# Patient Record
Sex: Male | Born: 1935 | Race: Black or African American | Hispanic: No | Marital: Married | Smoking: Former smoker
Health system: Southern US, Community
[De-identification: ages and names within clinical notes are randomized; demographics above are authoritative.]

## PROBLEM LIST (undated history)

## (undated) DIAGNOSIS — I739 Peripheral vascular disease, unspecified: Secondary | ICD-10-CM

## (undated) DIAGNOSIS — C61 Malignant neoplasm of prostate: Secondary | ICD-10-CM

## (undated) DIAGNOSIS — I10 Essential (primary) hypertension: Secondary | ICD-10-CM

## (undated) DIAGNOSIS — D509 Iron deficiency anemia, unspecified: Secondary | ICD-10-CM

## (undated) DIAGNOSIS — I453 Trifascicular block: Secondary | ICD-10-CM

## (undated) DIAGNOSIS — W19XXXA Unspecified fall, initial encounter: Secondary | ICD-10-CM

## (undated) DIAGNOSIS — E538 Deficiency of other specified B group vitamins: Secondary | ICD-10-CM

## (undated) DIAGNOSIS — I5032 Chronic diastolic (congestive) heart failure: Secondary | ICD-10-CM

## (undated) DIAGNOSIS — E039 Hypothyroidism, unspecified: Secondary | ICD-10-CM

## (undated) DIAGNOSIS — R296 Repeated falls: Secondary | ICD-10-CM

## (undated) HISTORY — PX: CHOLECYSTECTOMY: SHX55

## (undated) HISTORY — DX: Malignant neoplasm of prostate: C61

## (undated) HISTORY — DX: Hypothyroidism, unspecified: E03.9

## (undated) HISTORY — DX: Peripheral vascular disease, unspecified: I73.9

## (undated) HISTORY — DX: Essential (primary) hypertension: I10

## (undated) HISTORY — PX: HIP SURGERY: SHX245

---

## 1998-08-27 ENCOUNTER — Encounter: Payer: Self-pay | Admitting: Endocrinology

## 1998-08-27 ENCOUNTER — Ambulatory Visit (HOSPITAL_COMMUNITY): Admission: RE | Admit: 1998-08-27 | Discharge: 1998-08-27 | Payer: Self-pay | Admitting: Endocrinology

## 1998-09-02 ENCOUNTER — Ambulatory Visit: Admission: RE | Admit: 1998-09-02 | Discharge: 1998-09-02 | Payer: Self-pay | Admitting: Specialist

## 1998-09-02 ENCOUNTER — Encounter: Payer: Self-pay | Admitting: Specialist

## 1998-09-03 ENCOUNTER — Ambulatory Visit (HOSPITAL_COMMUNITY): Admission: RE | Admit: 1998-09-03 | Discharge: 1998-09-03 | Payer: Self-pay | Admitting: Specialist

## 1998-09-03 ENCOUNTER — Encounter: Payer: Self-pay | Admitting: Specialist

## 1999-08-20 ENCOUNTER — Observation Stay (HOSPITAL_COMMUNITY): Admission: RE | Admit: 1999-08-20 | Discharge: 1999-08-21 | Payer: Self-pay | Admitting: Specialist

## 2000-03-28 ENCOUNTER — Encounter: Payer: Self-pay | Admitting: Orthopedic Surgery

## 2000-04-04 ENCOUNTER — Inpatient Hospital Stay (HOSPITAL_COMMUNITY): Admission: RE | Admit: 2000-04-04 | Discharge: 2000-04-09 | Payer: Self-pay | Admitting: Orthopedic Surgery

## 2000-04-04 ENCOUNTER — Encounter: Payer: Self-pay | Admitting: Orthopedic Surgery

## 2000-11-23 ENCOUNTER — Emergency Department (HOSPITAL_COMMUNITY): Admission: EM | Admit: 2000-11-23 | Discharge: 2000-11-23 | Payer: Self-pay | Admitting: Emergency Medicine

## 2000-11-23 ENCOUNTER — Encounter: Payer: Self-pay | Admitting: Emergency Medicine

## 2004-03-31 ENCOUNTER — Encounter: Admission: RE | Admit: 2004-03-31 | Discharge: 2004-03-31 | Payer: Self-pay | Admitting: Orthopedic Surgery

## 2005-03-09 ENCOUNTER — Ambulatory Visit: Payer: Self-pay | Admitting: Internal Medicine

## 2005-03-09 ENCOUNTER — Ambulatory Visit: Payer: Self-pay | Admitting: Cardiology

## 2005-03-19 ENCOUNTER — Ambulatory Visit (HOSPITAL_COMMUNITY): Admission: RE | Admit: 2005-03-19 | Discharge: 2005-03-19 | Payer: Self-pay | Admitting: Internal Medicine

## 2005-05-05 ENCOUNTER — Ambulatory Visit: Admission: RE | Admit: 2005-05-05 | Discharge: 2005-06-08 | Payer: Self-pay | Admitting: Radiation Oncology

## 2005-06-02 ENCOUNTER — Ambulatory Visit: Payer: Self-pay | Admitting: Internal Medicine

## 2005-07-07 ENCOUNTER — Ambulatory Visit: Admission: RE | Admit: 2005-07-07 | Discharge: 2005-10-05 | Payer: Self-pay | Admitting: Radiation Oncology

## 2005-08-27 ENCOUNTER — Ambulatory Visit: Payer: Self-pay | Admitting: Internal Medicine

## 2005-10-06 ENCOUNTER — Ambulatory Visit: Admission: RE | Admit: 2005-10-06 | Discharge: 2005-12-06 | Payer: Self-pay | Admitting: Radiation Oncology

## 2006-02-14 ENCOUNTER — Ambulatory Visit: Payer: Self-pay | Admitting: Internal Medicine

## 2006-04-13 ENCOUNTER — Ambulatory Visit: Payer: Self-pay | Admitting: Internal Medicine

## 2006-05-16 ENCOUNTER — Ambulatory Visit: Payer: Self-pay | Admitting: Internal Medicine

## 2006-08-18 ENCOUNTER — Ambulatory Visit: Payer: Self-pay | Admitting: Internal Medicine

## 2006-10-11 ENCOUNTER — Ambulatory Visit: Payer: Self-pay | Admitting: Internal Medicine

## 2006-10-11 LAB — CONVERTED CEMR LAB
ALT: 37 units/L (ref 0–40)
AST: 37 units/L (ref 0–37)
Albumin: 3.7 g/dL (ref 3.5–5.2)
Alkaline Phosphatase: 59 units/L (ref 39–117)
BUN: 23 mg/dL (ref 6–23)
Basophils Absolute: 0 10*3/uL (ref 0.0–0.1)
Basophils Relative: 0.8 % (ref 0.0–1.0)
CO2: 26 meq/L (ref 19–32)
Calcium: 9.5 mg/dL (ref 8.4–10.5)
Chloride: 108 meq/L (ref 96–112)
Creatinine, Ser: 1.6 mg/dL — ABNORMAL HIGH (ref 0.4–1.5)
Eosinophil percent: 4.7 % (ref 0.0–5.0)
GFR calc non Af Amer: 45 mL/min
Glomerular Filtration Rate, Af Am: 55 mL/min/{1.73_m2}
Glucose, Bld: 113 mg/dL — ABNORMAL HIGH (ref 70–99)
HCT: 30.7 % — ABNORMAL LOW (ref 39.0–52.0)
Hemoglobin: 9.6 g/dL — ABNORMAL LOW (ref 13.0–17.0)
Lymphocytes Relative: 26.3 % (ref 12.0–46.0)
MCHC: 31.2 g/dL (ref 30.0–36.0)
MCV: 80 fL (ref 78.0–100.0)
Monocytes Absolute: 0.6 10*3/uL (ref 0.2–0.7)
Monocytes Relative: 10.4 % (ref 3.0–11.0)
Neutro Abs: 3.1 10*3/uL (ref 1.4–7.7)
Neutrophils Relative %: 57.8 % (ref 43.0–77.0)
Platelets: 249 10*3/uL (ref 150–400)
Potassium: 4.6 meq/L (ref 3.5–5.1)
RBC: 3.83 M/uL — ABNORMAL LOW (ref 4.22–5.81)
RDW: 14.1 % (ref 11.5–14.6)
Sodium: 140 meq/L (ref 135–145)
TSH: 3.36 microintl units/mL (ref 0.35–5.50)
Total Bilirubin: 1 mg/dL (ref 0.3–1.2)
Total Protein: 7.3 g/dL (ref 6.0–8.3)
WBC: 5.3 10*3/uL (ref 4.5–10.5)

## 2006-10-26 ENCOUNTER — Ambulatory Visit: Payer: Self-pay

## 2006-10-26 ENCOUNTER — Encounter: Payer: Self-pay | Admitting: Cardiology

## 2006-10-27 ENCOUNTER — Emergency Department (HOSPITAL_COMMUNITY): Admission: EM | Admit: 2006-10-27 | Discharge: 2006-10-27 | Payer: Self-pay | Admitting: Emergency Medicine

## 2006-11-03 ENCOUNTER — Ambulatory Visit: Payer: Self-pay | Admitting: Internal Medicine

## 2006-11-04 ENCOUNTER — Ambulatory Visit: Payer: Self-pay | Admitting: Internal Medicine

## 2006-11-04 LAB — CONVERTED CEMR LAB
Basophils Absolute: 0 10*3/uL (ref 0.0–0.1)
Basophils Relative: 1 % (ref 0.0–1.0)
Eosinophil percent: 2.6 % (ref 0.0–5.0)
Folate: >20 ng/mL
H Pylori IgG: POSITIVE — AB
HCT: 30.6 % — ABNORMAL LOW (ref 39.0–52.0)
Hemoglobin: 9.8 g/dL — ABNORMAL LOW (ref 13.0–17.0)
Iron: 34 ug/dL — ABNORMAL LOW (ref 42–165)
Lymphocytes Relative: 32.1 % (ref 12.0–46.0)
MCHC: 32 g/dL (ref 30.0–36.0)
MCV: 80.2 fL (ref 78.0–100.0)
Monocytes Absolute: 0.4 10*3/uL (ref 0.2–0.7)
Monocytes Relative: 11.1 % — ABNORMAL HIGH (ref 3.0–11.0)
Neutro Abs: 2 10*3/uL (ref 1.4–7.7)
Neutrophils Relative %: 53.2 % (ref 43.0–77.0)
Platelets: 242 10*3/uL (ref 150–400)
RBC: 3.82 M/uL — ABNORMAL LOW (ref 4.22–5.81)
RDW: 14.3 % (ref 11.5–14.6)
Saturation Ratios: 7.1 % — ABNORMAL LOW (ref 20.0–50.0)
Transferrin: 340 mg/dL (ref >=212.0)
Vitamin B-12: 478 pg/mL (ref 211–911)
WBC: 3.7 10*3/uL — ABNORMAL LOW (ref 4.5–10.5)

## 2006-11-15 ENCOUNTER — Ambulatory Visit: Payer: Self-pay | Admitting: Gastroenterology

## 2006-11-16 ENCOUNTER — Ambulatory Visit: Payer: Self-pay | Admitting: Cardiology

## 2006-11-16 ENCOUNTER — Ambulatory Visit: Payer: Self-pay | Admitting: Gastroenterology

## 2006-11-22 ENCOUNTER — Ambulatory Visit: Payer: Self-pay | Admitting: Cardiology

## 2006-11-25 ENCOUNTER — Ambulatory Visit: Payer: Self-pay

## 2006-11-28 ENCOUNTER — Ambulatory Visit: Payer: Self-pay | Admitting: Gastroenterology

## 2006-11-28 LAB — CONVERTED CEMR LAB
Fecal Occult Blood: NEGATIVE
OCCULT 1: NEGATIVE
OCCULT 2: NEGATIVE
OCCULT 3: NEGATIVE
OCCULT 4: NEGATIVE
OCCULT 5: NEGATIVE

## 2006-11-29 ENCOUNTER — Encounter (INDEPENDENT_AMBULATORY_CARE_PROVIDER_SITE_OTHER): Payer: Self-pay | Admitting: *Deleted

## 2006-11-29 ENCOUNTER — Ambulatory Visit (HOSPITAL_COMMUNITY): Admission: RE | Admit: 2006-11-29 | Discharge: 2006-11-30 | Payer: Self-pay | Admitting: General Surgery

## 2006-12-19 ENCOUNTER — Ambulatory Visit: Payer: Self-pay | Admitting: Gastroenterology

## 2006-12-19 LAB — CONVERTED CEMR LAB
Basophils Absolute: 0 10*3/uL (ref 0.0–0.1)
Basophils Relative: 0.7 % (ref 0.0–1.0)
Eosinophils Absolute: 0.1 10*3/uL (ref 0.0–0.6)
Eosinophils Relative: 2.8 % (ref 0.0–5.0)
HCT: 37 % — ABNORMAL LOW (ref 39.0–52.0)
Hemoglobin: 12.3 g/dL — ABNORMAL LOW (ref 13.0–17.0)
Iron: 177 ug/dL — ABNORMAL HIGH (ref 42–165)
Lymphocytes Relative: 36.3 % (ref 12.0–46.0)
MCHC: 33.2 g/dL (ref 30.0–36.0)
MCV: 83 fL (ref 78.0–100.0)
Monocytes Absolute: 0.5 10*3/uL (ref 0.2–0.7)
Monocytes Relative: 12.5 % — ABNORMAL HIGH (ref 3.0–11.0)
Neutro Abs: 1.9 10*3/uL (ref 1.4–7.7)
Neutrophils Relative %: 47.7 % (ref 43.0–77.0)
Platelets: 204 10*3/uL (ref 150–400)
RBC: 4.46 M/uL (ref 4.22–5.81)
RDW: 19.8 % — ABNORMAL HIGH (ref 11.5–14.6)
Saturation Ratios: 46.4 % (ref 20.0–50.0)
Transferrin: 272.6 mg/dL (ref >=212.0)
WBC: 3.9 10*3/uL — ABNORMAL LOW (ref 4.5–10.5)

## 2006-12-29 ENCOUNTER — Ambulatory Visit: Payer: Self-pay | Admitting: Cardiology

## 2007-01-17 ENCOUNTER — Ambulatory Visit: Payer: Self-pay | Admitting: Internal Medicine

## 2007-01-25 ENCOUNTER — Ambulatory Visit: Payer: Self-pay

## 2007-01-25 ENCOUNTER — Ambulatory Visit: Payer: Self-pay | Admitting: Cardiology

## 2007-01-31 ENCOUNTER — Encounter (INDEPENDENT_AMBULATORY_CARE_PROVIDER_SITE_OTHER): Payer: Self-pay | Admitting: *Deleted

## 2007-01-31 ENCOUNTER — Ambulatory Visit (HOSPITAL_BASED_OUTPATIENT_CLINIC_OR_DEPARTMENT_OTHER): Admission: RE | Admit: 2007-01-31 | Discharge: 2007-01-31 | Payer: Self-pay | Admitting: Otolaryngology

## 2007-03-15 ENCOUNTER — Encounter: Payer: Self-pay | Admitting: Cardiovascular Disease

## 2007-03-15 ENCOUNTER — Ambulatory Visit: Payer: Self-pay

## 2007-03-24 ENCOUNTER — Ambulatory Visit: Payer: Self-pay | Admitting: Gastroenterology

## 2007-03-24 LAB — CONVERTED CEMR LAB
Basophils Absolute: 0 10*3/uL (ref 0.0–0.1)
Basophils Relative: 0.5 % (ref 0.0–1.0)
Eosinophils Absolute: 0.1 10*3/uL (ref 0.0–0.6)
Eosinophils Relative: 2.9 % (ref 0.0–5.0)
HCT: 38.3 % — ABNORMAL LOW (ref 39.0–52.0)
Hemoglobin: 13.1 g/dL (ref 13.0–17.0)
Lymphocytes Relative: 36.6 % (ref 12.0–46.0)
MCHC: 34.1 g/dL (ref 30.0–36.0)
MCV: 91.1 fL (ref 78.0–100.0)
Monocytes Absolute: 0.4 10*3/uL (ref 0.2–0.7)
Monocytes Relative: 9.4 % (ref 3.0–11.0)
Neutro Abs: 2 10*3/uL (ref 1.4–7.7)
Neutrophils Relative %: 50.6 % (ref 43.0–77.0)
Platelets: 165 10*3/uL (ref 150–400)
RBC: 4.2 M/uL — ABNORMAL LOW (ref 4.22–5.81)
RDW: 13.6 % (ref 11.5–14.6)
WBC: 3.9 10*3/uL — ABNORMAL LOW (ref 4.5–10.5)

## 2007-04-28 ENCOUNTER — Ambulatory Visit: Payer: Self-pay | Admitting: Internal Medicine

## 2007-06-06 ENCOUNTER — Ambulatory Visit: Payer: Self-pay | Admitting: Cardiology

## 2007-06-13 IMAGING — RF DG CHOLANGIOGRAM OPERATIVE
1 series · 4 of 4 positions shown · non-contrast
Comparison: none

CLINICAL DATA: Cholelithiasis

[Series 1: run · 4 of 74 frames shown]
[frame 12/74]
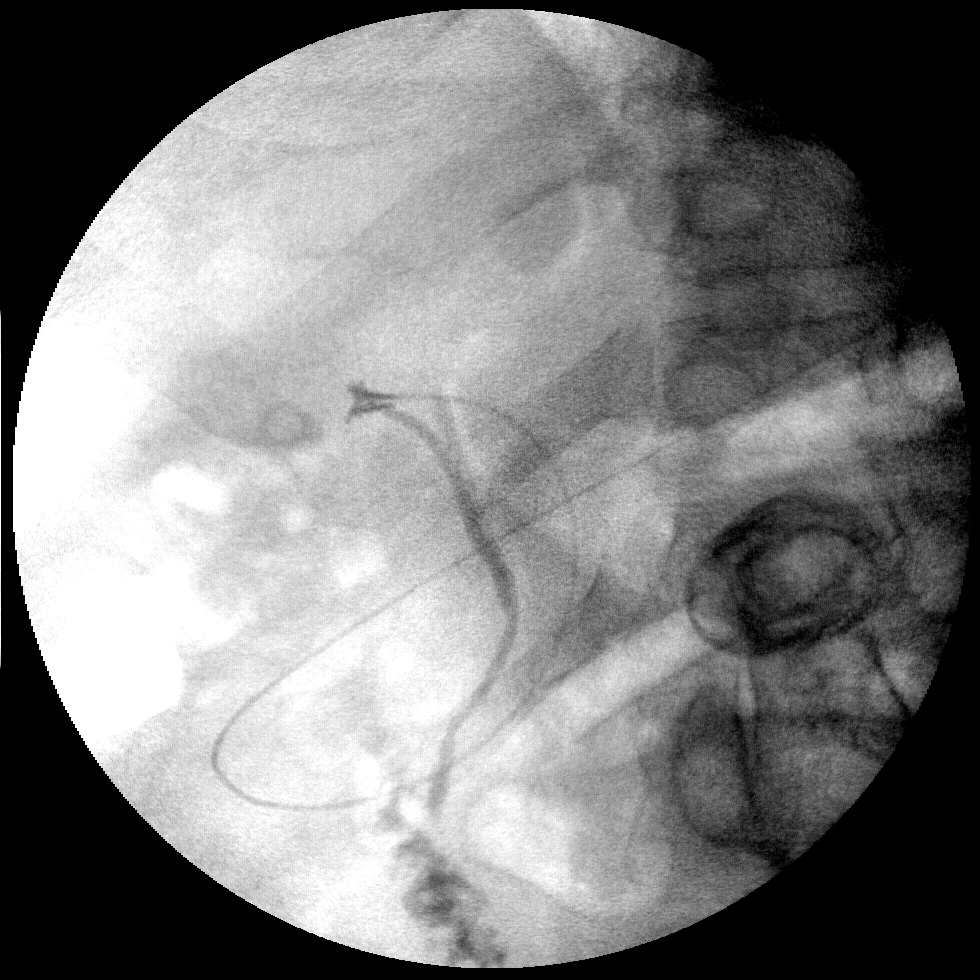
[frame 38/74]
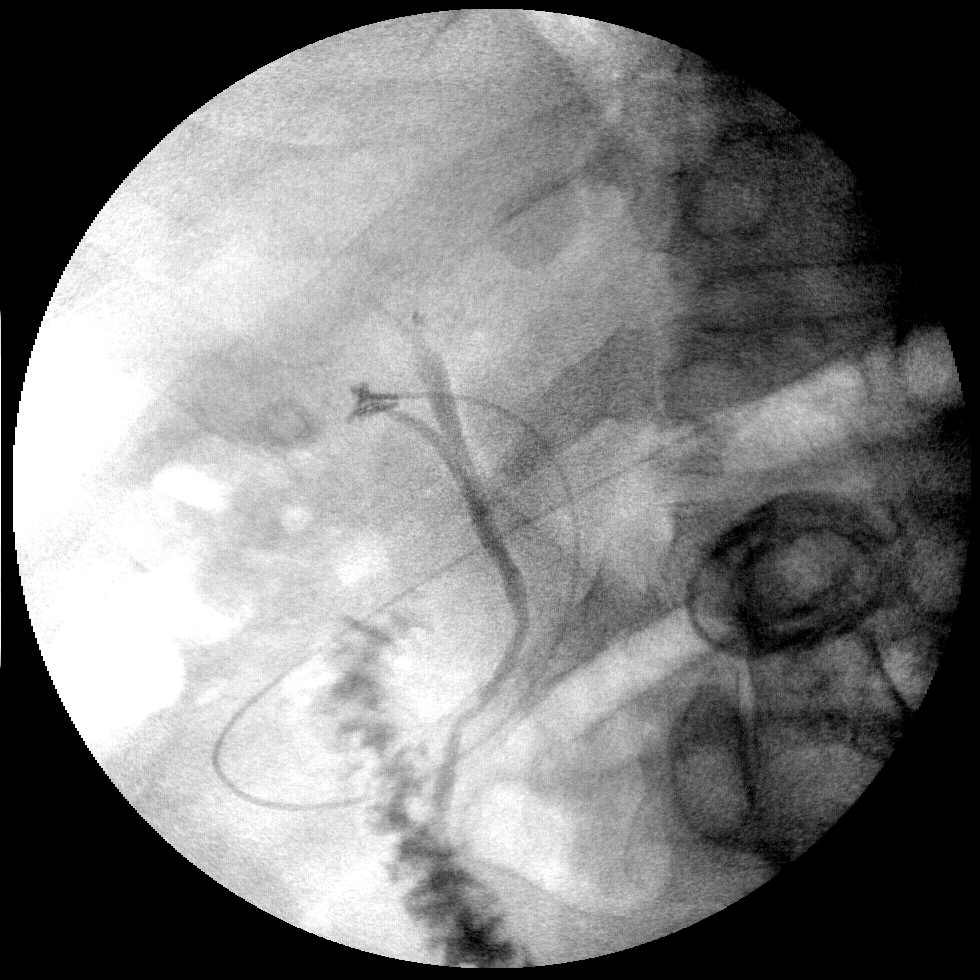
[frame 61/74]
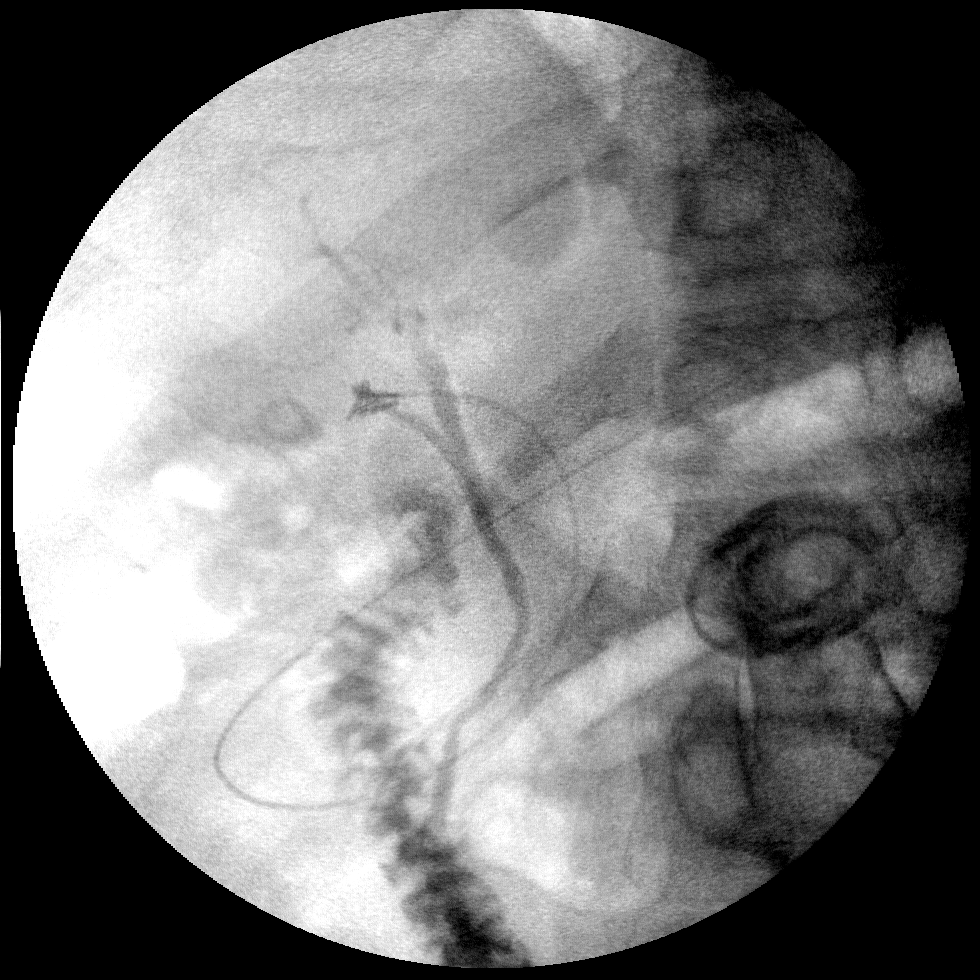
[frame 63/74]
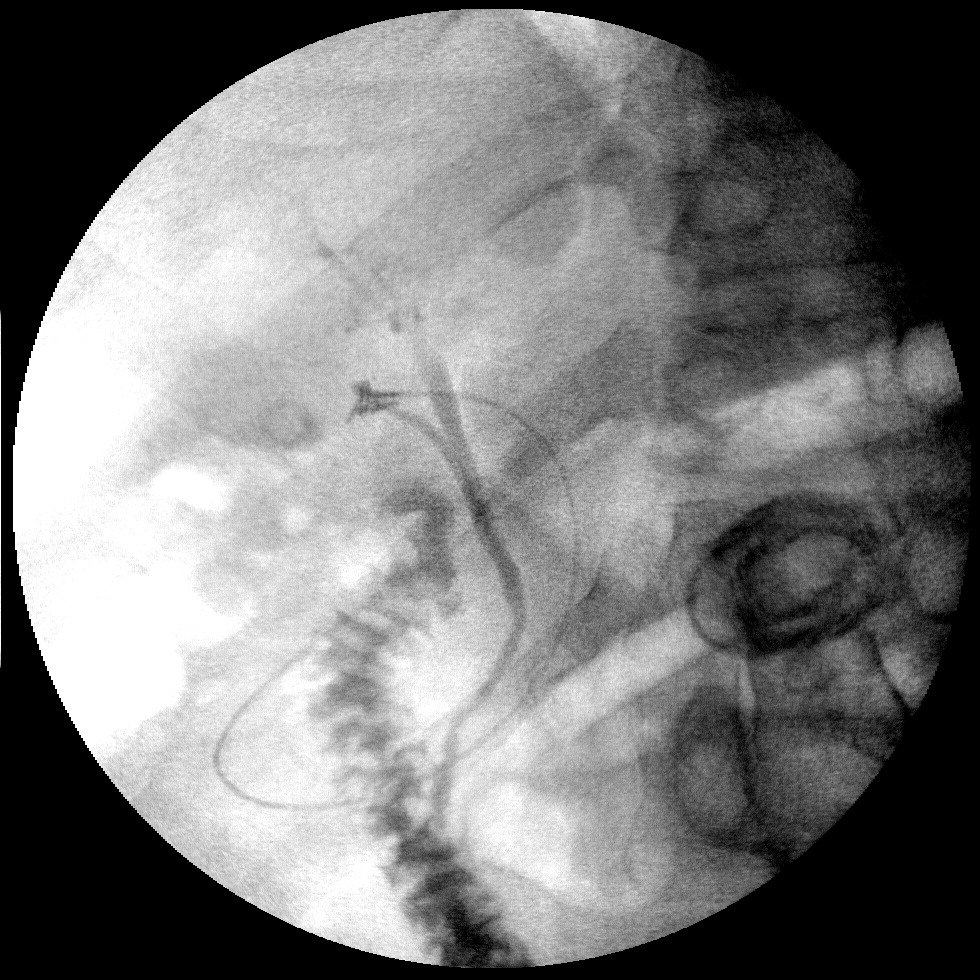

[4 of 4 positions shown; findings below may reference images not displayed]

INTRAOPERATIVE CHOLANGIOGRAM:

74  images from intraoperative C-arm fluoroscopy demonstrate  opacification of
the common bile duct. No filling defects to suggest retained stones. There is
incomplete evaluation of intrahepatic biliary tree, which appears decompressed
centrally. Contrast appears to flow on into decompressed duodenum.
IMPRESSION: 1. Negative for retained common duct stone

## 2007-06-30 ENCOUNTER — Ambulatory Visit: Payer: Self-pay | Admitting: Internal Medicine

## 2007-08-10 ENCOUNTER — Ambulatory Visit: Payer: Self-pay | Admitting: Internal Medicine

## 2007-09-05 ENCOUNTER — Ambulatory Visit: Payer: Self-pay | Admitting: Internal Medicine

## 2007-09-05 DIAGNOSIS — R220 Localized swelling, mass and lump, head: Secondary | ICD-10-CM | POA: Insufficient documentation

## 2007-09-05 DIAGNOSIS — I498 Other specified cardiac arrhythmias: Secondary | ICD-10-CM | POA: Insufficient documentation

## 2007-09-05 DIAGNOSIS — R81 Glycosuria: Secondary | ICD-10-CM | POA: Insufficient documentation

## 2007-09-05 DIAGNOSIS — D509 Iron deficiency anemia, unspecified: Secondary | ICD-10-CM | POA: Insufficient documentation

## 2007-09-05 DIAGNOSIS — R221 Localized swelling, mass and lump, neck: Secondary | ICD-10-CM

## 2007-09-05 DIAGNOSIS — Z8679 Personal history of other diseases of the circulatory system: Secondary | ICD-10-CM | POA: Insufficient documentation

## 2007-09-05 DIAGNOSIS — Z87898 Personal history of other specified conditions: Secondary | ICD-10-CM | POA: Insufficient documentation

## 2007-09-05 DIAGNOSIS — K219 Gastro-esophageal reflux disease without esophagitis: Secondary | ICD-10-CM | POA: Insufficient documentation

## 2007-09-05 DIAGNOSIS — E785 Hyperlipidemia, unspecified: Secondary | ICD-10-CM | POA: Insufficient documentation

## 2007-09-05 DIAGNOSIS — C61 Malignant neoplasm of prostate: Secondary | ICD-10-CM | POA: Insufficient documentation

## 2007-09-05 DIAGNOSIS — E039 Hypothyroidism, unspecified: Secondary | ICD-10-CM | POA: Insufficient documentation

## 2007-09-05 DIAGNOSIS — F411 Generalized anxiety disorder: Secondary | ICD-10-CM | POA: Insufficient documentation

## 2007-09-05 DIAGNOSIS — G43009 Migraine without aura, not intractable, without status migrainosus: Secondary | ICD-10-CM | POA: Insufficient documentation

## 2007-09-05 DIAGNOSIS — F528 Other sexual dysfunction not due to a substance or known physiological condition: Secondary | ICD-10-CM | POA: Insufficient documentation

## 2007-09-05 DIAGNOSIS — Z8639 Personal history of other endocrine, nutritional and metabolic disease: Secondary | ICD-10-CM | POA: Insufficient documentation

## 2007-09-05 DIAGNOSIS — F329 Major depressive disorder, single episode, unspecified: Secondary | ICD-10-CM | POA: Insufficient documentation

## 2007-09-05 DIAGNOSIS — I10 Essential (primary) hypertension: Secondary | ICD-10-CM | POA: Insufficient documentation

## 2007-09-05 DIAGNOSIS — Z862 Personal history of diseases of the blood and blood-forming organs and certain disorders involving the immune mechanism: Secondary | ICD-10-CM

## 2007-09-05 DIAGNOSIS — R6882 Decreased libido: Secondary | ICD-10-CM | POA: Insufficient documentation

## 2007-09-05 LAB — CONVERTED CEMR LAB
ALT: 53 units/L (ref 0–53)
AST: 49 units/L — ABNORMAL HIGH (ref 0–37)
Albumin: 4.3 g/dL (ref 3.5–5.2)
Alkaline Phosphatase: 52 units/L (ref 39–117)
BUN: 13 mg/dL (ref 6–23)
Basophils Absolute: 0 10*3/uL (ref 0.0–0.1)
Basophils Relative: 0.8 % (ref 0.0–1.0)
Bilirubin Urine: NEGATIVE
Bilirubin, Direct: 0.2 mg/dL (ref 0.0–0.3)
CO2: 28 meq/L (ref 19–32)
Calcium: 9.7 mg/dL (ref 8.4–10.5)
Chloride: 103 meq/L (ref 96–112)
Cholesterol: 163 mg/dL (ref 0–200)
Creatinine, Ser: 1.3 mg/dL (ref 0.4–1.5)
Crystals: NEGATIVE
Eosinophils Absolute: 0.2 10*3/uL (ref 0.0–0.6)
Eosinophils Relative: 3.4 % (ref 0.0–5.0)
GFR calc Af Amer: 70 mL/min
GFR calc non Af Amer: 58 mL/min
Glucose, Bld: 125 mg/dL — ABNORMAL HIGH (ref 70–99)
HCT: 41 % (ref 39.0–52.0)
HDL: 48.8 mg/dL (ref >39.0)
Hemoglobin, Urine: NEGATIVE
Hemoglobin: 14.5 g/dL (ref 13.0–17.0)
Hgb A1c MFr Bld: 6.6 % — ABNORMAL HIGH (ref 4.6–6.0)
Ketones, ur: NEGATIVE mg/dL
LDL Cholesterol: 95 mg/dL (ref 0–99)
Leukocytes, UA: NEGATIVE
Lymphocytes Relative: 36 % (ref 12.0–46.0)
MCHC: 35.4 g/dL (ref 30.0–36.0)
MCV: 93.1 fL (ref 78.0–100.0)
Monocytes Absolute: 0.4 10*3/uL (ref 0.2–0.7)
Monocytes Relative: 9.3 % (ref 3.0–11.0)
Mucus, UA: NEGATIVE
Neutro Abs: 2.3 10*3/uL (ref 1.4–7.7)
Neutrophils Relative %: 50.5 % (ref 43.0–77.0)
Nitrite: NEGATIVE
Platelets: 186 10*3/uL (ref 150–400)
Potassium: 4.5 meq/L (ref 3.5–5.1)
RBC / HPF: NONE SEEN
RBC: 4.41 M/uL (ref 4.22–5.81)
RDW: 12.5 % (ref 11.5–14.6)
Sodium: 139 meq/L (ref 135–145)
Specific Gravity, Urine: 1.015 (ref 1.000–1.03)
TSH: 2.95 microintl units/mL (ref 0.35–5.50)
Testosterone: 54.82 ng/dL — ABNORMAL LOW (ref 350.00–890)
Total Bilirubin: 1.4 mg/dL — ABNORMAL HIGH (ref 0.3–1.2)
Total CHOL/HDL Ratio: 3.3
Total Protein, Urine: NEGATIVE mg/dL
Total Protein: 7.7 g/dL (ref 6.0–8.3)
Triglycerides: 98 mg/dL (ref 0–149)
Urine Glucose: NEGATIVE mg/dL
Urobilinogen, UA: 0.2 (ref 0.0–1.0)
VLDL: 20 mg/dL (ref 0–40)
WBC: 4.6 10*3/uL (ref 4.5–10.5)
pH: 7 (ref 5.0–8.0)

## 2007-09-06 ENCOUNTER — Encounter: Payer: Self-pay | Admitting: Internal Medicine

## 2007-12-21 ENCOUNTER — Ambulatory Visit: Payer: Self-pay | Admitting: Cardiology

## 2008-03-05 ENCOUNTER — Ambulatory Visit: Payer: Self-pay | Admitting: Internal Medicine

## 2008-03-05 DIAGNOSIS — H900 Conductive hearing loss, bilateral: Secondary | ICD-10-CM | POA: Insufficient documentation

## 2008-03-05 DIAGNOSIS — E119 Type 2 diabetes mellitus without complications: Secondary | ICD-10-CM | POA: Insufficient documentation

## 2008-03-05 DIAGNOSIS — J309 Allergic rhinitis, unspecified: Secondary | ICD-10-CM | POA: Insufficient documentation

## 2008-03-05 DIAGNOSIS — E291 Testicular hypofunction: Secondary | ICD-10-CM | POA: Insufficient documentation

## 2008-04-12 ENCOUNTER — Encounter: Payer: Self-pay | Admitting: Internal Medicine

## 2008-08-13 ENCOUNTER — Ambulatory Visit: Payer: Self-pay | Admitting: Internal Medicine

## 2008-08-23 ENCOUNTER — Encounter: Payer: Self-pay | Admitting: Internal Medicine

## 2008-12-04 ENCOUNTER — Ambulatory Visit: Payer: Self-pay | Admitting: Cardiology

## 2009-02-21 ENCOUNTER — Encounter: Payer: Self-pay | Admitting: Internal Medicine

## 2009-05-09 ENCOUNTER — Encounter: Payer: Self-pay | Admitting: Cardiology

## 2009-08-25 ENCOUNTER — Ambulatory Visit: Payer: Self-pay | Admitting: Internal Medicine

## 2009-08-25 DIAGNOSIS — R5381 Other malaise: Secondary | ICD-10-CM | POA: Insufficient documentation

## 2009-08-25 DIAGNOSIS — M25519 Pain in unspecified shoulder: Secondary | ICD-10-CM | POA: Insufficient documentation

## 2009-08-25 DIAGNOSIS — R5383 Other fatigue: Secondary | ICD-10-CM

## 2009-08-28 DIAGNOSIS — R42 Dizziness and giddiness: Secondary | ICD-10-CM | POA: Insufficient documentation

## 2009-08-29 ENCOUNTER — Ambulatory Visit: Payer: Self-pay

## 2009-08-29 ENCOUNTER — Ambulatory Visit: Payer: Self-pay | Admitting: Cardiology

## 2009-09-16 ENCOUNTER — Ambulatory Visit: Payer: Self-pay | Admitting: Internal Medicine

## 2009-09-16 LAB — CONVERTED CEMR LAB: TSH: 2.44 microintl units/mL (ref 0.35–5.50)

## 2009-09-17 ENCOUNTER — Encounter (INDEPENDENT_AMBULATORY_CARE_PROVIDER_SITE_OTHER): Payer: Self-pay | Admitting: *Deleted

## 2009-11-19 ENCOUNTER — Encounter: Payer: Self-pay | Admitting: Internal Medicine

## 2010-02-03 ENCOUNTER — Ambulatory Visit: Payer: Self-pay | Admitting: Internal Medicine

## 2010-02-03 ENCOUNTER — Encounter: Payer: Self-pay | Admitting: Cardiology

## 2010-02-24 ENCOUNTER — Ambulatory Visit: Payer: Self-pay | Admitting: Cardiology

## 2010-03-11 ENCOUNTER — Ambulatory Visit: Payer: Self-pay | Admitting: Internal Medicine

## 2010-03-12 ENCOUNTER — Encounter: Payer: Self-pay | Admitting: Internal Medicine

## 2010-04-17 ENCOUNTER — Encounter: Payer: Self-pay | Admitting: Internal Medicine

## 2010-11-22 ENCOUNTER — Encounter: Payer: Self-pay | Admitting: Internal Medicine

## 2010-11-29 LAB — CONVERTED CEMR LAB
ALT: 28 units/L (ref 0–53)
AST: 29 units/L (ref 0–37)
Albumin: 4.3 g/dL (ref 3.5–5.2)
Alkaline Phosphatase: 48 units/L (ref 39–117)
BUN: 17 mg/dL (ref 6–23)
Basophils Absolute: 0.3 10*3/uL — ABNORMAL HIGH (ref 0.0–0.1)
Basophils Relative: 6.4 % — ABNORMAL HIGH (ref 0.0–3.0)
Bilirubin Urine: NEGATIVE
Bilirubin, Direct: 0.2 mg/dL (ref 0.0–0.3)
CO2: 28 meq/L (ref 19–32)
Calcium: 9.1 mg/dL (ref 8.4–10.5)
Chloride: 104 meq/L (ref 96–112)
Cholesterol: 157 mg/dL (ref 0–200)
Creatinine, Ser: 1.3 mg/dL (ref 0.4–1.5)
Eosinophils Absolute: 0.2 10*3/uL (ref 0.0–0.7)
Eosinophils Relative: 3.9 % (ref 0.0–5.0)
Folate: >20 ng/mL
GFR calc non Af Amer: 69.21 mL/min (ref >60)
Glucose, Bld: 86 mg/dL (ref 70–99)
HCT: 44 % (ref 39.0–52.0)
HDL: 48 mg/dL (ref >39.00)
Hemoglobin, Urine: NEGATIVE
Hemoglobin: 15 g/dL (ref 13.0–17.0)
Hgb A1c MFr Bld: 5.7 % (ref 4.6–6.5)
Iron: 73 ug/dL (ref 42–165)
Ketones, ur: NEGATIVE mg/dL
LDL Cholesterol: 88 mg/dL (ref 0–99)
Leukocytes, UA: NEGATIVE
Lymphocytes Relative: 34.4 % (ref 12.0–46.0)
Lymphs Abs: 1.4 10*3/uL (ref 0.7–4.0)
MCHC: 34 g/dL (ref 30.0–36.0)
MCV: 99 fL (ref 78.0–100.0)
Monocytes Absolute: 0.2 10*3/uL (ref 0.1–1.0)
Monocytes Relative: 6.1 % (ref 3.0–12.0)
Neutro Abs: 1.9 10*3/uL (ref 1.4–7.7)
Neutrophils Relative %: 49.2 % (ref 43.0–77.0)
Nitrite: NEGATIVE
Platelets: 140 10*3/uL — ABNORMAL LOW (ref 150.0–400.0)
Potassium: 3.9 meq/L (ref 3.5–5.1)
RBC: 4.44 M/uL (ref 4.22–5.81)
RDW: 12.7 % (ref 11.5–14.6)
Saturation Ratios: 22.3 % (ref 20.0–50.0)
Sed Rate: 10 mm/hr (ref 0–22)
Sodium: 141 meq/L (ref 135–145)
Specific Gravity, Urine: 1.02 (ref 1.000–1.030)
TSH: 25.23 microintl units/mL — ABNORMAL HIGH (ref 0.35–5.50)
Total Bilirubin: 1.5 mg/dL — ABNORMAL HIGH (ref 0.3–1.2)
Total CHOL/HDL Ratio: 3
Total Protein, Urine: NEGATIVE mg/dL
Total Protein: 7.5 g/dL (ref 6.0–8.3)
Transferrin: 233.4 mg/dL (ref 212.0–360.0)
Triglycerides: 107 mg/dL (ref 0.0–149.0)
Urine Glucose: NEGATIVE mg/dL
Urobilinogen, UA: 0.2 (ref 0.0–1.0)
VLDL: 21.4 mg/dL (ref 0.0–40.0)
Vitamin B-12: 528 pg/mL (ref 211–911)
WBC: 4 10*3/uL — ABNORMAL LOW (ref 4.5–10.5)
pH: 6 (ref 5.0–8.0)

## 2010-12-01 NOTE — Assessment & Plan Note (Signed)
Summary: 6 month rov/sl  Medications Added SYNTHROID 125 MCG TABS (LEVOTHYROXINE SODIUM) 1 tab by mouth once daily TERAZOSIN HCL 5 MG CAPS (TERAZOSIN HCL) 1 tab by mouth at bedtime        CC:  dizziness and sob.  History of Present Illness: [name] is a very pleasant  gentleman who I have seen in the past for bradycardia.  A previous monitor has shown that his heart rate occasionally dips into the low 30s with a junctional rhythm, but he did demonstrate chronotropic competence on a previous treadmill.  He has seen  Dr. Ladona Ridgel previously and he felt that a pacemaker may be necessary in the future, but not at present. Previous Myoview in January of 2008 showed no scar or ischemia and his ejection fraction was 68%. An echocardiogram in May 2008 showed normal LV function, mild left atrial enlargement and mild mitral and tricuspid regurgitation. I last saw him in October of 2010. Repeat treadmill in October of 2010 and showed  chronotropic competence; HR increased to 112. Patient did have transient junctional rhythm/junctional tachycardia with exercise; no symptoms. Since I last saw him he has dyspnea and fatigue with activities relieved with rest. This appears to be somewhat worse compared to previous. It is not associated with chest pain. There is no orthopnea, PND, pedal edema or syncope. He occasionally has lightheadedness with standing suddenly but otherwise no dizziness.   Current Medications (verified): 1)  Citalopram Hydrobromide 10 Mg Tabs (Citalopram Hydrobromide) .Marland Kitchen.. 1 Tablet By Mouth Once A Day 2)  Lisinopril 10 Mg Tabs (Lisinopril) .... 1/2 Tab By Mouth Once Daily 3)  Synthroid 125 Mcg Tabs (Levothyroxine Sodium) .Marland Kitchen.. 1 Tab By Mouth Once Daily 4)  Iron Supplement 325 (65 Fe) Mg  Tabs (Ferrous Sulfate) .Marland Kitchen.. 1 By Mouth Qd 5)  Ecotrin 325 Mg  Tbec (Aspirin) .Marland Kitchen.. 1 By Mouth Qd 6)  Simvastatin 40 Mg  Tabs (Simvastatin) .... 1/2 By Mouth Qhs 7)  Alendronate Sodium 70 Mg  Tabs (Alendronate  Sodium) .Marland Kitchen.. 1 By Mouth Q Wk 8)  Omeprazole 20 Mg  Tbec (Omeprazole) .Marland Kitchen.. 1 By Mouth Once Daily 9)  Dulcolax Stool Softener 100 Mg  Caps (Docusate Sodium) .Marland Kitchen.. 1 By Mouth Bid 10)  Fexofenadine Hcl 180 Mg  Tabs (Fexofenadine Hcl) .Marland Kitchen.. 1po Once Daily 11)  Centrum Silver  Tabs (Multiple Vitamins-Minerals) .Marland Kitchen.. 1 Tab By Mouth Once Daily 12)  Tramadol Hcl 50 Mg Tabs (Tramadol Hcl) .Marland Kitchen.. 1 - 2  By Mouth Q 6 Hrs As Needed For Pain 13)  Viagra 100 Mg Tabs (Sildenafil Citrate) .Marland Kitchen.. 1 By Mouth Once Daily As Needed 14)  Terazosin Hcl 5 Mg Caps (Terazosin Hcl) .Marland Kitchen.. 1 Tab By Mouth At Bedtime  Allergies: No Known Drug Allergies  Past History:  Past Medical History: Current Problems:  HYPERTENSION (ICD-401.9) HYPERLIPIDEMIA (ICD-272.4) BRADYCARDIA, CHRONIC (ICD-427.89) CONDUCTIVE HEARING LOSS BILATERAL (ICD-389.06) DIABETES MELLITUS, TYPE II (ICD-250.00) ALLERGIC RHINITIS (ICD-477.9) HYPOGONADISM, MALE (ICD-257.2) VOCAL CORD POLYP, HX OF (ICD-V13.8) COMMON MIGRAINE (ICD-346.10) GLUCOSE INTOLERANCE, HX OF (ICD-V12.2) ERECTILE DYSFUNCTION (ICD-302.72) GERD (ICD-530.81) DEPRESSION (ICD-311) ANXIETY (ICD-300.00) HYPOTHYROIDISM (ICD-244.9) NEOPLASM, MALIGNANT, PROSTATE (ICD-185)     Past Surgical History: Reviewed history from 08/28/2009 and no changes required. Cholecystectomy - 1/08 Rotator cuff repair right hip multiple surguries resulting in some shortnening of the leg Hemorrhoidectomy vocal cord polyp Inguinal herniorrhaphy  Excision of left submandibular gland.  Social History: Reviewed history from 03/05/2008 and no changes required. Former Smoker Alcohol use-no Married  Review of Systems  Fatigue and Arthralgias in the Left Shoulder but no fevers or chills, productive cough, hemoptysis, dysphasia, odynophagia, melena, hematochezia, dysuria, hematuria, rash, seizure activity, orthopnea, PND, pedal edema, claudication. Remaining systems are negative.   Vital  Signs:  Patient profile:   77 year old male Height:      75 inches Weight:      245 pounds BMI:     30.73 Pulse rate:   40 / minute Resp:     12 per minute BP sitting:   128 / 79  (left arm)  Vitals Entered By: Kem Parkinson (February 24, 2010 12:21 PM)  Physical Exam  General:  Well-developed well-nourished in no acute distress.  Skin is warm and dry.  HEENT is normal.  Neck is supple. No thyromegaly.  Chest is clear to auscultation with normal expansion.  Cardiovascular exam is bradycardic Abdominal exam nontender or distended. No masses palpated. Extremities show no edema. neuro grossly intact    EKG  Procedure date:  02/03/2010  Findings:      marked sinus bradycardia at a rate of 40. Right bundle branch block.  Impression & Recommendations:  Problem # 1:  BRADYCARDIA, CHRONIC (ICD-427.89) The patient does have a history of significant bradycardia. He is complaining of fatigue and dyspnea on exertion. His previous treadmill showed chronotropic competence but there was transient junctional tachycardia with loss of atrial kick. All asked Dr. Ladona Ridgel to see him again for consideration of pacemaker. I wonder if his exertional fatigue and dyspnea may be related to his bradycardia and loss of AV synchrony. Note his LV function is normal. His updated medication list for this problem includes:    Lisinopril 10 Mg Tabs (Lisinopril) .Marland Kitchen... 1/2 tab by mouth once daily    Ecotrin 325 Mg Tbec (Aspirin) .Marland Kitchen... 1 by mouth qd  Problem # 2:  HYPERTENSION (ICD-401.9) Blood pressure controlled on present medications. Will continue. Renal function monitored by primary care. His updated medication list for this problem includes:    Lisinopril 10 Mg Tabs (Lisinopril) .Marland Kitchen... 1/2 tab by mouth once daily    Ecotrin 325 Mg Tbec (Aspirin) .Marland Kitchen... 1 by mouth qd    Terazosin Hcl 5 Mg Caps (Terazosin hcl) .Marland Kitchen... 1 tab by mouth at bedtime  Problem # 3:  HYPERLIPIDEMIA (ICD-272.4) Continue statin.  Lipids and liver monitored by primary care. His updated medication list for this problem includes:    Simvastatin 40 Mg Tabs (Simvastatin) .Marland Kitchen... 1/2 by mouth qhs  Problem # 4:  DIABETES MELLITUS, TYPE II (ICD-250.00)  His updated medication list for this problem includes:    Lisinopril 10 Mg Tabs (Lisinopril) .Marland Kitchen... 1/2 tab by mouth once daily    Ecotrin 325 Mg Tbec (Aspirin) .Marland Kitchen... 1 by mouth qd  Problem # 5:  GERD (ICD-530.81)  His updated medication list for this problem includes:    Omeprazole 20 Mg Tbec (Omeprazole) .Marland Kitchen... 1 by mouth once daily  Problem # 6:  HYPOTHYROIDISM (ICD-244.9) Most recent TSH normal. His updated medication list for this problem includes:    Synthroid 125 Mcg Tabs (Levothyroxine sodium) .Marland Kitchen... 1 tab by mouth once daily  Patient Instructions: 1)  Your physician recommends that you schedule a follow-up appointment in: 9 months 2)  Your Md recommeds for you to make an appointment with Dr. Ladona Ridgel for Bradycardia.

## 2010-12-01 NOTE — Assessment & Plan Note (Signed)
Summary: FLU SHOT Natale Milch   Nurse Visit   Vital Signs:  Patient Profile:   77 Years Old Male Temp:     97.5 degrees F oral  Vitals Entered By: Windell Norfolk (August 13, 2008 10:57 AM)                 Prior Medications: CITALOPRAM HYDROBROMIDE 10 MG TABS (CITALOPRAM HYDROBROMIDE) 1 tablet by mouth once a day LISINOPRIL 10 MG TABS (LISINOPRIL) Take 1 tablet by mouth once a day SYNTHROID 125 MCG TABS (LEVOTHYROXINE SODIUM) Take 1 tablet by mouth once a day IRON SUPPLEMENT 325 (65 FE) MG  TABS (FERROUS SULFATE) 1 by mouth qd ECOTRIN 325 MG  TBEC (ASPIRIN) 1 by mouth qd SIMVASTATIN 40 MG  TABS (SIMVASTATIN) 1/2 by mouth qhs FLOMAX 0.4 MG  CP24 (TAMSULOSIN HCL) 1 by mouth qd ALENDRONATE SODIUM 70 MG  TABS (ALENDRONATE SODIUM) 1 by mouth q wk LEVITRA 20 MG  TABS (VARDENAFIL HCL) 1 by mouth prn OMEPRAZOLE 20 MG  TBEC (OMEPRAZOLE) 1 by mouth qd DULCOLAX STOOL SOFTENER 100 MG  CAPS (DOCUSATE SODIUM) 1 by mouth bid ANDROGEL 50 MG/5GM  GEL (TESTOSTERONE) use asd 1 pack/day FEXOFENADINE HCL 180 MG  TABS (FEXOFENADINE HCL) 1po qd CETIRIZINE HCL 10 MG  TABS (CETIRIZINE HCL) 1po once daily prn Current Allergies: No known allergies     Orders Added: 1)  Admin 1st Vaccine [90471] 2)  Flu Vaccine 107yrs + [16109]    Flu Vaccine Consent Questions     Do you have a history of severe allergic reactions to this vaccine? no    Any prior history of allergic reactions to egg and/or gelatin? no    Do you have a sensitivity to the preservative Thimersol? no    Do you have a past history of Guillan-Barre Syndrome? no    Do you currently have an acute febrile illness? no    Have you ever had a severe reaction to latex? no    Vaccine information given and explained to patient? yes    Are you currently pregnant? no    Lot Number:[id number]   Site Given  Left Deltoid IM] .opcflu

## 2010-12-01 NOTE — Letter (Signed)
[facility]   Imported By: Sherian Rein 04/29/2010 11:17:57  _____________________________________________________________________  External Attachment:    Type:   Image     Comment:   External Document

## 2010-12-01 NOTE — Letter (Signed)
Summary: [facility]   Imported By: Sherian Rein 11/25/2009 15:21:14  _____________________________________________________________________  External Attachment:    Type:   Image     Comment:   External Document

## 2010-12-01 NOTE — Assessment & Plan Note (Signed)
Summary: last by Shamekia Tippets 2008/bradycardia per crenshaw  Medications Added CITALOPRAM HYDROBROMIDE 10 MG TABS (CITALOPRAM HYDROBROMIDE) 1/2 tablet by mouth once a day LISINOPRIL 20 MG TABS (LISINOPRIL) Take 1/2 tablet by mouth daily VIAGRA 50 MG TABS (SILDENAFIL CITRATE) UAD as needed VITAMIN C 500 MG  TABS (ASCORBIC ACID) once daily      Allergies Added: NKDA  Visit Type:  Follow-up Primary Provider:  Corwin Levins MD   History of Present Illness: Mr. Mccartin returns today for followup.  I saw him several yrs ago when he was referred for asymptomatic bradycardia.  At that time I did not think his bradycardia was symptomatic.  He was seen back by Dr. Jens Som and had a holter monitor which demonstrated day time bradycardia.  His heart rates have been as low as the high 30's but with no prolonged pauses.  He denies syncope. 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  Current Medications (verified): 1)  Citalopram Hydrobromide 10 Mg Tabs (Citalopram Hydrobromide) .... 1/2 Tablet By Mouth Once A Day 2)  Lisinopril 20 Mg Tabs (Lisinopril) .... Take 1/2 Tablet By Mouth Daily 3)  Synthroid 125 Mcg Tabs (Levothyroxine Sodium) .Marland Kitchen.. 1 Tab By Mouth Once Daily 4)  Iron Supplement 325 (65 Fe) Mg  Tabs (Ferrous Sulfate) .Marland Kitchen.. 1 By Mouth Qd 5)  Ecotrin 325 Mg  Tbec (Aspirin) .Marland Kitchen.. 1 By Mouth Qd 6)  Simvastatin 40 Mg  Tabs (Simvastatin) .... 1/2 By Mouth Qhs 7)  Alendronate Sodium 70 Mg  Tabs (Alendronate Sodium) .Marland Kitchen.. 1 By Mouth Q Wk 8)  Omeprazole 20 Mg  Tbec (Omeprazole) .Marland Kitchen.. 1 By Mouth Once Daily 9)  Dulcolax Stool Softener 100 Mg  Caps (Docusate Sodium) .Marland Kitchen.. 1 By Mouth Bid 10)  Fexofenadine Hcl 180 Mg  Tabs (Fexofenadine Hcl) .Marland Kitchen.. 1po Once Daily 11)  Centrum Silver  Tabs (Multiple Vitamins-Minerals) .Marland Kitchen.. 1 Tab By Mouth Once Daily 12)  Tramadol Hcl 50 Mg Tabs (Tramadol Hcl) .Marland Kitchen.. 1 - 2  By Mouth Q 6 Hrs As Needed For Pain 13)   Viagra 50 Mg Tabs (Sildenafil Citrate) .... Uad As Needed 14)  Terazosin Hcl 5 Mg Caps (Terazosin Hcl) .Marland Kitchen.. 1 Tab By Mouth At Bedtime 15)  Vitamin C 500 Mg  Tabs (Ascorbic Acid) .... Once Daily  Allergies (verified): No Known Drug Allergies  Past History:  Past Medical History: Last updated: 02/24/2010 Current Problems:  HYPERTENSION (ICD-401.9) HYPERLIPIDEMIA (ICD-272.4) BRADYCARDIA, CHRONIC (ICD-427.89) CONDUCTIVE HEARING LOSS BILATERAL (ICD-389.06) DIABETES MELLITUS, TYPE II (ICD-250.00) ALLERGIC RHINITIS (ICD-477.9) HYPOGONADISM, MALE (ICD-257.2) VOCAL CORD POLYP, HX OF (ICD-V13.8) COMMON MIGRAINE (ICD-346.10) GLUCOSE INTOLERANCE, HX OF (ICD-V12.2) ERECTILE DYSFUNCTION (ICD-302.72) GERD (ICD-530.81) DEPRESSION (ICD-311) ANXIETY (ICD-300.00) HYPOTHYROIDISM (ICD-244.9) NEOPLASM, MALIGNANT, PROSTATE (ICD-185)     Past Surgical History: Last updated: 08/28/2009 Cholecystectomy - 1/08 Rotator cuff repair right hip multiple surguries resulting in some shortnening of the leg Hemorrhoidectomy vocal cord polyp Inguinal herniorrhaphy  Excision of left submandibular gland.  Family History: Last updated: 09/05/2007 twin brother with pacemake, prostate cancer, raynaud's d Family History Lung cancer - mother and father  Family History Diabetes 1st degree relative Family History Hypertension  Social History: Last updated: 03/05/2008 Former Smoker Alcohol use-no Married  Review of Systems       The patient complains of dyspnea on exertion.  The patient denies chest pain, syncope, and peripheral edema.    Vital Signs:  Patient profile:   77 year old  male Height:      75 inches Weight:      239 pounds BMI:     29.98 Pulse rate:   47 / minute BP sitting:   120 / 70  (left arm)  Vitals Entered By: Laurance Flatten CMA (Mar 11, 2010 10:16 AM)  Physical Exam  General:  Well-developed well-nourished in no acute distress.  Skin is warm and dry.  HEENT is normal.  Neck  is supple. No thyromegaly.  Chest is clear to auscultation with normal expansion.  Cardiovascular exam is bradycardic Abdominal exam nontender or distended. No masses palpated. Extremities show no edema. neuro grossly intact    Impression & Recommendations:  Problem # 1:  BRADYCARDIA, CHRONIC (ICD-427.89) today I had the patient walk around our office with me on pulse oximetry.  This demonstrated heart rates up into the 80's and no desaturation.  I have reviewed the results of his monitor with him and his wife.  I am not convinced his dizzyness will be improved by a PPM as I think it is mostly postural rather than brady mediated.  I will discuss this with Dr. Jens Som. His updated medication list for this problem includes:    Lisinopril 20 Mg Tabs (Lisinopril) .Marland Kitchen... Take 1/2 tablet by mouth daily    Ecotrin 325 Mg Tbec (Aspirin) .Marland Kitchen... 1 by mouth qd  Problem # 2:  HYPERTENSION (ICD-401.9) His blood pressure appears to be well controlled. A low sodium diet is requested. His updated medication list for this problem includes:    Lisinopril 20 Mg Tabs (Lisinopril) .Marland Kitchen... Take 1/2 tablet by mouth daily    Ecotrin 325 Mg Tbec (Aspirin) .Marland Kitchen... 1 by mouth qd    Terazosin Hcl 5 Mg Caps (Terazosin hcl) .Marland Kitchen... 1 tab by mouth at bedtime  Patient Instructions: 1)  Your physician recommends that you schedule a follow-up appointment in: 12 months with Dr Ladona Ridgel

## 2010-12-01 NOTE — Letter (Signed)
Summary: Appointment - Reminder 2  [facility]     [place], [place]    Phone:   Fax:      September 17, 2009 MRN: [id number]   [facility] [address] [place], [place]  [address]   Dear Mr. [facility],  Our records indicate that it is time to schedule a follow-up appointment.  Dr.Crenshaw recommended that you follow up with [place] in Feb 2011. It is very important that we reach you to schedule this appointment. We look forward to participating in your health care needs. Please contact us at the number listed above at your earliest convenience to schedule your appointment.  If you are unable to make an appointment at this time, give [place] a call so we can update our records.     Sincerely,     Lorne Skeens  [facility] Scheduling Team

## 2010-12-01 NOTE — Assessment & Plan Note (Signed)
Summary: LEFT SHOULDER PAIN/#/CD   Vital Signs:  Patient profile:   77 year old male Height:      75 inches Weight:      249.50 pounds BMI:     31.30 O2 Sat:      98 % on Room air Temp:     97.3 degrees F oral Pulse rate:   40 / minute BP sitting:   130 / 70  (right arm) Cuff size:   large  Vitals Entered ByZella Ball Ewing (February 03, 2010 10:48 AM)  O2 Flow:  Room air  CC: Left Shoulder pain/RE   CC:  Left Shoulder pain/RE.  History of Present Illness: here wtih 2 months persistent and worsening constant mild to mod left shoudler and upper arm  pain and decreased ROM ; no inciting event or heavy lifting;  no fall or other injury or trauma;  just seemed to be there one day;  similar to previous right shoudler situation before he had right rot cuff surgury;  Pt denies CP, sob, doe, wheezing, orthopnea, pnd, worsening LE edema, palps, dizziness or syncope   Pt denies new neuro symptoms such as headache, facial or extremity weakness   No neck or upper back pain.  Pt denies polydipsia, polyuria, or low sugar symptoms such as shakiness improved with eating.  Overall good compliance with meds, trying to follow low chol, DM diet, wt stable, little excercise however .  Asks for viagra samples and rx due to ED symtpoms worsening in the past 6 mo, wife with him concurs.  Problems Prior to Update: 1)  Shoulder Pain, Left  (ICD-719.41) 2)  Hypertension  (ICD-401.9) 3)  Hyperlipidemia  (ICD-272.4) 4)  Bradycardia, Chronic  (ICD-427.89) 5)  Fatigue  (ICD-780.79) 6)  Conductive Hearing Loss Bilateral  (ICD-389.06) 7)  Diabetes Mellitus, Type II  (ICD-250.00) 8)  Allergic Rhinitis  (ICD-477.9) 9)  Hypogonadism, Male  (ICD-257.2) 10)  Family History Diabetes 1st Degree Relative  (ICD-V18.0) 11)  Vocal Cord Polyp, Hx of  (ICD-V13.8) 12)  Common Migraine  (ICD-346.10) 13)  Glucose Intolerance, Hx of  (ICD-V12.2) 14)  Erectile Dysfunction  (ICD-302.72) 15)  Gerd  (ICD-530.81) 16)  Mitral Valve  Prolapse, Hx of  (ICD-V12.50) 17)  Depression  (ICD-311) 18)  Anxiety  (ICD-300.00) 19)  Hypothyroidism  (ICD-244.9) 20)  Neoplasm, Malignant, Prostate  (ICD-185) 21)  Anemia-iron Deficiency  (ICD-280.9) 22)  Swelling Mass or Lump in Head and Neck  (ICD-784.2) 23)  Decreased Libido  (ICD-799.81) 24)  Preventive Health Care  (ICD-V70.0) 25)  Glycosuria  (ICD-791.5) 26)  Dizziness  (ICD-780.4) 27)  Shoulder Pain, Left  (ICD-719.41)  Medications Prior to Update: 1)  Citalopram Hydrobromide 10 Mg Tabs (Citalopram Hydrobromide) .Marland Kitchen.. 1 Tablet By Mouth Once A Day 2)  Lisinopril 10 Mg Tabs (Lisinopril) .... 1/2 Tab By Mouth Once Daily 3)  Synthroid 150 Mcg Tabs (Levothyroxine Sodium) .Marland Kitchen.. 1 Po Once Daily 4)  Iron Supplement 325 (65 Fe) Mg  Tabs (Ferrous Sulfate) .Marland Kitchen.. 1 By Mouth Qd 5)  Ecotrin 325 Mg  Tbec (Aspirin) .Marland Kitchen.. 1 By Mouth Qd 6)  Simvastatin 40 Mg  Tabs (Simvastatin) .... 1/2 By Mouth Qhs 7)  Alendronate Sodium 70 Mg  Tabs (Alendronate Sodium) .Marland Kitchen.. 1 By Mouth Q Wk 8)  Omeprazole 20 Mg  Tbec (Omeprazole) .Marland Kitchen.. 1 By Mouth Once Daily 9)  Dulcolax Stool Softener 100 Mg  Caps (Docusate Sodium) .Marland Kitchen.. 1 By Mouth Bid 10)  Fexofenadine Hcl 180 Mg  Tabs (Fexofenadine  Hcl) .... 1po Once Daily 11)  Centrum Silver  Tabs (Multiple Vitamins-Minerals) .Marland Kitchen.. 1 Tab By Mouth Once Daily  Current Medications (verified): 1)  Citalopram Hydrobromide 10 Mg Tabs (Citalopram Hydrobromide) .Marland Kitchen.. 1 Tablet By Mouth Once A Day 2)  Lisinopril 10 Mg Tabs (Lisinopril) .... 1/2 Tab By Mouth Once Daily 3)  Synthroid 150 Mcg Tabs (Levothyroxine Sodium) .Marland Kitchen.. 1 Po Once Daily 4)  Iron Supplement 325 (65 Fe) Mg  Tabs (Ferrous Sulfate) .Marland Kitchen.. 1 By Mouth Qd 5)  Ecotrin 325 Mg  Tbec (Aspirin) .Marland Kitchen.. 1 By Mouth Qd 6)  Simvastatin 40 Mg  Tabs (Simvastatin) .... 1/2 By Mouth Qhs 7)  Alendronate Sodium 70 Mg  Tabs (Alendronate Sodium) .Marland Kitchen.. 1 By Mouth Q Wk 8)  Omeprazole 20 Mg  Tbec (Omeprazole) .Marland Kitchen.. 1 By Mouth Once Daily 9)  Dulcolax  Stool Softener 100 Mg  Caps (Docusate Sodium) .Marland Kitchen.. 1 By Mouth Bid 10)  Fexofenadine Hcl 180 Mg  Tabs (Fexofenadine Hcl) .Marland Kitchen.. 1po Once Daily 11)  Centrum Silver  Tabs (Multiple Vitamins-Minerals) .Marland Kitchen.. 1 Tab By Mouth Once Daily 12)  Tramadol Hcl 50 Mg Tabs (Tramadol Hcl) .Marland Kitchen.. 1 - 2  By Mouth Q 6 Hrs As Needed For Pain 13)  Viagra 100 Mg Tabs (Sildenafil Citrate) .Marland Kitchen.. 1 By Mouth Once Daily As Needed  Allergies (verified): No Known Drug Allergies  Past History:  Past Medical History: Last updated: 08/29/2009 Current Problems:  HYPERTENSION (ICD-401.9) HYPERLIPIDEMIA (ICD-272.4) BRADYCARDIA, CHRONIC (ICD-427.89) CONDUCTIVE HEARING LOSS BILATERAL (ICD-389.06) DIABETES MELLITUS, TYPE II (ICD-250.00) ALLERGIC RHINITIS (ICD-477.9) HYPOGONADISM, MALE (ICD-257.2) VOCAL CORD POLYP, HX OF (ICD-V13.8) COMMON MIGRAINE (ICD-346.10) GLUCOSE INTOLERANCE, HX OF (ICD-V12.2) ERECTILE DYSFUNCTION (ICD-302.72) GERD (ICD-530.81) DEPRESSION (ICD-311) ANXIETY (ICD-300.00) HYPOTHYROIDISM (ICD-244.9) NEOPLASM, MALIGNANT, PROSTATE (ICD-185)  History of iron-deficiency anemia.   Rare palpitations.      Past Surgical History: Last updated: 08/28/2009 Cholecystectomy - 1/08 Rotator cuff repair right hip multiple surguries resulting in some shortnening of the leg Hemorrhoidectomy vocal cord polyp Inguinal herniorrhaphy  Excision of left submandibular gland.  Social History: Last updated: 03/05/2008 Former Smoker Alcohol use-no Married  Risk Factors: Smoking Status: quit (09/05/2007)  Review of Systems       all otherwise negative per pt -    Physical Exam  General:  alert and overweight-appearing.   Head:  normocephalic and atraumatic.   Eyes:  vision grossly intact, pupils equal, and pupils round.   Ears:  R ear normal and L ear normal.   Nose:  no external deformity and no nasal discharge.   Mouth:  no gingival abnormalities and pharynx pink and moist.   Neck:  supple and no masses.    Lungs:  normal respiratory effort and normal breath sounds.   Heart:  normal rate and regular rhythm.   Genitalia:  deferred Msk:  left shoulder with mild rot cuff area tender but no swelling or bursa area tender;  forward elev and abduction to 90 degrees only  Extremities:  no edema, no erythema  Neurologic:  cranial nerves II-XII intact, strength normal in all extremities, and sensation intact to light touch.     Impression & Recommendations:  Problem # 1:  SHOULDER PAIN, LEFT (ICD-719.41)  His updated medication list for this problem includes:    Ecotrin 325 Mg Tbec (Aspirin) .Marland Kitchen... 1 by mouth qd    Tramadol Hcl 50 Mg Tabs (Tramadol hcl) .Marland Kitchen... 1 - 2  by mouth q 6 hrs as needed for pain and upper arm discomfort, no CP - ecg  reviewed; c/w prob rotater cuff tear - for MRI and ortho/dr aluisio per pt request  Orders: EKG w/ Interpretation (93000) Orthopedic Surgeon Referral (Ortho Surgeon) Radiology Referral (Radiology)  Problem # 2:  ERECTILE DYSFUNCTION (ICD-302.72)  His updated medication list for this problem includes:    Viagra 100 Mg Tabs (Sildenafil citrate) .Marland Kitchen... 1 by mouth once daily as needed treat as above, f/u any worsening signs or symptoms   Problem # 3:  DIABETES MELLITUS, TYPE II (ICD-250.00)  His updated medication list for this problem includes:    Lisinopril 10 Mg Tabs (Lisinopril) .Marland Kitchen... 1/2 tab by mouth once daily    Ecotrin 325 Mg Tbec (Aspirin) .Marland Kitchen... 1 by mouth qd  Labs Reviewed: Creat: 1.3 (08/25/2009)    Reviewed HgBA1c results: 5.7 (08/25/2009)  6.6 (09/05/2007) stable overall by hx and exam, ok to continue meds/tx as is   Problem # 4:  HYPERTENSION (ICD-401.9)  His updated medication list for this problem includes:    Lisinopril 10 Mg Tabs (Lisinopril) .Marland Kitchen... 1/2 tab by mouth once daily  BP today: 130/70 Prior BP: 145/70 (08/29/2009)  Prior 10 Yr Risk Heart Disease: 22 % (08/29/2009)  Labs Reviewed: K+: 3.9 (08/25/2009) Creat: : 1.3  (08/25/2009)   Chol: 157 (08/25/2009)   HDL: 48.00 (08/25/2009)   LDL: 88 (08/25/2009)   TG: 107.0 (08/25/2009) .stbale   Complete Medication List: 1)  Citalopram Hydrobromide 10 Mg Tabs (Citalopram hydrobromide) .Marland Kitchen.. 1 tablet by mouth once a day 2)  Lisinopril 10 Mg Tabs (Lisinopril) .... 1/2 tab by mouth once daily 3)  Synthroid 150 Mcg Tabs (Levothyroxine sodium) .Marland Kitchen.. 1 po once daily 4)  Iron Supplement 325 (65 Fe) Mg Tabs (Ferrous sulfate) .Marland Kitchen.. 1 by mouth qd 5)  Ecotrin 325 Mg Tbec (Aspirin) .Marland Kitchen.. 1 by mouth qd 6)  Simvastatin 40 Mg Tabs (Simvastatin) .... 1/2 by mouth qhs 7)  Alendronate Sodium 70 Mg Tabs (Alendronate sodium) .Marland Kitchen.. 1 by mouth q wk 8)  Omeprazole 20 Mg Tbec (Omeprazole) .Marland Kitchen.. 1 by mouth once daily 9)  Dulcolax Stool Softener 100 Mg Caps (Docusate sodium) .Marland Kitchen.. 1 by mouth bid 10)  Fexofenadine Hcl 180 Mg Tabs (Fexofenadine hcl) .Marland Kitchen.. 1po once daily 11)  Centrum Silver Tabs (Multiple vitamins-minerals) .Marland Kitchen.. 1 tab by mouth once daily 12)  Tramadol Hcl 50 Mg Tabs (Tramadol hcl) .Marland Kitchen.. 1 - 2  by mouth q 6 hrs as needed for pain 13)  Viagra 100 Mg Tabs (Sildenafil citrate) .Marland Kitchen.. 1 by mouth once daily as needed  Patient Instructions: 1)  Please take all new medications as prescribed 2)  Continue all previous medications as before this visit 3)  Your EKG was good today 4)  You will be contacted about the referral(s) to: MRI and Dr Lequita Halt for after Apr 15 5)  Please schedule a follow-up appointment in 6 months with CPX labs and:  6)  HbgA1C prior to visit, ICD-9: 790.2 Prescriptions: VIAGRA 100 MG TABS (SILDENAFIL CITRATE) 1 by mouth once daily as needed  #5 x 11   Entered and Authorized by:   Corwin Levins MD   Signed by:   Corwin Levins MD on 02/03/2010   Method used:   Print then Give to Patient   RxID:   [id number] TRAMADOL HCL 50 MG TABS (TRAMADOL HCL) 1 - 2  by mouth q 6 hrs as needed for pain  #120 x 1   Entered and Authorized by:   Corwin Levins MD   Signed by:  Corwin Levins MD on 02/03/2010   Method used:   Print then Give to Patient   RxID:   [id number]

## 2010-12-01 NOTE — Letter (Signed)
[facility]   Imported By: Lester [place] 02/28/2009 08:39:36  _____________________________________________________________________  External Attachment:    Type:   Image     Comment:   External Document

## 2010-12-01 NOTE — Assessment & Plan Note (Signed)
Summary: 6 MO ROV/NWS   Vital Signs:  Patient Profile:   77 Years Old Male Weight:      249 pounds Temp:     97.3 degrees F oral Pulse rate:   43 / minute BP sitting:   108 / 68  (right arm) Cuff size:   regular  Pt. in pain?   no  Vitals Entered By: Maris Berger (Mar 05, 2008 4:15 PM)                   Chief Complaint:  6 mon f/u.  History of Present Illness: gets meds at the [place] and see an MD there at least yearly; overall doing ok except for nasal allergic symptoms, clear drainage; also with some difficulty hearing bilat - apparently has some wax problem; also since last visit has seemed to have had some improvement in E.D. function although he is now interested in the tx for the low testosterone - thinks he might be able to get it at the [place]; wt stable, compliant with meds, no polys or low sugars    Updated Prior Medication List: CITALOPRAM HYDROBROMIDE 10 MG TABS (CITALOPRAM HYDROBROMIDE) 1 tablet by mouth once a day LISINOPRIL 10 MG TABS (LISINOPRIL) Take 1 tablet by mouth once a day SYNTHROID 125 MCG TABS (LEVOTHYROXINE SODIUM) Take 1 tablet by mouth once a day IRON SUPPLEMENT 325 (65 FE) MG  TABS (FERROUS SULFATE) 1 by mouth qd ECOTRIN 325 MG  TBEC (ASPIRIN) 1 by mouth qd SIMVASTATIN 40 MG  TABS (SIMVASTATIN) 1/2 by mouth qhs FLOMAX 0.4 MG  CP24 (TAMSULOSIN HCL) 1 by mouth qd ALENDRONATE SODIUM 70 MG  TABS (ALENDRONATE SODIUM) 1 by mouth q wk LEVITRA 20 MG  TABS (VARDENAFIL HCL) 1 by mouth prn OMEPRAZOLE 20 MG  TBEC (OMEPRAZOLE) 1 by mouth qd DULCOLAX STOOL SOFTENER 100 MG  CAPS (DOCUSATE SODIUM) 1 by mouth bid ANDROGEL 50 MG/5GM  GEL (TESTOSTERONE) use asd 1 pack/day FEXOFENADINE HCL 180 MG  TABS (FEXOFENADINE HCL) 1po qd CETIRIZINE HCL 10 MG  TABS (CETIRIZINE HCL) 1po once daily prn  Current Allergies (reviewed today): No known allergies   Past Medical History:    Reviewed history from 09/05/2007 and no changes required:       chronic bradycardia       Hyperlipidemia       Hypertension       Anemia-iron deficiency       prostate cancer       Hypothyroidism       Anxiety       Depression       Mitral Valve Prolapse       GERD       E.D       migraine       hypogonadism       Allergic rhinitis       Diabetes mellitus, type II  Past Surgical History:    Reviewed history from 09/05/2007 and no changes required:       Cholecystectomy - 1/08       Rotator cuff repair       right hip multiple surguries resulting in some shortnening of the leg       Hemorrhoidectomy       vocal cord polyp       Inguinal herniorrhaphy   Family History:    Reviewed history from 09/05/2007 and no changes required:       twin brother with pacemake, prostate cancer, raynaud's  d       Family History Lung cancer - mother and father        Family History Diabetes 1st degree relative       Family History Hypertension  Social History:    Reviewed history from 09/05/2007 and no changes required:       Former Smoker       Alcohol use-no       Married    Review of Systems  The patient denies anorexia, fever, weight loss, weight gain, vision loss, decreased hearing, hoarseness, chest pain, syncope, dyspnea on exhertion, peripheral edema, prolonged cough, hemoptysis, abdominal pain, melena, hematochezia, severe indigestion/heartburn, hematuria, incontinence, muscle weakness, suspicious skin lesions, transient blindness, difficulty walking, depression, unusual weight change, abnormal bleeding, enlarged lymph nodes, and angioedema.         all otherwise negative    Physical Exam  General:     Well-developed,well-nourished,in no acute distress; alert,appropriate and cooperative throughout examination Head:     Normocephalic and atraumatic without obvious abnormalities. No apparent alopecia or balding. Eyes:     No corneal or conjunctival inflammation noted. EOMI. Perrla. Ears:     bilat tm's red after wax impactions removed bilat and hearing  restored Nose:     External nasal examination shows no deformity or inflammation. Nasal mucosa are pink and moist without lesions or exudates. Mouth:     Oral mucosa and oropharynx without lesions or exudates.  Teeth in good repair. Neck:     No deformities, masses, or tenderness noted. Lungs:     Normal respiratory effort, chest expands symmetrically. Lungs are clear to auscultation, no crackles or wheezes. Heart:     Normal rate and regular rhythm. S1 and S2 normal without gallop, murmur, click, rub or other extra sounds. Abdomen:     Bowel sounds positive,abdomen soft and non-tender without masses, organomegaly or hernias noted. Genitalia:     Testes bilaterally descended without nodularity, tenderness or masses. No scrotal masses or lesions. No penis lesions or urethral discharge. Msk:     No deformity or scoliosis noted of thoracic or lumbar spine.   Extremities:     No clubbing, cyanosis, edema, or deformity noted with normal full range of motion of all joints.   Neurologic:     No cranial nerve deficits noted. Station and gait are normal. Plantar reflexes are down-going bilaterally. DTRs are symmetrical throughout. Sensory, motor and coordinative functions appear intact.    Impression & Recommendations:  Problem # 1:  ALLERGIC RHINITIS (ICD-477.9)  His updated medication list for this problem includes:    Fexofenadine Hcl 180 Mg Tabs (Fexofenadine hcl) .Marland Kitchen... 1po qd    Cetirizine Hcl 10 Mg Tabs (Cetirizine hcl) .Marland Kitchen... 1po once daily prn try one of the above, which ever is avail at the [place]  Problem # 2:  HYPOGONADISM, MALE (ICD-257.2) tx with androgel asd - given rx to present to the [place]  Problem # 3:  HYPERTENSION (ICD-401.9)  His updated medication list for this problem includes:    Lisinopril 10 Mg Tabs (Lisinopril) .Marland Kitchen... Take 1 tablet by mouth once a day  BP today: 108/68 Prior BP: 121/70 (09/05/2007)  Labs Reviewed: Creat: 1.3 (09/05/2007) Chol: 163 (09/05/2007)    HDL: 48.8 (09/05/2007)   LDL: 95 (09/05/2007)   TG: 98 (09/05/2007)  stable overall by hx and exam, ok to continue meds/tx as is   Problem # 4:  DIABETES MELLITUS, TYPE II (ICD-250.00)  His updated medication list for this problem includes:  Lisinopril 10 Mg Tabs (Lisinopril) .Marland Kitchen... Take 1 tablet by mouth once a day    Ecotrin 325 Mg Tbec (Aspirin) .Marland Kitchen... 1 by mouth qd  Labs Reviewed: HgBA1c: 6.6 (09/05/2007)   Creat: 1.3 (09/05/2007)     stable overall by hx and exam, ok to continue meds/tx as is ; cont diet only for now  Problem # 5:  HYPOTHYROIDISM (ICD-244.9)  His updated medication list for this problem includes:    Synthroid 125 Mcg Tabs (Levothyroxine sodium) .Marland Kitchen... Take 1 tablet by mouth once a day  Labs Reviewed: TSH: 2.95 (09/05/2007)    HgBA1c: 6.6 (09/05/2007) Chol: 163 (09/05/2007)   HDL: 48.8 (09/05/2007)   LDL: 95 (09/05/2007)   TG: 98 (09/05/2007)  treat as above, f/u any worsening signs or symptoms   Problem # 6:  CONDUCTIVE HEARING LOSS BILATERAL (ICD-389.06) resovled with wax irrigation Orders: Cerumen Impaction Removal (16109)   Complete Medication List: 1)  Citalopram Hydrobromide 10 Mg Tabs (Citalopram hydrobromide) .Marland Kitchen.. 1 tablet by mouth once a day 2)  Lisinopril 10 Mg Tabs (Lisinopril) .... Take 1 tablet by mouth once a day 3)  Synthroid 125 Mcg Tabs (Levothyroxine sodium) .... Take 1 tablet by mouth once a day 4)  Iron Supplement 325 (65 Fe) Mg Tabs (Ferrous sulfate) .Marland Kitchen.. 1 by mouth qd 5)  Ecotrin 325 Mg Tbec (Aspirin) .Marland Kitchen.. 1 by mouth qd 6)  Simvastatin 40 Mg Tabs (Simvastatin) .... 1/2 by mouth qhs 7)  Flomax 0.4 Mg Cp24 (Tamsulosin hcl) .Marland Kitchen.. 1 by mouth qd 8)  Alendronate Sodium 70 Mg Tabs (Alendronate sodium) .Marland Kitchen.. 1 by mouth q wk 9)  Levitra 20 Mg Tabs (Vardenafil hcl) .Marland Kitchen.. 1 by mouth prn 10)  Omeprazole 20 Mg Tbec (Omeprazole) .Marland Kitchen.. 1 by mouth qd 11)  Dulcolax Stool Softener 100 Mg Caps (Docusate sodium) .Marland Kitchen.. 1 by mouth bid 12)  Androgel 50  Mg/5gm Gel (Testosterone) .... Use asd 1 pack/day 13)  Fexofenadine Hcl 180 Mg Tabs (Fexofenadine hcl) .Marland Kitchen.. 1po qd 14)  Cetirizine Hcl 10 Mg Tabs (Cetirizine hcl) .Marland Kitchen.. 1po once daily prn   Patient Instructions: 1)  take all medications as prescribed 2)  continue all medications that you may have been taking previously 3)  Please schedule a follow-up appointment in 6 months with CPX labs and: 4)  HbgA1C prior to visit, ICD-9: 5)  Urine Microalbumin prior to visit, ICD-9:   Prescriptions: CETIRIZINE HCL 10 MG  TABS (CETIRIZINE HCL) 1po once daily prn  #90 x 3   Entered and Authorized by:   Corwin Levins MD   Signed by:   Corwin Levins MD on 03/05/2008   Method used:   Print then Give to Patient   RxID:   [id number] FEXOFENADINE HCL 180 MG  TABS (FEXOFENADINE HCL) 1po qd  #90 x 3   Entered and Authorized by:   Corwin Levins MD   Signed by:   Corwin Levins MD on 03/05/2008   Method used:   Print then Give to Patient   RxID:   [id number] ANDROGEL 50 MG/5GM  GEL (TESTOSTERONE) use asd 1 pack/day  #90 x 3   Entered and Authorized by:   Corwin Levins MD   Signed by:   Corwin Levins MD on 03/05/2008   Method used:   Print then Give to Patient   RxID:   [id number] ANDROGEL 50 MG/5GM  GEL (TESTOSTERONE) use asd 1 pack/day  #90 x 3   Entered and Authorized by:  Corwin Levins MD   Signed by:   Corwin Levins MD on 03/05/2008   Method used:   Print then Give to Patient   RxID:   [id number]  ]

## 2010-12-01 NOTE — Letter (Signed)
Summary: [facility]   Imported By: Lanelle Bal 09/03/2008 15:25:28  _____________________________________________________________________  External Attachment:    Type:   Image     Comment:   External Document

## 2010-12-01 NOTE — Letter (Signed)
Summary: [facility]   Imported By: Lester [place] 03/18/2010 09:13:45  _____________________________________________________________________  External Attachment:    Type:   Image     Comment:   External Document

## 2010-12-01 NOTE — Assessment & Plan Note (Signed)
Summary: SUGAR IN URINE PER UROLOGIST MD/NML  Medications Added CITALOPRAM HYDROBROMIDE 10 MG TABS (CITALOPRAM HYDROBROMIDE) 1 tablet by mouth once a day LISINOPRIL 10 MG TABS (LISINOPRIL) Take 1 tablet by mouth once a day PROTONIX 40 MG TBEC (PANTOPRAZOLE SODIUM) Take 1 tablet by mouth once a day SYNTHROID 125 MCG TABS (LEVOTHYROXINE SODIUM) Take 1 tablet by mouth once a day IRON SUPPLEMENT 325 (65 FE) MG  TABS (FERROUS SULFATE) 1 by mouth qd ECOTRIN 325 MG  TBEC (ASPIRIN) 1 by mouth qd SIMVASTATIN 40 MG  TABS (SIMVASTATIN) 1/2 by mouth qhs FLOMAX 0.4 MG  CP24 (TAMSULOSIN HCL) 1 by mouth qd ALENDRONATE SODIUM 70 MG  TABS (ALENDRONATE SODIUM) 1 by mouth q wk      Allergies Added: NKDA  Preventive Care Screening  Last Pneumovax:    Date:  09/01/2005    Next Due:  09/2010    Results:  given   Colonoscopy:    Date:  11/16/2006    Next Due:  12/2016    Results:  normal   Last Flu Shot:    Date:  08/31/2007    Results:  given    Vital Signs:  Patient Profile:   77 Years Old Male Weight:      257 pounds Temp:     97.1 degrees F oral Pulse rate:   46 / minute BP sitting:   121 / 70  (right arm)  Pt. in pain?   no  Vitals Entered By: Maris Berger (September 05, 2007 8:19 AM)                  History of Present Illness: saw urology - had small glucose in urine - wt increase about 15 lbs documeneted since last visitn with dietary excess.  Current Allergies (reviewed today): No known allergies  Updated/Current Medications (including changes made in today's visit):  CITALOPRAM HYDROBROMIDE 10 MG TABS (CITALOPRAM HYDROBROMIDE) 1 tablet by mouth once a day LISINOPRIL 10 MG TABS (LISINOPRIL) Take 1 tablet by mouth once a day PROTONIX 40 MG TBEC (PANTOPRAZOLE SODIUM) Take 1 tablet by mouth once a day SYNTHROID 125 MCG TABS (LEVOTHYROXINE SODIUM) Take 1 tablet by mouth once a day IRON SUPPLEMENT 325 (65 FE) MG  TABS (FERROUS SULFATE) 1 by mouth qd ECOTRIN 325  MG  TBEC (ASPIRIN) 1 by mouth qd SIMVASTATIN 40 MG  TABS (SIMVASTATIN) 1/2 by mouth qhs FLOMAX 0.4 MG  CP24 (TAMSULOSIN HCL) 1 by mouth qd ALENDRONATE SODIUM 70 MG  TABS (ALENDRONATE SODIUM) 1 by mouth q wk   Past Medical History:    Reviewed history and no changes required:       chronic bradycardia       Hyperlipidemia       Hypertension       Anemia-iron deficiency       prostate cancer       Hypothyroidism       Anxiety       Depression       Mitral Valve Prolapse       GERD       E.D       glucose intolerance       migraine  Past Surgical History:    Reviewed history and no changes required:       Cholecystectomy - 1/08       Rotator cuff repair       right hip multiple surguries resulting in some shortnening of the leg  Hemorrhoidectomy       vocal cord polyp       Inguinal herniorrhaphy   Family History:    Reviewed history and no changes required:       twin brother with pacemake, prostate cancer, raynaud's d       Family History Lung cancer - mother and father        Family History Diabetes 1st degree relative       Family History Hypertension  Social History:    Reviewed history and no changes required:       Former Smoker       Alcohol use-no   Risk Factors:  Tobacco use:  quit Alcohol use:  no  Colonoscopy History:     Date of Last Colonoscopy:  11/16/2006    Results:  normal    Review of Systems  The patient denies anorexia, fever, weight loss, vision loss, decreased hearing, hoarseness, chest pain, syncope, dyspnea on exhertion, peripheral edema, prolonged cough, hemoptysis, abdominal pain, melena, hematochezia, severe indigestion/heartburn, hematuria, incontinence, genital sores, muscle weakness, suspicious skin lesions, transient blindness, difficulty walking, depression, unusual weight change, abnormal bleeding, enlarged lymph nodes, angioedema, breast masses, and testicular masses.         gets some meds from the [place]   Physical Exam   General:     Well-developed,well-nourished,in no acute distress; alert,appropriate and cooperative throughout examination Head:     Normocephalic and atraumatic without obvious abnormalities. No apparent alopecia or balding. Eyes:     No corneal or conjunctival inflammation noted. EOMI. Perrla.  Ears:     External ear exam shows no significant lesions or deformities.  Otoscopic examination reveals clear canals, tympanic membranes are intact bilaterally without bulging, retraction, inflammation or discharge. Hearing is grossly normal bilaterally. Nose:     External nasal examination shows no deformity or inflammation. Nasal mucosa are pink and moist without lesions or exudates. Mouth:     Oral mucosa and oropharynx without lesions or exudates.  Teeth in good repair. Neck:     approx 1.5 cm subq submandibular mass noted Lungs:     Normal respiratory effort, chest expands symmetrically. Lungs are clear to auscultation, no crackles or wheezes. Heart:     Normal rate and regular rhythm. S1 and S2 normal without gallop, murmur, click, rub or other extra sounds. Abdomen:     Bowel sounds positive,abdomen soft and non-tender without masses, organomegaly or hernias noted. Msk:     No deformity or scoliosis noted of thoracic or lumbar spine.   Extremities:     No clubbing, cyanosis, edema, or deformity noted with normal full range of motion of all joints.   Neurologic:     No cranial nerve deficits noted. Station and gait are normal. Plantar reflexes are down-going bilaterally. DTRs are symmetrical throughout. Sensory, motor and coordinative functions appear intact.    Impression & Recommendations:  Problem # 1:  GLYCOSURIA (ICD-791.5) suggestive of new dm - check a1c,  may have been brought on by recent wt gain; urged to lose wt Orders: TLB-A1C / Hgb A1C (Glycohemoglobin) (83036-A1C)   Problem # 2:  Preventive Health Care (ICD-V70.0) overall o/w odoing well, up to date, declines  zostavax due to cost, counseled on routine health concerns, no med refills needed. will check routine labs only (PSA done per urology) Orders: TLB-Lipid Panel (80061-LIPID) TLB-BMP (Basic Metabolic Panel-BMET) (80048-METABOL) TLB-CBC Platelet - w/Differential (85025-CBCD) TLB-Hepatic/Liver Function Pnl (80076-HEPATIC) TLB-TSH (Thyroid Stimulating Hormone) (84443-TSH) TLB-Udip w/ Micro (81001-URINE)  Problem # 3:  DECREASED LIBIDO (ICD-799.81) likely related to mild depression  and /or recent leupron tx finished 4/08 for prostate ca; will check testosterone level but he is advised against HRT due to h/o prostate ca Orders: TLB-Testosterone, Total (84403-TESTO)   Problem # 4:  SWELLING MASS OR LUMP IN HEAD AND NECK (ICD-784.2) s/p left benign lesion 1/08, now with right submandibluar lesion apparently increasing in size of which the patient was unaware - will refer back to dr Narda Bonds for further evaluation Orders: ENT Referral (ENT)   Problem # 5:  BRADYCARDIA, CHRONIC (ICD-427.89)  His updated medication list for this problem includes:    Ecotrin 325 Mg Tbec (Aspirin) .Marland Kitchen... 1 by mouth qd benign - ok to follow  Complete Medication List: 1)  Citalopram Hydrobromide 10 Mg Tabs (Citalopram hydrobromide) .Marland Kitchen.. 1 tablet by mouth once a day 2)  Lisinopril 10 Mg Tabs (Lisinopril) .... Take 1 tablet by mouth once a day 3)  Protonix 40 Mg Tbec (Pantoprazole sodium) .... Take 1 tablet by mouth once a day 4)  Synthroid 125 Mcg Tabs (Levothyroxine sodium) .... Take 1 tablet by mouth once a day 5)  Iron Supplement 325 (65 Fe) Mg Tabs (Ferrous sulfate) .Marland Kitchen.. 1 by mouth qd 6)  Ecotrin 325 Mg Tbec (Aspirin) .Marland Kitchen.. 1 by mouth qd 7)  Simvastatin 40 Mg Tabs (Simvastatin) .... 1/2 by mouth qhs 8)  Flomax 0.4 Mg Cp24 (Tamsulosin hcl) .Marland Kitchen.. 1 by mouth qd 9)  Alendronate Sodium 70 Mg Tabs (Alendronate sodium) .Marland Kitchen.. 1 by mouth q wk     ]  Appended Document: Orders Update    Clinical Lists  Changes  Orders: Added new Service order of Est. Patient 65& > (30865) - Signed Added new Service order of EKG w/ Interpretation (93000) - Signed

## 2010-12-01 NOTE — Assessment & Plan Note (Signed)
Summary: appt 11:30/rov/chronic afib  Medications Added LISINOPRIL 10 MG TABS (LISINOPRIL) 1/2 tab by mouth once daily CENTRUM SILVER  TABS (MULTIPLE VITAMINS-MINERALS) 1 tab by mouth once daily        CC:  referal from Dr Jonny Ruiz since pt last saw him he has had no compliats.  History of Present Illness: [name] [name] is a very pleasant  gentleman who I have seen in the past for bradycardia.  A previous monitor has shown that his heart rate occasionally dips into the low 30s with a junctional rhythm, but he did demonstrate chronotropic competence on a previous treadmill.  He has seen  Dr. Ladona Ridgel previously and he felt that a pacemaker may be necessary in the future, but not at present. Previous Myoview in January of 2008 showed no scar or ischemia and his ejection fraction was 68%. An echocardiogram in May 2008 showed normal LV function, mild left atrial enlargement and mild mitral and tricuspid regurgitation. I last saw him in February of this year. He was recently seen by Dr. Jonny Ruiz and was noted to be bradycardic and also complaining of fatigue. However a TSH was checked and was 25. His Synthroid was increased and he feels now much better. Note he denies any dyspnea on exertion, orthopnea, PND, pedal edema, syncope or exertional chest pain. His fatigue has improved. He was having some dizziness with standing but that's also improved.  Current Medications (verified): 1)  Citalopram Hydrobromide 10 Mg Tabs (Citalopram Hydrobromide) .Marland Kitchen.. 1 Tablet By Mouth Once A Day 2)  Lisinopril 10 Mg Tabs (Lisinopril) .... 1/2 Tab By Mouth Once Daily 3)  Synthroid 150 Mcg Tabs (Levothyroxine Sodium) .Marland Kitchen.. 1 Po Once Daily 4)  Iron Supplement 325 (65 Fe) Mg  Tabs (Ferrous Sulfate) .Marland Kitchen.. 1 By Mouth Qd 5)  Ecotrin 325 Mg  Tbec (Aspirin) .Marland Kitchen.. 1 By Mouth Qd 6)  Simvastatin 40 Mg  Tabs (Simvastatin) .... 1/2 By Mouth Qhs 7)  Alendronate Sodium 70 Mg  Tabs (Alendronate Sodium) .Marland Kitchen.. 1 By Mouth Q Wk 8)  Omeprazole 20 Mg  Tbec  (Omeprazole) .Marland Kitchen.. 1 By Mouth Once Daily 9)  Dulcolax Stool Softener 100 Mg  Caps (Docusate Sodium) .Marland Kitchen.. 1 By Mouth Bid 10)  Fexofenadine Hcl 180 Mg  Tabs (Fexofenadine Hcl) .Marland Kitchen.. 1po Once Daily 11)  Centrum Silver  Tabs (Multiple Vitamins-Minerals) .Marland Kitchen.. 1 Tab By Mouth Once Daily  Allergies: No Known Drug Allergies  Past History:  Past Medical History: Current Problems:  HYPERTENSION (ICD-401.9) HYPERLIPIDEMIA (ICD-272.4) BRADYCARDIA, CHRONIC (ICD-427.89) CONDUCTIVE HEARING LOSS BILATERAL (ICD-389.06) DIABETES MELLITUS, TYPE II (ICD-250.00) ALLERGIC RHINITIS (ICD-477.9) HYPOGONADISM, MALE (ICD-257.2) VOCAL CORD POLYP, HX OF (ICD-V13.8) COMMON MIGRAINE (ICD-346.10) GLUCOSE INTOLERANCE, HX OF (ICD-V12.2) ERECTILE DYSFUNCTION (ICD-302.72) GERD (ICD-530.81) DEPRESSION (ICD-311) ANXIETY (ICD-300.00) HYPOTHYROIDISM (ICD-244.9) NEOPLASM, MALIGNANT, PROSTATE (ICD-185)  History of iron-deficiency anemia.   Rare palpitations.      Past Surgical History: Reviewed history from 08/28/2009 and no changes required. Cholecystectomy - 1/08 Rotator cuff repair right hip multiple surguries resulting in some shortnening of the leg Hemorrhoidectomy vocal cord polyp Inguinal herniorrhaphy  Excision of left submandibular gland.  Social History: Reviewed history from 03/05/2008 and no changes required. Former Smoker Alcohol use-no Married  Review of Systems       no fevers or chills, productive cough, hemoptysis, dysphasia, odynophagia, melena, hematochezia, dysuria, hematuria, rash, seizure activity, orthopnea, PND, pedal edema, claudication. Remaining systems are negative.   Vital Signs:  Patient profile:   77 year old male Height:  76 inches Weight:      245 pounds BMI:     29.93 Pulse rate:   44 / minute Resp:     12 per minute BP sitting:   131 / 74  (left arm)  Vitals Entered By: [name] [name] (August 29, 2009 11:10 AM)  Physical Exam  General:  well-developed  well-nourished in no acute distress. Skin is warm and dry. Head:  HEENT is normal. Neck:  supple with no bruits. No thyromegaly. Lungs:  clear to auscultation Heart:  bradycardic rate but regular rhythm. Abdomen:  soft and nontender. No masses palpated. Extremities:  no edema. Neurologic:  grossly intact.   EKG  Procedure date:  08/25/2009  Findings:      Marked sinus bradycardia at a rate of 39. Right bundle branch block.  Impression & Recommendations:  Problem # 1:  BRADYCARDIA, CHRONIC (ICD-427.89) Patient has chronic bradycardia. He was having fatigue and mild dizziness with standing but this has improved after increasing his Synthroid dose. Note his TSH was 25. I therefore am not convinced he is having symptoms from his bradycardia. I will schedule her for an exercise treadmill to demonstrate chronotropic competence. If so then we will most likely follow him expectantly. He may need a pacemaker in the future. His updated medication list for this problem includes:    Lisinopril 10 Mg Tabs (Lisinopril) .Marland Kitchen... 1/2 tab by mouth once daily    Ecotrin 325 Mg Tbec (Aspirin) .Marland Kitchen... 1 by mouth qd  Problem # 2:  HYPERLIPIDEMIA (ICD-272.4) Continue statin. Lipids and liver monitored by primary care. His updated medication list for this problem includes:    Simvastatin 40 Mg Tabs (Simvastatin) .Marland Kitchen... 1/2 by mouth qhs  Problem # 3:  HYPERTENSION (ICD-401.9) Blood pressure controlled on medications. Will continue. Renal function and potassium followed by his primary care. His updated medication list for this problem includes:    Lisinopril 10 Mg Tabs (Lisinopril) .Marland Kitchen... 1/2 tab by mouth once daily    Ecotrin 325 Mg Tbec (Aspirin) .Marland Kitchen... 1 by mouth qd  Problem # 4:  DIABETES MELLITUS, TYPE II (ICD-250.00)  His updated medication list for this problem includes:    Lisinopril 10 Mg Tabs (Lisinopril) .Marland Kitchen... 1/2 tab by mouth once daily    Ecotrin 325 Mg Tbec (Aspirin) .Marland Kitchen... 1 by mouth qd   Problem # 5:  GERD (ICD-530.81)  His updated medication list for this problem includes:    Omeprazole 20 Mg Tbec (Omeprazole) .Marland Kitchen... 1 by mouth once daily  Problem # 6:  HYPOTHYROIDISM (ICD-244.9) Management per primary care. His updated medication list for this problem includes:    Synthroid 150 Mcg Tabs (Levothyroxine sodium) .Marland Kitchen... 1 po once daily

## 2010-12-01 NOTE — Letter (Signed)
Summary: [facility]  [facility]   Imported By: Esmeralda Links D'jimraou 09/26/2007 13:30:40  _____________________________________________________________________  External Attachment:    Type:   Image     Comment:   External Document

## 2011-03-16 NOTE — Assessment & Plan Note (Signed)
[facility]                         ELECTROPHYSIOLOGY OFFICE NOTE   NAME:[name], [name] INTRIAGO                        MRN:          [id number]  DATE:06/30/2007                            DOB:          Jul 26, 1932    REFERRING PHYSICIAN:  Madolyn Frieze. Jens Som, MD, [facility]   77 is referred today by Dr. Olga Millers for consideration of  implantation of a permanent pacemaker.  I saw the patient remotely in  the past (greater than five years ago) and, at that time, he had a  history of bradycardia, but was otherwise fairly stable.  He had a  history of severe arthritis and hip replacement and, at that time, I  thought a period of watchful waiting was probably warranted, despite his  fairly significant sinus node dysfunction.  In the interim, the patient  and his wife note that his activity is less and he has dizziness, which  is his predominant symptom today.  His arthritic complaints are not as  severe, but I suspect this has been partly related to inactivity.  He  has had no frank syncope.  He denies chest pain.  He has rare  palpitations.  He wore a cardiac monitor and had a 2D echo done, which  demonstrates normal LV systolic function, no mitral valve prolapse or  other regurgitant lesions in his valves and no other arrhythmias.  A  treadmill test demonstrated a maximum heart rate of 114 beats per  minute.   Additional past medical history is notable for hypertension, severe  arthritis, epigastric discomfort and prostate problems.  Past medical history is notable for a history of migraines and multiple  hip surgeries, as previously noted.  He also has a history of mitral  valve prolapse, though he has no mitral regurgitation by 2D echo.   FAMILY HISTORY:  Negative for premature coronary artery disease.   SOCIAL HISTORY:  The patient is married and denies tobacco or ethanol  abuse.   REVIEW OF SYSTEMS:  As noted in the HPI, and otherwise all systems  are  reviewed and negative, except as noted above.   PHYSICAL EXAMINATION:  He is a pleasant, well-appearing, 77 year old  man, in no acute distress.  The blood pressure was 130/78, the pulse was 46 and regular, the  respirations were 18.  HEENT EXAM:  Normocephalic, atraumatic.  Pupils equal and round.  The  oropharynx is moist.  Sclerae anicteric.  NECK:  Revealed no jugular venous distention.  There is no thyromegaly.  Trachea is midline.  The carotids are 2+ and symmetric.  LUNGS:  Clear bilaterally to auscultation, no wheezes, rales or rhonchi  are present.  There is no increased work of breathing.  CARDIOVASCULAR EXAM:  Revealed a regular bradycardia with normal S1 and  S2.  I did not appreciate any murmurs, rubs or gallops.  The PMI was not  enlarged, nor was it laterally displaced.  ABDOMINAL EXAM:  Soft, nontender, nondistended.  There is no  organomegaly.  Bowel sounds are present.  No rebound or guarding.  EXTREMITIES:  Demonstrated no cyanosis, clubbing or edema.  The pulses  are 2+ and symmetric.  NEUROLOGIC EXAM:  He was alert and oriented times three.  His cranial  nerves are intact.  Strength is 5/5 and symmetric.   MEDICATIONS INCLUDE:  1. Multiple vitamins.  2. Fosamax.  3. Lisinopril 10 daily.  4. Zocor 40 mg a half tablet daily.  5. Synthroid.  6. Protonix 40 mg daily.  7. Aspirin 325 mg daily.   His EKG demonstrates sinus bradycardia with a right bundle branch block.   IMPRESSION:  1. Bradycardia.  2. Dizziness, questionably related to bradycardia.  3. Hypertension.  4. Rare palpitations.   DISCUSSION:  [name] [name] has clear-cut sinus bradycardia, though it is  unclear whether or not he is truly symptomatic from this or not.  I  discussed the treatment options with the patient in detail today.  The  patient might benefit from pacemaker implantation, but might not,  particularly in terms of improvement of his dizziness, and I have  notified both him and  his spouse of this.  It is interesting that I saw  him over five years ago and it does not appear that his bradycardia, at  least, has worsened.  I do suspect that he will end up requiring a  pacemaker in the long term, though in the short term a period of  watchful waiting is also reasonable.  If he has frank syncope or if his  heart rate decreases further, then I would indeed recommend pacing this  patient.  Also, if he has recurrent palpitations, which would suggest  paroxysms of atrial fibrillation, it would be important to document this  and with combination of rapid palpitations, as well as bradycardia,  pacemaker implantation for tachy/brady syndrome would be warranted.     Doylene Canning. Ladona Ridgel, MD  Electronically Signed    GWT/MedQ  DD: 06/30/2007  DT: 07/01/2007  Job #: [id number]

## 2011-03-16 NOTE — Assessment & Plan Note (Signed)
[facility]                            CARDIOLOGY OFFICE NOTE   NAME:[name] [name], [name] [name]                        MRN:          [id number]  DATE:06/06/2007                            DOB:          1932-04-04    SUBJECTIVE:  [name] [name] returns for followup today.  He is an extremely  pleasant gentleman who has a history of bradycardia and questionable  linear mass in his ventricle on echocardiogram.  We did repeat his  echocardiogram on Mar 15, 2007, and his LV function was normal.  There  was a prominent myocardial infarction-left ventricular band but there  was no mass or source of embolus noted, per Dr. Theron Arista C. Nishan.  Note:  He has also been bradycardic in the past.  Previous Myoview in January  showed no ischemia or infarction, and his ejection fraction was normal.  A CardioNet showed sinus bradycardia with an occasional junctional  rhythm and heart rates as low as the 30's; however, this was  predominantly when he was sleeping.  Note:  We did perform a treadmill  to look for chronotropic incompetence.  His heart rate increased to 114.  Since I last saw him, he does occasionally have some dyspnea on  exertion, but there is no orthopnea, PND, pedal edema, palpitations, pre-  syncope, syncope or exertional chest pain.   CURRENT MEDICATIONS:  1. Synthroid 0.175 mg p.o. daily.  2. Vitamin C.  3. Multivitamins.  4. Fosamax.  5. Flomax.  6. Lisinopril 10 mg p.o. daily.  7. Celexa.  8. Zocor 20 mg p.o. daily.  9. Iron.  10.Levothyroxine 125 mcg p.o. daily.  11.Protonix.  12.A baby aspirin.   PHYSICAL EXAMINATION:  VITAL SIGNS:  Pulse 48, blood pressure 117/69,  weight 249 pounds  HEENT:  Normal.  NECK:  Supple.  CHEST:  Clear.  CARDIOVASCULAR:  A bradycardic rate but a regular rhythm.  ABDOMEN:  Benign.  EXTREMITIES:  No edema.   Electrocardiogram shows a sinus bradycardia at the rate of 48 with a  right bundle branch block.   DIAGNOSES:  1.  Bradycardia - I again had a long discussion with [name] [name] today      concerning his bradycardia.  He apparently has a twin brother who      ha now been diagnosed with bradycardia and a pacemaker is      scheduled.  He wonders whether this may be beneficial to him.  His      heart rate did increased previously with exercise to 114, and I am      not convinced that his dyspnea is related to chronotropic      incompetence; however, Dr. Doylene Canning. Ladona Ridgel has seen him in the      past, and we will have him re-evaluated to see whether he feels a      pacemaker is indicated.  I have instructed him that if indeed his      dyspnea is not related to a slow heart rate, then his symptoms      would not improve with a pacemaker placement.  He understands this.  2. Question linear mass in the ventricle on echocardiogram - A follow-      up echocardiogram revealed an LV band, but was otherwise      unremarkable.  3. Hypertension - His blood pressure is well-controlled on his      medications.  4. Hyperlipidemia - Per Dr. Corwin Levins.  5. History of prostate cancer.  6. History of iron deficiency anemia.   PLAN:  I will see him back in six months and we will schedule him to see  Dr. Ladona Ridgel with his next available.     Madolyn Frieze Jens Som, MD, [facility]  Electronically Signed    BSC/MedQ  DD: 06/06/2007  DT: 06/06/2007  Job #: [id number]

## 2011-03-16 NOTE — Assessment & Plan Note (Signed)
[facility]                            CARDIOLOGY OFFICE NOTE   NAME:Bushey, MICHOLAS DRUMWRIGHT                        MRN:          [id number]  DATE:12/21/2007                            DOB:          1932-05-05    Mr. Iturralde is a very pleasant gentleman who has a history of bradycardia  that has been relatively asymptomatic.  We did perform a monitor  previously that shows heart rate occasion with junctional rhythm and  rates as low as the 30s.  However, this was predominantly when he was  sleeping and a previous treadmill did show chronotropic competence.  We  did ask Dr. Ladona Ridgel to again reviewed the patient and he saw the patient  on June 30, 2007.  He felt that he may require pacemaker in the  future, but at present it was not indicated.  Note he did have Myoview  in January 1998 that showed no ischemia or infarction.  A previous  echocardiogram questioned an LV mass but a follow up showed a prominent  left ventricular band, but there was no mass noted.  Since I last saw  him, he had some dizziness with standing suddenly or changing positions,  but otherwise has not had problems with this.  There is no syncope.  He  has mild dyspnea on exertion.  There is no orthopnea, PND or pedal  edema.  He has not had exertional chest pain.   MEDICATIONS:  Include terazosin 5 mg daily, citalopram 10 mg daily,  alendronate, vitamin C, multivitamin, lisinopril 10 mg daily, Zocor 20  mg daily, iron, levothyroxine 125 mcg daily, aspirin for 325 mg daily,  omeprazole and Colace.   PHYSICAL EXAM:  Today shows a blood pressure of 115/57.  His pulse is  46.  Weight is 252 pounds.  HEENT:  Normal.  NECK:  Supple.  CHEST:  Clear.  CARDIOVASCULAR:  Reveals bradycardic rate but a regular rhythm.  ABDOMEN:  Exam shows no tenderness.  EXTREMITIES:  Show no edema.   His electrocardiogram shows sinus bradycardia rate of 46.  There is a  right bundle branch block but there are no  ST changes noted.   DIAGNOSES:  1. History of bradycardia - Mr. Brumbach again is having no significant      symptoms of syncope, exertional chest pain, or increasing shortness      of breath.  We will therefore follow him expectantly.  We may need      repeat a monitor in the future but at present pacemaker is not      indicated.  2. Question mass in the left ventricle on echocardiogram - a follow-up      echocardiogram revealed an LV band but no mass.  3. Hypertension - his blood pressure is adequately controlled.  His he      does have some dizziness with standing at times but this does not      appear to be a significant problem.  If it does become a problem,      then we can discontinue his lisinopril in the future.  4. Hyperlipidemia - this is being managed by Dr. Jonny Ruiz.  5. History of prostate cancer.  6. History of iron deficiency anemia.   We will see back in 12 months.     Madolyn Frieze Jens Som, MD, [facility]  Electronically Signed    BSC/MedQ  DD: 12/21/2007  DT: 12/21/2007  Job #: [id number]

## 2011-03-16 NOTE — Assessment & Plan Note (Signed)
[facility]                            CARDIOLOGY OFFICE NOTE   NAME:Hausen, BRAILON DON                        MRN:          [id number]  DATE:12/04/2008                            DOB:          18-Aug-1932    Mr. Papadopoulos is a very pleasant 77 year old gentleman who I have seen in the  past for bradycardia.  A previous monitor has shown that his heart rate  occasionally dips into the low 30s with a junctional rhythm, but he did  demonstrate chronotropic competence on a previous treadmill.  I have  reviewed this with Dr. Ladona Ridgel previously and he felt that a pacemaker  may be necessary in the future, but not at present.  Since I last saw  him, he is doing well from symptomatic standpoint.  He has dyspnea with  more extreme activities, but now with routine activities.  This is  unchanged.  There is no orthopnea, PND, or pedal edema.  He has not had  chest pain.  He occasionally feels mildly lightheaded when he stands  suddenly, but otherwise he has had no presyncope or syncope.   MEDICATIONS:  1. Terazosin 5 mg p.o. nightly.  2. Citalopram 20 mg tablets one-half p.o. daily.  3. Alendronate.  4. Sodium.  5. Cetirizine 10 mg p.o. daily.  6. Vitamin C.  7. Multivitamin.  8. Lisinopril 20 mg p.o. daily.  9. Zocor 20 mg p.o. daily.  10.Iron.  11.Levothyroxine 125 mcg p.o. daily.  12.Aspirin 325 mg p.o. daily.  13.Omeprazole 20 mg p.o. daily.  14.Colace.   PHYSICAL EXAMINATION:  VITAL SIGNS:  Blood pressure of 151/66 and his  pulse is 45.  He weighs 254 pounds.  HEENT:  Normal.  NECK:  Supple.  CHEST:  Clear.  CARDIOVASCULAR:  Bradycardic rate.  Regular rhythm.  ABDOMEN:  No tenderness.  EXTREMITIES:  No edema.   Electrocardiogram shows a marked sinus bradycardia at a rate of 46.  There is right bundle-branch block.  The right bundle is old.   DIAGNOSES:  1. Bradycardia - the patient continues to have bradycardia, but he is      not having symptoms of  presyncope or syncope.  There has also been      no increasing shortness of breath.  We will therefore make no      changes at this point.  I will see him back in 1 year.  We most      likely repeat his Holter monitor at that time.  2. Hypertension - his blood pressure is minimally elevated today.      However, he does have some lightheadedness with standing.  I will      continue on his present medications.  Dr. Jonny Ruiz is following his      renal function.  3. Hyperlipidemia - he will continue on his statin.  This is being      managed by Dr. Jonny Ruiz.  4. History of prostate cancer.  5. History of iron-deficiency anemia.     Madolyn Frieze Jens Som, MD, [facility]  Electronically Signed    BSC/MedQ  DD: 12/04/2008  DT: 12/04/2008  Job #: [id number]

## 2011-03-19 NOTE — Op Note (Signed)
NAME:  [name] [name], [name] [name]                 ACCOUNT NO.:  [id number]   MEDICAL RECORD NO.:  [id number]          PATIENT TYPE:  AMB   LOCATION:  DSC                          FACILITY:  [facility]   PHYSICIAN:  Christopher E. Ezzard Standing, M.D.DATE OF BIRTH:  1932-03-10   DATE OF PROCEDURE:  01/31/2007  DATE OF DISCHARGE:                               OPERATIVE REPORT   PREOPERATIVE DIAGNOSIS:  Left submandibular mass, questionable  adenopathy versus enlarged submandibular gland.   POSTOPERATIVE DIAGNOSIS:  Left submandibular mass, questionable  adenopathy versus enlarged submandibular gland.   OPERATIONS:  Excision of left submandibular gland.   SURGEON:  Narda Bonds, M.D.   ANESTHESIA:  General endotracheal.   COMPLICATIONS:  None.   CLINICAL NOTE:  [name] is a 77 year old gentleman who has a  palpable mass or adenopathy in the left submandibular area.  He has a  firm nodule measuring approximately 2 to 2.5 cm in size adjacent or  involving the submandibular gland.  He is taken to operating room this  time for excisional left submandibular mass.   DESCRIPTION OF PROCEDURE:  After adequate endotracheal anesthesia, Mr.  Garfinkle received a gram Ancef IV preoperatively and the neck was prepped  with Betadine solution and draped out with sterile towels.  An incision  was made at the inferior aspect of the mass.  Dissection was carried  down through the platysma muscle and the subcutaneous tissue.  The  larger mass which measured approximately 2 to 2.5 cm was a portion of  the submandibular gland.  There were some smaller 1 cm lymph nodes in  the submandibular region, but these were much smaller than the palpable  mass that measured approximately 2 cm in size.  The gland was then  dissected out.  The branches from the facial vein and artery were  ligated with 2-0 and 3-0 silk sutures.  The gland was dissected off of  the deep musculature.  The submandibular duct was identified and  clamped,  ligated with a 2-0 silk suture as was the branch from the  lingual nerve was identified and ligated with 2-0 silk suture.  The  gland was sent in specimen and saline as a fresh specimen as it was  enlarged with a large lump posteriorly on the gland.  Hemostasis was  obtained with cautery.  Following completion removal of the gland,  submandibular was palpated again and again identified were some small 1  cm lymph nodes but these were not removed.  Defect was then closed with  3-0 chromic sutures subcutaneously to reapproximate the platysma muscle  and 5-0 nylon reapproximate skin edges.  Dressing was applied.  The  patient was awoke from anesthesia and transferred to the postop doing  well.   DISPOSITION:  [name] is discharged home later this morning on Keflex 500  mg b.i.d. for 4 days, Tylenol and Vicodin one to two q.4 h p.r.n. pain.  Will have him follow up in my office in 1 week for recheck.  Review  pathology and have sutures removed.           ______________________________  Kristine Garbe. Ezzard Standing, M.D.     CEN/MEDQ  D:  01/31/2007  T:  01/31/2007  Job:  [id number]   cc:   Corwin Levins, MD

## 2011-03-19 NOTE — Op Note (Signed)
[place]  Patient:    [name] [name], [name] [name]                        MRN: [id number] Proc. Date: 04/04/00 Adm. Date:  [id number] Attending:  Ollen Gross V                           Operative Report  PREOPERATIVE DIAGNOSIS:  Aseptic loosening of right total hip.  POSTOPERATIVE DIAGNOSIS:  Aseptic loosening of right total hip.  PROCEDURE:  Right femoral revision with strut allografting.  SURGEON:  Trudee Grip, M.D.  ASSISTANT:  Dr. Montez Morita.  ANESTHESIA:  General.  ESTIMATED BLOOD LOSS:  800 cc.  REPLACEMENT:  Two units autologous and three units of Cell Saver.  DRAINS:  Hemovac x 1.  COMPLICATIONS:  None.  CONDITION:  Stable to recovery.  INDICATIONS:  [name] [name] is a 77 year old male who has had three previous hip surgeries performed in [place], [place].  He removed recently to [place], [place].  He has had severe thigh pain for over a year to two years now.  His x-rays revealed gross aseptic loosening of the femoral component with subsidance and varus shift.  He does not have fracture through the lateral cortex of his femur.  In addition, he has 8 cm of cement distal to the tip of the stem.  He presents now for femoral revision.  The acetabular component looks to be in good position with good ingrowth and no abnormal polyethylene wear per radiographs.  DESCRIPTION OF PROCEDURE:  After successful initiation of general anesthetic, the patient was placed in the left lateral decubitus position with the right side up and held with the hip positioner.  The right lower extremity was isolated for perineal plastic drapes and prepped and draped in the usual sterile fashion.  A long posterolateral position was utilized by utilizing the distal half of an old incision of his and then proximally a new incision was made for the posterior approach.  The skin was cut with a 10 blade to the subcutaneous tissues and level of the fascia  lata, which was incised in line with the skin incision.  The sciatic nerve was palpated and protected.  Then the pseudocapsule was removed off of the femur to reveal the hip joint.  Once all of the scars were removed, the hip was easily dislocated and then the femoral component removed with ease.  Proximal cement was also removed easily. The C-arm was brought in to define the extent of the distal cement column and was marked on the skin.  The fascia of the vastus lateralis was then split and the muscle elevated off of the posterior intermuscular septum.  Perforators were identified and coagulated.  Drill holes were then used to outline an approximately 8 cm cortical window.  The window was then removed with a saw. This revealed the distal extent of the cement plug and the cement was removed from the bone, which was well adhered to the bone.  At this point, all of the cement had been removed from the canal.  He was very thin at the superior aspect of the window from the ballooning from the varus shift of the stem and I decided to put strut allograft around the femur and the area of the window. Two struts were formulated and then four Dall-Miles cables were passed to incorporate the struts.  The cable was then tightened  in tension and then cut. This had excellent stability at the window and in the thinned area of the ______.  At this point, reaming was then done proximally using the flexible reamers up to a size 19.  This ______ and straight reamers had to be performed up to a size 22.5.  The trial 22.5 solution stem was placed and then reduction performed.  He needed a +13 head to get excellent stability as he had a tremendous amount of bone loss medially at the femur.  The trial was then removed and the permanent 10-inch Solution stem 22.5 mm was impacted into the femur and set to the level such that with the 13.5 head he had excellent tissue tension.  Fluorospots were taken and the tip of  the stem was beyond the cortical window.  At this point, the permanent 32 mm 13.5 head was placed and the hip reduced.  Of note, the acetabular component was inspected, well fixed, and no evidence of eccentric poly wear.  At this point, the wound was copiously irrigated with antibiotic solution and the vastus lateralis fascia was closed with running 1-0 Vicryl.  The soft tissues were attached through the femur with drill holes at the trochanteric region.  The fascia lata was closed over one limb of the Hemovac drain with interrupted 1-0 Vicryl.  The subcu was closed in two layers with interrupted 2-0 Vicryl.  The skin was closed with staples.  Drains hooked to suction.  Incision clean and dry.  A bulky sterile dressing was applied.  He was placed into a knee immobilizer, awaken, and transported to recovery in stable condition.  Of note, he had excellent stability once the hip was reduced and had full extension and full external rotation to 70 degrees of flexion, 40 degrees of abduction, 80 degrees internal rotation, and then 90 degrees flexion at 40 degrees internal rotation.  As stated, the patient was awaken and transported to recovery in stable condition. DD:  04/04/00 TD:  04/06/00 Job: [id number]

## 2011-03-19 NOTE — Op Note (Signed)
NAME:  [name] [name], [name] [name]                 ACCOUNT NO.:  [id number]   MEDICAL RECORD NO.:  [id number]          PATIENT TYPE:  AMB   LOCATION:  DAY                          FACILITY:  [facility]   PHYSICIAN:  Anselm Pancoast. Weatherly, M.D.DATE OF BIRTH:  Sep 17, 1932   DATE OF PROCEDURE:  11/29/2006  DATE OF DISCHARGE:                               OPERATIVE REPORT   PREOPERATIVE DIAGNOSIS:  Chronic cholecystitis with stones.   POSTOPERATIVE DIAGNOSIS:  Chronic cholecystitis with stones.   OPERATIONS:  Laparoscopic cholecystectomy with cholangiogram.   SURGEON:  Anselm Pancoast. Zachery Dakins, M.D.   HISTORY:  The patient is a 77 year old male referred to me by Dr. Oliver Barre.  He had been seen in the emergency room by Dr. Denton Lank where he  presented with pain in the chest and upper abdomen.  His liver function  studies were normal.  He had a mildly elevated glucose and on one  occasion his amylase was mildly elevated.  He saw Dr. Oliver Barre after  Helicobacter was positive and he treated him with amoxicillin and the  other and then they had ultrasound that definitely showed stones in his  gallbladder.  He has had a couple of episodes of pain that occur in the  early a.m. consistent with gallbladder attack.  He was referred to me in  mid January.  The patient has had cancer of the prostate.  He has also  had hip surgery, right shoulder surgery and is on blood pressure  medication.  Preoperatively his liver function studies continued to be  normal and he has had a cardiology evaluation with no changes from his  evaluation approximately 2002 and was cleared for surgery.  The patient  preoperatively was given 3 grams of Unasyn __________ positioned on the  OR table.  After induction of general anesthesia endotracheal tube and  oral tube into the stomach, the abdomen was prepped and draped with  Betadine solution and draped in a sterile manner.  I made a small  incision below the umbilicus.  The fascia was  identified kind of deep  and picked up between two Kocher's.  A small opening was made through  the fascia and then opening to the peritoneal cavity.  Pursestring  suture of 0 Vicryl was placed and Hasson cannula introduced.  The  gallbladder was distended.  It was not acutely inflamed.  We noted there  was a little cyst on the undersurface of the right lobe of the liver  lateral to the gallbladder.  The gallbladder was grasped and retracted  upward and outward and then the proximal portion of the gallbladder was  dissected.  The peritoneum opened carefully.  The cystic duct was easily  visualized.  I separated the duct and the artery and doubly clipped the  artery just distal to the lymph node, placed a clip very distally and  then divided this.  This exposed the cystic duct and then we could  encompass that.  Placed a clip across it and then made a little opening  more proximal.  Then the [place] catheter introduced and held in  place with  a clip.  Cholangiogram was obtained which showed good fill of the  extrahepatic biliary system, good __________ of the duodenum.  The  intrahepatic radicals did not feel that well since the dye was going  distally so quickly.  We had about probably a 2 to 3 cm cystic duct.  With the catheter triply clipped the cystic duct and divided it.  Next  the gallbladder was freed from its bed.  The low areolar tissue  inferiorly was separated with a right angle.  At the end of the case,  there was a posterior branch of the cystic artery and this was doubly  clipped and proximally divided.  There did not appear to be a blood  vessel in it.  Then the gallbladder freed from its bed.  Good hemostasis  was obtained.  The little cyst that was located probably an inch or two  more lateral was not ruptured.  Then we placed the gallbladder in an  EndoCatch bag.  A little irrigated fluid was used.  There was no  evidence of bile leaking or bleeding.  Then the camera was  switched to  the upper 10 mm port.  The bag containing the gallbladder was grasped  and brought out at the umbilicus.  There were numerous stones in the  gallbladder.  I put a figure-of-eight of 0 Vicryl on a GU5 needle in  addition to the pursestring tied on both and anesthetized the fascia  with Marcaine.  The upper 10 mm trocar after all the port sites had been  removed under direct vision was also closed with a figure-of-eight of 0  Vicryl and the  subcuticular layers closed with 0 Vicryl, Benzoin and Steri-Strips on  the skin.  The patient tolerated the procedure nicely and was extubated  without difficulty.  Because of his cardiac disease, we will keep him  tonight, but he should be able to go home in the morning.  Hopefully he  will have no further episodes of epigastric pain.           ______________________________  Anselm Pancoast. Zachery Dakins, M.D.     WJW/MEDQ  D:  11/29/2006  T:  11/29/2006  Job:  [id number]   cc:   Corwin Levins, MD  [address]  [place]  [place] [address]

## 2011-03-19 NOTE — Assessment & Plan Note (Signed)
[facility]                            CARDIOLOGY OFFICE NOTE   NAME:[name] [name], [name] [name]                        MRN:          [id number]  DATE:11/16/2006                            DOB:          1931/11/11    [name] [name] is a 77 year old male who I am asked to evaluate for question  cardiac mass.  The patient apparently has been seen by Dr. Tenny Craw in the  past secondary to bradycardia.  Note he had a nuclear study last in June  2003.  At that time he was found to have an ejection fraction of 67%.  Note that he increased his heart rate to 126.  There was a question of  small prior inferior apical infarct but there was no ischemia.  Also  note that he had an echocardiogram in February 2003 that showed normal  left ventricular function and mild mitral valve prolapse.  The patient  was seen by Dr. Ladona Ridgel on May 29, 2002 with bradycardia.  He had a  Holter monitor that showed heart rates as low as the 30's and there were  occasional junctional beats.  Dr. Ladona Ridgel considered a pacemaker at that  time but it was not clear his symptoms of dizziness or shortness of  breath were related to bradycardia and he was scheduled to see the  patient back in one year.  However, the patient has not been seen since  that time.  He recently underwent an echocardiogram secondary to  dyspnea.  This was interpreted by Dr. Diona Browner.  The patient was found  to have normal left ventricular function.  There was a linear mobile  density near the apex of uncertain significance.  It was felt this may  be a portion of the mobile chordal apparatus.  No papillary muscle but  suggested TEE if clinically indicated.  Because of that abnormality, we  will further evaluate.  Note the patient also complains of dyspnea on  exertion that is worsening over the past six months.  There is no  orthopnea, PND, pedal edema, or palpitations. There was no exertional  chest pain.  I also note he complains of  continued dizzy spells that are  also worsening.  This typically occurs when he stands suddenly.  Note  there has been no syncope.   MEDICATIONS:  1. Synthroid 175 mcg p.o. daily.  2. Aspirin 325 mg  daily.  3. Vitamin C.  4. Multivitamins.  5. Peritonei ointment.  6. Fosamax.  7. Flomax.  8. Lisinopril 10 mg daily.  9. Prevpac.  10.Celexa.  11.Protonix.  12.Zocor 20 mg p.o. at night.  13.Iron.   ALLERGIES:  He has no known drug allergies.   SOCIAL HISTORY:  He has history of tobacco but has not smoked in  possibly 20 to 30 years.  He did not consume alcohol.   FAMILY HISTORY:  Negative for coronary artery disease.   PAST MEDICAL HISTORY:  1. Hypertension.  2. Hyperlipidemia.  3. No diabetes mellitus.  4. Hypothyroidism.  5. History of bradycardia as outlined in the HPI.  6. History of  anxiety and depression.  7. Previous right shoulder rotator cuff repair.  8. Multiple surgeries on the right hip which has resulted in      shortening oft right lower extremity.  9. Remote history of mitral valve prolapse.  10.Prostate cancer.   REVIEW OF SYSTEMS:  He denies any headaches or fevers or chills.  There  is no productive cough or hemoptysis. There is no dysphagia,  odynophagia, melena, or hematochezia.  There is no dysuria, hematuria,  There is no __________ or seizure activity.  There is no orthopnea, PND  or pedal edema.  He does have some pain in his right hip and legs but  there is no claudication.  He is being evaluated at present for an iron-  deficiency anemia and is scheduled to undergo endoscopy this evening.  The remaining systems are negative.   PHYSICAL EXAMINATION:  VITAL SIGNS:  Blood pressure 122/69, pulse 53,  weight 145 pounds.  GENERAL:  He is well-developed, well-nourished, in no acute distress.  His skin is warm and dry.  He does not appear to be depressed.  There is  no  peripheral clubbing.  SKIN:  Warm and dry.  BACK:  Normal.  HEENT:  Unremarkable with normal eyelids.  NECK:  Supple with normal upstroke bilaterally.  Cannot appreciate  bruits.  There is no jugular venous distension and no thyromegaly is  noted.  CHEST:  Clear to auscultation.  __________.  CARDIOVASCULAR:  Bradycardic rate buy a regular.  I cannot appreciate  murmurs, rubs or gallops.  His PMI is nondisplaced.  ABDOMEN:  No tenderness.  Positive bowel sounds.  No hepatosplenomegaly.  No masses appreciated.  There is no abdominal bruit.  He has 2+ femoral  pulses bilaterally.  No bruits.  EXTREMITIES:  No edema and I could palpate no cords.  He has 2+  posterior tibial pulses bilaterally.  Examination of his extremities  showed his right lower extremity is approximately 5 to 7 mm shorter than  his left.  NEUROLOGIC: Grossly intact.   His electrocardiogram today shows a sinus rhythm at a rate of 51.  The  axis is normal.  There is a right bundle branch block.   DIAGNOSES:  1. Question linear mass in the ventricle on echocardiogram.  2. Dyspnea on exertion.  3. Bradycardia.  4. Presyncope.  5. Hypertension.  6. Hyperlipidemia.  7. History of prostate cancer.  8. Iron-deficiency anemia.   PLAN:  Mr. Revolorio presents for evaluation of a question mass on  transthoracic echocardiogram.  I did review that study in the office  today and it appears that it is most likely a cord from papillary muscle  although it is difficult to see.  He has not had any neurological issues  including no history of TIAs.  I, therefore, do not think we need to  pursue this with a transesophageal echocardiogram at this point.  I have  recommended that we merely repeat a followup echocardiogram in four  months.  He also is having dyspnea on exertion and presyncopal episodes  and does have a history of bradycardia.  I wonder if this may be  contributing to his symptoms.  We will schedule him to have a stress Myoview to exclude ischemia and also to evaluate chronotropic   competence.  I will also schedule him to have a cardio event monitor to  see his heart rate trend and also to correlate his presyncopal episode  with his heart rate.  He may benefit  from a pacemaker in the future but  we will await results of his monitor.  He will, otherwise continue on  his present medication and I will see him back in four to six weeks.     Madolyn Frieze Jens Som, MD, [facility]  Electronically Signed    BSC/MedQ  DD: 11/16/2006  DT: 11/16/2006  Job #: [id number]   cc:   Corwin Levins, MD

## 2011-03-19 NOTE — Assessment & Plan Note (Signed)
[facility]                            CARDIOLOGY OFFICE NOTE   NAME:Gemme, GINO GARRABRANT                        MRN:          [id number]  DATE:01/25/2007                            DOB:          11/03/1931    EXERCISE TREADMILL:  Mr. Mckamie is a 77 year old male, who has a history  of bradycardia, predominantly with sleeping.  He also has a low resting  heart rate.  This study was performed to evaluate chronotropic  competence.   Patient exercised for a duration of 4 minutes and 41 seconds on the  modified Bruce protocol.  His heart rate at rest was 49 and increased to  a maximum of 114.  His blood pressure resting was 124/76 and increased  to 158/74.  There was no chest pain during this study.  There was mild  dyspnea, which was the reason for termination.  Note:  The work load was  4.8 mets.  Note:  There were no ST changes.  There was a question of  transient junctional rhythm.   FINAL INTERPRETATION:  Exercise treadmill with no chest pain and no  electrocardiographic changes.  This treadmill also demonstrates  chronotropic competence.     Madolyn Frieze Jens Som, MD, [facility]  Electronically Signed    BSC/MedQ  DD: 01/25/2007  DT: 01/25/2007  Job #: [id number]

## 2011-03-19 NOTE — H&P (Signed)
[facility]  Patient:    [name] [name], [name] [name].                     MRN: [id number] Adm. Date:  04/04/00 Attending:  Ollen Gross, M.D. Dictator:   Grayland Jack, P.A.                         History and Physical  DATE OF BIRTH:  [birth year]  CHIEF COMPLAINT:  Right thigh pain.  HISTORY OF PRESENT ILLNESS:  This 77 year old male has a right total hip arthroplasty that was originally placed in 1970.  Since that time he has had three revisions to the hip.  Over the past few months he has had increased complaints of significant right thigh pain, which increases with weightbearing.  He also complains of a feeling of instability.  [name] has been evaluated and assessed at the [facility] and Dr. Priscille Kluver felt like he needed a revision.  [name] decided to have his revision done here in [place], which is where he and his family reside.  X-rays show a grossly loose cemented long-stem prosthesis which has gone into significant varus.  Due to this patients pain and discomfort, as well as x-ray documentation, it is felt that the patient would best benefit from surgical intervention.  The risks, benefits, and complications of this surgery were discussed with the patient in detail, and he agrees to proceed.  ALLERGIES:  No known drug allergies.  CURRENT MEDICATIONS: 1. Piroxicam 20 mg p.o. q.d. 2. Levothyroxine 0.2 mg p.o. q.a.m. 3. Hydrochlorothiazide 25 mg one tablet p.o. q.d. 4. Aspirin 325 mg one tablet p.o. q.d. 5. Simvastin 20 mg one tablet p.o. q.h.s. 6. Lisinopril 10 mg p.o. b.i.d. 7. Terazosin 2 mg p.o. b.i.d.  PAST MEDICAL HISTORY: 1. Significant for hypertension. 2. Hyperlipidemia. 3. Hypothyroidism. 4. Mitral valve prolapse. 5. History of deep vein thrombosis.  The patients primary care physician is Dr. Bosie Clos at the [facility]  PAST SURGICAL HISTORY: 1. Significant for an inguinal hernia repair. 2. Right hip surgery x  4. 3. Right rotator cuff repair.  SOCIAL HISTORY:  The patient lives in [place].  He is married and has three children.  He is currently retired.  They live in a two-story home, with the bedroom being on the second floor with 14 steps.  The patient denies tobacco and alcohol use.  FAMILY HISTORY:  Significant for cancer and diabetes mellitus.  REVIEW OF SYSTEMS:  GENERAL:  The patient denies fevers, chills, weight changes, or malaise.  HEENT:  The patient denies headaches, visual changes, ear pain or pressure, nasal congestion or discharge, sore throats, or dysphagia.  CARDIAC: The patient denies chest pain, palpitations, angina, or dyspnea on exertion. RESPIRATORY:  The patient denies a productive cough, hemoptysis, or shortness of breath.  GI:  The patient denies abdominal pain, nausea, vomiting, constipation, diarrhea, or bloody stools.  GU:  The patient denies dysuria, hematuria, increasing frequency, or hesitancy.  MUSCULOSKELETAL:  See the HPI.  HEMATOLOGICAL:  The patient denies anemia or bleeding tendencies.  NEURO:  The patient denies a history of TIAs or CVAs.  PHYSICAL EXAMINATION:  VITAL SIGNS:  Temperature afebrile, pulse 60, respirations 16, blood pressure 130/70.  GENERAL:  He is a well-developed, well-nourished 77 year old black male, in no acute distress.  HEENT:  Normocephalic, atraumatic.  Pupils equal, round, reactive to light. Extraocular muscles intact.  NECK:  Supple, no jugular venous distention,  no lymphadenopathy.  No carotid bruits appreciated.  CHEST:  Clear to auscultation.  No rales or rhonchi, no wheezing appreciated.  HEART:  A regular rate and rhythm, no rubs, gallops, or murmurs.  ABDOMEN:  Positive bowel sounds, soft, nontender.  No organomegaly appreciated.  BREASTS/GU/RECTAL:  Not done, not pertinent to the present illness.  EXTREMITIES:  Sensation intact, equal and adequate to bilateral lower extremities. Distal pulses are  2+ bilaterally.  Motor is intact.  The patient does have a decreased active range of motion in all planes of the right hip.  IMPRESSION: 1. Failed right total hip arthroplasty. 2. Hypertension. 3. Hyperlipidemia. 4. Hypothyroidism. 5. Mitral valve prolapse. 6. History of deep vein thrombosis.  PLAN:  This patient will be admitted to [facility] on June , 2001, to undergo a revision of the right total hip arthroplasty by Dr. Ollen Gross.  The patient will be placed on Coumadin postoperatively for deep vein thrombosis prophylaxis.  The postoperative course, including physical therapy, ave been discussed with the patient in detail.  All questions have been encouraged nd answered. DD:  03/31/00 TD:  03/31/00 Job: [id number]

## 2011-03-19 NOTE — Discharge Summary (Signed)
[facility]  Patient:    [name] [name], [name] [name]                        MRN: [id number] Adm. Date:  [id number] Disc. Date: [id number] Attending:  Loanne Drilling Dictator:   Grayland Jack, P.A.                           Discharge Summary  ADMITTING DIAGNOSES: 1. Failed right total hip arthroplasty. 2. Hypertension. 3. Hyperlipidemia. 4. Hypothyroidism. 5. Mitral valve prolapse. 6. History of deep venous thrombosis.  DISCHARGE DIAGNOSES: 1. Status post right total hip arthroplasty revision. 2. Hypertension. 3. Hyperlipidemia. 4. Hypothyroidism. 5. Mitral valve prolapse. 6. History of deep venous thrombosis.  PROCEDURE:  Revision of right total hip arthroplasty.  SURGEON:  Ollen Gross, M.D.  ASSISTANT:  Philips J. Montez Morita, M.D.  CONSULTATIONS:  None.  BRIEF HISTORY:  The patient is a 77 year old male who originally had a right total hip replacement in 1970.  Since that time, he has had a total of three revisions to the hip.  Over the past two months, he has had increased complaints of severe right side pain.  This increases with weight bearing.  He also complains of a feeling of instability.  The patient had initially been evaluated and assessed at the [facility] by Dr. Karl Ito and felt like he needed revision of his right total hip. The patient currently resides in [place] and wished to have the procedure done here.  He was seen and evaluated by Dr. Ollen Gross, who agreed with his need for revision of his total hip replacement.  The risks, benefits and complications of this surgery were discussed with the patient in detail and he agreed to proceed.  HOSPITAL COURSE:  On April 04, 2000, the patient was admitted to [facility] and underwent the above stated procedure, which he tolerated well.  Physical therapy was initiated postoperatively for transfers, ambulation and total hip precautions.  Coumadin therapy was initiated  postoperatively for DVT prophylaxis per pharmacy protocol.  Occupational therapy was initiated postoperatively to assist the patient with ADLs.  The patient did have some postoperative hypotension, and his blood pressure medications were put on hold.  The patient had some abdominal distention which was treated with Pepcid and responded nicely.  On postoperative day #5 (April 09, 2000), the patient was up without complaints of pain. He stated "I am ready to go home."  On exam, he was afebrile and his vital signs were stable.  His dressing was dry.  The incision site was clean and dry with no erythema or discharge.  The calf was soft and nontender.  He was neurovascularly intact to the right lower extremity.  It was felt that the patient was stable for discharge to home at this time.  LABORATORY DATA:  Preoperative CBC showed a slightly decreased hemoglobin at 12.1 and hematocrit 35.3.  The patient did receive two units of autologous blood and  three units of Cell Saver blood during the procedure.  Postoperatively, the patient remained relatively stable with hemoglobin 8.9 and hematocrit 26.1 prior to discharge.  PT prior to admission was 13.9.  INR was 1.2 and PTT 26.  The patient was placed on Coumadin therapy per pharmacy protocol and, prior to discharge, the patients PT was 21.8 and INR was 2.7.  Routine chemistries prior to admission were within normal limits.  The patients glucose increased slightly  after the surgery to 127.  Preoperative urinalysis was within normal limits.  CARDIOLOGY:  Preoperative EKG showed marked sinus bradycardia with a right bundle branch block.  RADIOLOGY:  Preoperative chest x-ray was negative for active disease. Preoperative hip x-ray showed loosening of the femoral component of the right total hip.  CONDITION ON DISCHARGE:  Improved and stable.  DISCHARGE PLAN:  The patient is being discharged to home.  He will require home  health  physical therapy and will maintain touch-dwon weight bearing status as well as total hip precautions.  He will also require home health nursing for protimes while on Coumadin therapy.  He may showed ad lib.  He is to keep the incision site clean and dry and will need daily dry dressing changes.  He is to resume his home diet and his home medications.  DISCHARGE MEDICATIONS: 1. Percocet 5 mg, #40. 2. Robaxin 500 mg, #20. 3. Coumadin per pharmacy protocol. 4. Colace or other over-the-counter stool softener.  DISCHARGE INSTRUCTIONS:  He is to return to the clinic on Friday, April 15, 2000, to follow up with Dr. Ollen Gross.  Please call [phone] to make an appointment r if there are any questions or concerns. DD:  05/10/00 TD:  05/11/00 Job: [id number]

## 2011-03-19 NOTE — Assessment & Plan Note (Signed)
[facility]ALTH[facility]                            CARDIOLOGY OFFICE NOTE   NAME:[name], [name] BOGACZ                        MRN:          [id number]  DATE:12/29/2006                            DOB:          10-13-1932    SUBJECTIVE:  [name] [name] returns for followup today.  Please refer to my  note of November 16, 2006, for details.  At that time he was referred for  evaluation of a questionable mass in his LV.  We did review this  echocardiogram and felt that it was most likely a cord from the  papillary muscle, although difficult to see.  We plan to repeat his  echocardiogram in four months.   He also is having some dyspnea on exertion and did have a history of  bradycardia.  We scheduled a Myoview on November 25, 2006, but it was  performed with adenosine, as the patient was complaining of dizziness.  His ejection fraction was normal.  There was no scar or ischemia.  Finally he had a CardioNet monitor performed.  This showed sinus  bradycardia and occasional junctional rhythm.  His heart rate was as low  as the 30's, but this was predominantly when he was sleeping.  Note that  there were no symptoms reported.  Of note, the patient has not had any  syncope.  He does have some dizziness with standing at times.  He has  not had chest pain or shortness of breath since I saw him.   MEDICATIONS:  1. Synthroid 0.175 mg p.o. daily.  2. Aspirin 325 mg p.o. daily.  3. Vitamin C.  4. Multivitamin.  5. Fosamax.  6. Flomax.  7. Lisinopril 10 mg p.o. daily.  8. Celexa.  9. Protonix.  10.Zocor.  11.Iron.   PHYSICAL EXAMINATION:  VITAL SIGNS:  Blood pressure 127/66, pulse 49,  weight 241 pounds.  CHEST:  Clear.  CARDIOVASCULAR:  Reveals bradycardic rate but a regular rhythm.  EXTREMITIES:  No edema.   Electrocardiogram shows a sinus bradycardia, rate of 49.  There is a  right bundle branch block.   DIAGNOSES:  1. Question linear mass in the ventricle on  echocardiogram.  2. Bradycardia.  3. Hypertension.  4. Hyperlipidemia.  5. History of prostate cancer.  6. History of iron deficiency anemia.   PLAN:  [name] [name] appears to be doing reasonably well from a symptomatic  standpoint.  He does have some dizziness with standing, which resolves  quickly.  This appears to be improving.  It sounds to be orthostatic.  If these worsen, then we can discontinue his ACE inhibitor in the  future.  Note:  We scheduled him to have a stress Myoview, but this was  performed with adenosine due to dizziness.  I would like to schedule him  to have an exercise treadmill to make sure that he demonstrates  chronotropic competence.  If not, then he may benefit from a pacemaker.  Otherwise there are no symptoms noted at present.  There is no clear  indication.  We will plan to repeat his echocardiogram for the question  of mass in May.  I will see him back in approximately six months.     Madolyn Frieze Jens Som, MD, [facility]  Electronically Signed    BSC/MedQ  DD: 12/29/2006  DT: 12/29/2006  Job #: [id number]   cc:   Corwin Levins, MD

## 2011-03-19 NOTE — Discharge Summary (Signed)
[facility] [address]  Patient:    [name] [name], [name] [name]                        MRN: [id number] Adm. Date:  [id number] Disc. Date: [id number] Attending:  Loanne Drilling Dictator:   Grayland Jack, P.A.                           Discharge Summary  ADMITTING DIAGNOSES: 1.  Failed right total hip arthroplasty. 2.  Hypertension. 3.  Hyperlipidemia. 4.  Hypothyroidism. 5.  Mitral valve prolapse. 6.  History of deep vein thrombosis.  DISCHARGE DIAGNOSES: 1.  Status post revision of right total hip arthroplasty. 2.  Hypertension. 3.  Hyperlipidemia. 4.  Hypothyroidism. 5.  Mitral valve prolapse. 5.  History of deep vein thrombosis.  PROCEDURE:  The patient underwent revision of a right femoral revision with strut allografting.  SURGEON:  Ollen Gross, M.D.  ASSISTANT:  Philips J. Montez Morita, M.D.  CONSULT:  None.  BRIEF HISTORY:  This 77 year old male had a right total hip replacement in 1970.  Since that time, he has had a total of three revisions to the hip. Over the past few months he has had increasing complaints of significant right thigh pain which increases with weight bearing.  He also had complaints of instability.  Mr. Pender had been evaluated at the [facility], and Dr. ______ felt like he needed another revision.  Since Mr. Vandyne resides in [place], he wanted to have the surgery done here.  X-rays showed a grossly loose-cemented long-stem prosthesis which has gone into a significant amount of varus.  Due to the patients significant amount of pain and discomfort and x-ray documentation, it was felt that he would best benefit from surgical intervention.  The risks, benefits and complications were discussed with the patient in detail.  He agreed to proceed.  HOSPITAL COURSE:  On April 04, 2000, the patient was admitted to [facility], underwent the above stated procedure which he tolerated well without complications.  The patient did lose 800 cc of blood  intraoperatively, and had two units of autologous blood transfused intraoperatively.  Postoperatively, the patient had some low blood pressures ranging from 86-100/48-62.  His blood pressure medications were held until his blood pressure was greater than 150/90.  Postoperatively, the patient was started on Coumadin per pharmacy protocol for DVT prophylaxis.  Physical therapy was initiated for total hip precautions and ambulation with his touchdown weight-bearing.  Occupational therapy was initiated postoperatively for ADL.  Other than his postoperative hypotension, the patient did quite well postoperatively.  On April 09, 2000, the patient was without complaints of pain, nausea or vomiting.  He was "ready to go home".  On exam, he was afebrile and his vital signs were stable.  The dressing was dry.  The incision site was clean and dry with no erythema or discharge.  The calf was soft and non-tender.  He was neurovascularly intact to the right lower extremity.  It was felt the patient was stable for discharge at this time.  LABS:  Preoperative CBC was within normal limits with a slightly decreased hemoglobin at 12.1 and hematocrit of 35.3.  Again, the patient did lose 800 cc of blood intraoperatively, and was transfused with two units of autologous blood.  Prior to discharge, the patients hemoglobin was 8.9; his hematocrit was 26.1.  PT/INR preoperatively were within normal limits.  The patient was on  Coumadin therapy per pharmacy protocol, and prior to discharge the patients PT was 21.8 and INR was 2.7.  Routine chemistries preoperatively were within normal limits.  Postoperatively, the patient had a slightly elevated glucose at 127 and a slightly decreased calcium at 8.2.  Preoperative urinalysis was within normal limits.  RADIOLOGY:  Preoperative right hip x-rays revealed loosening of the femoral component of the total hip replacement with migration inferior and laterally with some  impingement of the greater trochanter into his acetabular area. Postoperative hip films revealed a satisfactory redo of right total hip replacement.  Preoperative chest x-ray was negative for acute disease.  CARDIOLOGY:  Preoperative EKG showed some sinus bradycardia with a right bundle branch block.  CONDITION ON DISCHARGE:  Improved and stable.  DISCHARGE PLANS:  The patient is being discharged to home with home health physical therapy.  He will also need home health nursing for protimes while on Coumadin.  He is to remain touchdown weight-bearing on the right lower extremity and is to continue with his total hip precautions.  He may shower ad lib, keeping the incision site clean and dry.  He will need daily dry dressing changes.  He is to resume his home diet and home medications.  DISCHARGE MEDICATIONS: 1.  Percocet 5 mg #40. 2.  Robaxin 500 mg #20. 3.  Coumadin per pharmacy protocol. 4.  The patient may use Colace 100 mg b.i.d.  FOLLOWUP:  The patient is to return to the clinic Friday, April 15, 2000, to follow up with Dr. Lequita Halt.  Please call [phone] and make an appointment or if there are any questions or concerns. DD:  05/06/00 TD:  05/06/00 Job: [id number]

## 2011-03-19 NOTE — Assessment & Plan Note (Signed)
[facility]                         GASTROENTEROLOGY OFFICE NOTE   NAME:Wynter, ELDWIN VOLKOV                        MRN:          [id number]  DATE:11/15/2006                            DOB:          Mar 10, 1932    PROBLEM:  Anemia.   Mr. Soltau is a pleasant 77 year old African-American male referred  through the courtesy of Dr. Jonny Ruiz for evaluation.  He was seen in the  emergency room because of abdominal pain which was felt to be due to the  cholecystitis.  He is scheduled for cholecystectomy.  An incidental  microcytic anemia was noted.  On December 27th hemoglobin was 10.4, MCV  78.9.  Mr. Getman has no GI complaints including change in bowel habits,  abdominal pain, melena or hematochezia.  He has occasional indigestion.  He is on no regular gastric irritants including nonsteroidals.   He underwent a colonoscopy in July, 2004 that was entirely unremarkable.   PAST MEDICAL HISTORY:  1. Pertinent for prostate CA for which he takes Lupron.  2. Hypertension.  3. Mitral valve prolapse.  4. Status post hemorrhoidectomy, hip surgery, shoulder surgery.  5. Suffers from depression.   FAMILY HISTORY:  Pertinent for twin brother with prostate cancer.   MEDICATIONS:  Synthroid, aspirin, Fosamax, Flomax, lisinopril, Celexa,  Protonix and simvastatin and iron.   HE HAS NO ALLERGIES.   He neither smokes or drinks.  He is married, is a Naval architect.   REVIEW OF SYSTEMS:  Positive for joint pains, fatigue and muscle cramps.   PHYSICAL EXAMINATION:  Pulse 62, blood pressure 100/60, weight 248.  HEENT:  EOMI. PERRLA. Sclerae are anicteric.  Conjunctivae are pink.  NECK:  Supple without thyromegaly, adenopathy or carotid bruits.  CHEST:  Clear to auscultation and percussion without adventitious  sounds.  CARDIAC:  Regular rhythm; normal S1 S2.  There are no murmurs, gallops  or rubs.  ABDOMEN:  Bowel sounds are normoactive.  Abdomen is soft, non-tender and  non-distended.  There are no abdominal masses, tenderness, splenic  enlargement or hepatomegaly.  EXTREMITIES:  Full range of motion.  No cyanosis, clubbing or edema.  RECTAL:  Deferred.   IMPRESSION:  Probable iron deficiency anemia.  Sources of  gastrointestinal blood loss need to be ruled out including polyps,  arteriovenous malformation, neoplasm and ulcers.   RECOMMENDATION:  Colonoscopy and upper endoscopy (to be done at the same  time).  If negative, I will obtain serial hemoccults.     Barbette Hair. Arlyce Dice, MD,FACG  Electronically Signed    RDK/MedQ  DD: 11/15/2006  DT: 11/15/2006  Job #: [id number]

## 2011-03-19 NOTE — Assessment & Plan Note (Signed)
[facility]                         GASTROENTEROLOGY OFFICE NOTE   NAME:[name] [name], [name] [name]                        MRN:          [id number]  DATE:12/19/2006                            DOB:          11-19-1931    DATE OF VISIT:  December 19, 2006   PROBLEM:  Iron deficiency anemia.   SUBJECTIVE:  [name] has returned for scheduled GI followup.  Upper  endoscopy and colonoscopy were negative for a GI bleeding source.  [name] has no GI complaints.  Followup  Hemoccult's were negative.  Since  his last visit he underwent a cholecystectomy.   PHYSICAL EXAMINATION:  VITAL SIGNS:  Pulse 62, blood pressure 110/60,  weight 243.   IMPRESSION:  Iron deficiency anemia - of unclear etiology.   RECOMMENDATIONS:  Repeat CBC now and in three months.  I will also  obtain iron studies.  If the iron studies are consistent with him being  iron deficient, and his blood counts are dropping, then I would proceed  with capsule endoscopy.     Barbette Hair. Arlyce Dice, MD,FACG  Electronically Signed    RDK/MedQ  DD: 12/19/2006  DT: 12/19/2006  Job #: [id number]

## 2013-04-09 ENCOUNTER — Telehealth: Payer: Self-pay | Admitting: Gastroenterology

## 2013-04-09 NOTE — Telephone Encounter (Signed)
Removed from Recall Colon Reminder System/Chart reviewed by Dr Arlyce Dice

## 2019-10-31 NOTE — Progress Notes (Signed)
Cardiology Office Note:   Date:  11/01/2019  NAME:  [name] [name]    MRN: [id number] DOB:  [birth year]   PCP:  Kristie Cowman, MD  Cardiologist:  Evalina Field, MD   Referring MD: Kristie Cowman, MD  Chief Complaint  Patient presents with  . Shortness of Breath   History of Present Illness:   [name] [name] is a 85 y.o. male with a hx of hypertension, hypothyroidism, CKD who is being seen today for the evaluation of abnormal EKG at the request of Kristie Cowman, MD.  He was evaluated by his primary care physician for shortness of breath.  Noted to have a right bundle branch block and first-degree AV block.  He was sent here for evaluation of this is the etiology of her shortness of breath.  Recent laboratory data shows a hemoglobin of 9.8.  He reports 6 months of worsening lightheadedness and dizziness upon standing or walking.  He reports he gets these symptoms every day.  He gets them multiple times per day.  He reports he has had intermittent lightheadedness before this but it appears to be worsening.  He reports he gets occasional pain in his neck with this.  He denies any palpitations.  Has not had any frank syncope.  He has a history of hypothyroidism but no recent lab data was sent by his primary care physician.  He is a bit anemic but most recent hemoglobin was stable.  He has never had a history of myocardial infarction or stroke.  He is on Flomax as well as lisinopril.  His blood pressure is quite stable today.  His EKG demonstrates a right bundle branch block with first-degree AV block.  He does not appear to have any high-grade AV block.  No history of diabetes.  No strong family history of cardiovascular disease.  Cardiovascular disease risk factors include hypertension is well as history of smoking.  He quit smoking 40 years ago.  No excessive alcohol use reported.  He lives with his wife who is 5, she is a retired Marine scientist.  He is 87.  They live alone and appear to be doing well.  Past  Medical History: Past Medical History:  Diagnosis Date  . Hypertension   . Hypothyroidism   . Prostate CA ([place])   . Prostate cancer [facility])     Past Surgical History: Past Surgical History:  Procedure Laterality Date  . CHOLECYSTECTOMY    . HIP SURGERY      Current Medications: Current Meds  Medication Sig  . acetaminophen (TYLENOL) 500 MG tablet Take 500 mg by mouth every 6 (six) hours as needed.  Marland Kitchen aspirin 81 MG chewable tablet Chew 81 mg by mouth daily.  Marland Kitchen levothyroxine (SYNTHROID) 137 MCG tablet Take 137 mcg by mouth daily.  . meloxicam (MOBIC) 15 MG tablet Take 15 mg by mouth daily.  . tamsulosin (FLOMAX) 0.4 MG CAPS capsule Take 0.4 mg by mouth daily.  . traMADol (ULTRAM) 50 MG tablet Take 50 mg by mouth every 4 (four) hours as needed.  . Vitamin D, Ergocalciferol, (DRISDOL) 1.25 MG (50000 UT) CAPS capsule Take 50,000 Units by mouth once a week.  . [DISCONTINUED] lisinopril (ZESTRIL) 5 MG tablet Take 5 mg by mouth daily.     Allergies:    Patient has no known allergies.   Social History: Social History   Socioeconomic History  . Marital status: Married    Spouse name: Not on file  . Number of children: 3  .  Years of education: Not on file  . Highest education level: Not on file  Occupational History  . Occupation: retired   Tobacco Use  . Smoking status: Former Smoker    Years: 30.00  . Smokeless tobacco: Never Used  Substance and Sexual Activity  . Alcohol use: Not Currently  . Drug use: Not on file  . Sexual activity: Not on file  Other Topics Concern  . Not on file  Social History Narrative  . Not on file   Social Determinants of Health   Financial Resource Strain:   . Difficulty of Paying Living Expenses: Not on file  Food Insecurity:   . Worried About Charity fundraiser in the Last Year: Not on file  . Ran Out of Food in the Last Year: Not on file  Transportation Needs:   . Lack of Transportation (Medical): Not on file  . Lack of  Transportation (Non-Medical): Not on file  Physical Activity:   . Days of Exercise per Week: Not on file  . Minutes of Exercise per Session: Not on file  Stress:   . Feeling of Stress : Not on file  Social Connections:   . Frequency of Communication with Friends and Family: Not on file  . Frequency of Social Gatherings with Friends and Family: Not on file  . Attends Religious Services: Not on file  . Active Member of Clubs or Organizations: Not on file  . Attends Archivist Meetings: Not on file  . Marital Status: Not on file     Family History: The patient's family history includes Cancer in his mother; Heart disease in his mother; Hypertension in his father; Prostate cancer in his brother.  ROS:   All other ROS reviewed and negative. Pertinent positives noted in the HPI.     EKGs/Labs/Other Studies Reviewed:   The following studies were personally reviewed by me today:  EKG:  EKG is ordered today.  The ekg ordered today demonstrates sinus bradycardia, first-degree AV block, right bundle branch block, inferior Q waves noted, no acute ST-T changes, and was personally reviewed by me.   Recent Labs: No results found for requested labs within last 8760 hours.   Recent Lipid Panel    Component Value Date/Time   CHOL 157 08/25/2009 0000   TRIG 107.0 08/25/2009 0000   HDL 48.00 08/25/2009 0000   CHOLHDL 3 08/25/2009 0000   VLDL 21.4 08/25/2009 0000   LDLCALC 88 08/25/2009 0000    Physical Exam:   VS:  BP 128/69   Pulse (!) 50   Temp (!) 93.2 F (34 C)   Ht 6\' 3"  (1.905 m)   Wt 234 lb (106.1 kg)   SpO2 98%   BMI 29.25 kg/m    Wt Readings from Last 3 Encounters:  11/01/19 234 lb (106.1 kg)    General: Well nourished, well developed, in no acute distress Heart: Atraumatic, normal size  Eyes: PEERLA, EOMI  Neck: Supple, no JVD Endocrine: No thryomegaly Cardiac: Normal S1, S2; RRR; no murmurs, rubs, or gallops Lungs: Clear to auscultation bilaterally, no  wheezing, rhonchi or rales  Abd: Soft, nontender, no hepatomegaly  Ext: No edema, pulses 2+ Musculoskeletal: No deformities, BUE and BLE strength normal and equal Skin: Warm and dry, no rashes   Neuro: Alert and oriented to person, place, time, and situation, CNII-XII grossly intact, no focal deficits  Psych: Normal mood and affect   ASSESSMENT:   [name] [name] is a 85 y.o. male who  presents for the following: 1. SOB (shortness of breath)   2. Lightheadedness   3. Nonspecific abnormal electrocardiogram (ECG) (EKG)   4. Dizziness   5. Essential hypertension   6. Precordial pain     PLAN:   1. SOB (shortness of breath) 2. Lightheadedness 3. Nonspecific abnormal electrocardiogram (ECG) (EKG) 4. Dizziness -His symptoms appear to represent orthostatic hypotension.  They occur when standing and trying to walk.  I do wonder about the combination of Flomax and lisinopril.  We will stop his lisinopril today.  His blood pressure is well controlled. -He does have a right bundle branch block with first-degree AV block.  No evidence of high-grade AV conduction.  I do not suspect this is the etiology here.  We will obtain an echocardiogram as well as Lexiscan nuclear medicine stress test to ensure he has no structural heart disease or underlying CAD to explain his symptoms. -He should avoid AV nodal agents given the right bundle branch block and first-degree AV block. -We will also check a CBC, BMP, BNP, TSH to exclude anemia or heart failure as etiology here.  He does have a history of hypothyroidism we will make sure he is adequately treated for this.  -We may end up needing to check a monitor to make sure he has no significant conduction disease.  We will start with the above work-up for now.  5. Essential hypertension -Stop lisinopril as above  6. Precordial pain -Nuclear medicine stress test as above  Disposition: Return in about 1 month (around 12/02/2019).  Medication Adjustments/Labs  and Tests Ordered: Current medicines are reviewed at length with the patient today.  Concerns regarding medicines are outlined above.  Orders Placed This Encounter  Procedures  . Basic Metabolic Panel (BMET)  . B Nat Peptide  . CBC  . TSH  . Myocardial Perfusion Imaging  . EKG 12-Lead  . ECHOCARDIOGRAM COMPLETE   No orders of the defined types were placed in this encounter.   Patient Instructions  Medication Instructions:  STOP LISINOPRIL *If you need a refill on your cardiac medications before your next appointment, please call your pharmacy*  Lab Work: Your physician recommends that you return for lab work today (BNP, BMP, TSH, CBC)  If you have labs (blood work) drawn today and your tests are completely normal, you will receive your results only by: Marland Kitchen MyChart Message (if you have MyChart) OR . A paper copy in the mail If you have any lab test that is abnormal or we need to change your treatment, we will call you to review the results.  Testing/Procedures: Your physician has requested that you have an echocardiogram. Echocardiography is a painless test that uses sound waves to create images of your heart. It provides your doctor with information about the size and shape of your heart and how well your heart's chambers and valves are working. This procedure takes approximately one hour. There are no restrictions for this procedure.  Your physician has requested that you have a lexiscan myoview. For further information please visit HugeFiesta.[email or web address]. Please follow instruction sheet, as given. Do not eat or drink within 3 hours of your lexiscan. Do not consume Caffeine within 12 hours of your lexiscan.  Follow-Up: At [facility], you and your health needs are our priority.  As part of our continuing mission to provide you with exceptional heart care, we have created designated Provider Care Teams.  These Care Teams include your primary Cardiologist (physician) and Advanced  Practice Providers (APPs -  Physician Assistants and Nurse Practitioners) who all work together to provide you with the care you need, when you need it.  Your next appointment:   1 month(s)  The format for your next appointment:   In Person  Provider:   Eleonore Chiquito, MD      Signed, Addison Naegeli. Audie Box, [place]  [address] [place], [place] [address] [phone]  11/01/2019 4:10 PM

## 2019-11-01 ENCOUNTER — Encounter: Payer: Self-pay | Admitting: Cardiovascular Disease

## 2019-11-01 ENCOUNTER — Ambulatory Visit: Payer: Medicare Other | Admitting: Cardiovascular Disease

## 2019-11-01 ENCOUNTER — Other Ambulatory Visit: Payer: Self-pay

## 2019-11-01 VITALS — BP 128/69 | HR 50 | Temp 93.2°F | Ht 75.0 in | Wt 234.0 lb

## 2019-11-01 DIAGNOSIS — R0602 Shortness of breath: Secondary | ICD-10-CM | POA: Diagnosis not present

## 2019-11-01 DIAGNOSIS — I10 Essential (primary) hypertension: Secondary | ICD-10-CM

## 2019-11-01 DIAGNOSIS — R9431 Abnormal electrocardiogram [ECG] [EKG]: Secondary | ICD-10-CM

## 2019-11-01 DIAGNOSIS — R42 Dizziness and giddiness: Secondary | ICD-10-CM | POA: Diagnosis not present

## 2019-11-01 DIAGNOSIS — R072 Precordial pain: Secondary | ICD-10-CM

## 2019-11-01 NOTE — Patient Instructions (Signed)
Medication Instructions:  STOP LISINOPRIL *If you need a refill on your cardiac medications before your next appointment, please call your pharmacy*  Lab Work: Your physician recommends that you return for lab work today (BNP, BMP, TSH, CBC)  If you have labs (blood work) drawn today and your tests are completely normal, you will receive your results only by: Marland Kitchen MyChart Message (if you have MyChart) OR . A paper copy in the mail If you have any lab test that is abnormal or we need to change your treatment, we will call you to review the results.  Testing/Procedures: Your physician has requested that you have an echocardiogram. Echocardiography is a painless test that uses sound waves to create images of your heart. It provides your doctor with information about the size and shape of your heart and how well your heart's chambers and valves are working. This procedure takes approximately one hour. There are no restrictions for this procedure.  Your physician has requested that you have a lexiscan myoview. For further information please visit HugeFiesta.[email or web address]. Please follow instruction sheet, as given. Do not eat or drink within 3 hours of your lexiscan. Do not consume Caffeine within 12 hours of your lexiscan.  Follow-Up: At [facility], you and your health needs are our priority.  As part of our continuing mission to provide you with exceptional heart care, we have created designated Provider Care Teams.  These Care Teams include your primary Cardiologist (physician) and Advanced Practice Providers (APPs -  Physician Assistants and Nurse Practitioners) who all work together to provide you with the care you need, when you need it.  Your next appointment:   1 month(s)  The format for your next appointment:   In Person  Provider:   Eleonore Chiquito, MD

## 2019-11-02 LAB — CBC
Hematocrit: 32.4 % — ABNORMAL LOW (ref 37.5–51.0)
Hemoglobin: 10 g/dL — ABNORMAL LOW (ref 13.0–17.7)
MCH: 23.3 pg — ABNORMAL LOW (ref 26.6–33.0)
MCHC: 30.9 g/dL — ABNORMAL LOW (ref 31.5–35.7)
MCV: 75 fL — ABNORMAL LOW (ref 79–97)
Platelets: 212 10*3/uL (ref 150–450)
RBC: 4.3 x10E6/uL (ref 4.14–5.80)
RDW: 14.8 % (ref 11.6–15.4)
WBC: 4.3 10*3/uL (ref 3.4–10.8)

## 2019-11-02 LAB — BASIC METABOLIC PANEL
BUN/Creatinine Ratio: 15 (ref 10–24)
BUN: 19 mg/dL (ref 8–27)
CO2: 19 mmol/L — ABNORMAL LOW (ref 20–29)
Calcium: 9 mg/dL (ref 8.6–10.2)
Chloride: 106 mmol/L (ref 96–106)
Creatinine, Ser: 1.28 mg/dL — ABNORMAL HIGH (ref 0.76–1.27)
GFR calc Af Amer: 59 mL/min/{1.73_m2} — ABNORMAL LOW (ref >59)
GFR calc non Af Amer: 51 mL/min/{1.73_m2} — ABNORMAL LOW (ref >59)
Glucose: 75 mg/dL (ref 65–99)
Potassium: 4.4 mmol/L (ref 3.5–5.2)
Sodium: 139 mmol/L (ref 134–144)

## 2019-11-02 LAB — TSH: TSH: 0.692 u[IU]/mL (ref 0.450–4.500)

## 2019-11-02 LAB — BRAIN NATRIURETIC PEPTIDE: BNP: 394.3 pg/mL — ABNORMAL HIGH (ref 0.0–100.0)

## 2019-11-06 ENCOUNTER — Telehealth (HOSPITAL_COMMUNITY): Payer: Self-pay

## 2019-11-06 NOTE — Telephone Encounter (Signed)
Encounter complete. 

## 2019-11-07 ENCOUNTER — Ambulatory Visit (HOSPITAL_COMMUNITY)
Admission: RE | Admit: 2019-11-07 | Discharge: 2019-11-07 | Disposition: A | Discharge status: Home / Self Care | Payer: Medicare Other | Source: Ambulatory Visit | Attending: Cardiology | Admitting: Cardiology

## 2019-11-07 ENCOUNTER — Other Ambulatory Visit: Payer: Self-pay

## 2019-11-07 DIAGNOSIS — R072 Precordial pain: Secondary | ICD-10-CM | POA: Insufficient documentation

## 2019-11-07 DIAGNOSIS — R0602 Shortness of breath: Secondary | ICD-10-CM | POA: Diagnosis not present

## 2019-11-07 LAB — MYOCARDIAL PERFUSION IMAGING
LV dias vol: 152 mL (ref 62–150)
LV sys vol: 79 mL
Peak HR: 59 {beats}/min
Rest HR: 43 {beats}/min
SDS: 1
SRS: 2
SSS: 3
TID: 1.11

## 2019-11-07 MED ORDER — TECHNETIUM TC 99M TETROFOSMIN IV KIT
32.0000 | PACK | Freq: Once | INTRAVENOUS | Status: AC | PRN
  Administered 2019-11-07: 32 via INTRAVENOUS
  Filled 2019-11-07: qty 32

## 2019-11-07 MED ORDER — REGADENOSON 0.4 MG/5ML IV SOLN
0.4000 mg | Freq: Once | INTRAVENOUS | Status: AC
  Administered 2019-11-07: 0.4 mg via INTRAVENOUS

## 2019-11-07 MED ORDER — TECHNETIUM TC 99M TETROFOSMIN IV KIT
9.2000 | PACK | Freq: Once | INTRAVENOUS | Status: AC | PRN
  Administered 2019-11-07: 9.2 via INTRAVENOUS
  Filled 2019-11-07: qty 10

## 2019-11-09 ENCOUNTER — Telehealth: Payer: Self-pay | Admitting: Cardiovascular Disease

## 2019-11-09 MED ORDER — FUROSEMIDE 20 MG PO TABS: 20.0000 mg | ORAL_TABLET | Freq: Every day | ORAL | 3 refills | Status: DC

## 2019-11-09 NOTE — Telephone Encounter (Signed)
Called Mr. Thoennes about recent lab work. BNP up. Stress test normal. Will start lasix 20 mg QD for likely CHF. Echo pending.   [place] T. Audie Box, [place]  [address], [place], [place] [address] [phone]  10:04 AM

## 2019-11-14 ENCOUNTER — Telehealth: Payer: Self-pay | Admitting: Cardiovascular Disease

## 2019-11-14 NOTE — Telephone Encounter (Signed)
Called pt wife and made her aware of the visitor restrictions [place] has currently. Verbalized understanding.

## 2019-11-14 NOTE — Telephone Encounter (Signed)
Patient's wife is calling requesting she attend the patients upcoming Echo scheduled for 11/15/19. Please advise.

## 2019-11-15 ENCOUNTER — Ambulatory Visit (HOSPITAL_COMMUNITY): Payer: Medicare Other | Attending: Cardiology

## 2019-11-15 ENCOUNTER — Other Ambulatory Visit: Payer: Self-pay

## 2019-11-15 DIAGNOSIS — R0602 Shortness of breath: Secondary | ICD-10-CM | POA: Insufficient documentation

## 2019-11-22 NOTE — Progress Notes (Signed)
Cardiology Office Note:   Date:  11/23/2019  NAME:  [name] [name]    MRN: [id number] DOB:  [birth year]   PCP:  Kristie Cowman, MD  Cardiologist:  Evalina Field, MD   Referring MD: Kristie Cowman, MD   Chief Complaint  Patient presents with  . Follow-up  . Shortness of Breath    History of Present Illness:   [name] [name] is a 86 y.o. male with a hx of HFpEF, right bundle branch block, hypertension, CKD, diabetes who presents for follow-up of shortness of breath.  Evaluated on 11/01/2019 for shortness of breath and dizziness upon standing.  Blood pressure was a bit low and we stopped his lisinopril.  His BNP value was elevated and his echocardiogram showed preserved left ventricular function.  He was started on Lasix.  Nuclear medicine stress test without ischemia.  He reports he still getting dizzy and short of breath when standing.  Symptoms appear to be worse in the morning.  He reports initially upon standing and with taking several steps he becomes dizzy and lightheaded.  He is felt like he was going to pass out several times.  He is not had any overt syncope.  His blood pressure is a bit better today on lisinopril.  He has been taking Lasix 20 mg daily without any improvement.  He has no evidence of volume overload on examination today.  Heart rate today is in the 60s.  He reports he is dealing with some issues with stomach pain.  His primary care physician is treating him with magnesium for gastritis.  There is also a question of iron deficiency and he is started iron supplementation.  No other recent medication changes.  He does report intermittent neck pain but denies any chest pain with exertion.  I did go over the results of his recent test which showed a normal echocardiogram as well as normal nuclear medicine myocardial perfusion imaging.  Problem List 1. HFpEF, EF 55-60%, elevated BNP 2. Normal MPI 11/2019 3. 1AVB/RBBB 4. HTN 5. CKD 3 6. Diabetes   Past Medical History: Past  Medical History:  Diagnosis Date  . Hypertension   . Hypothyroidism   . Prostate CA ([place])   . Prostate cancer [facility])     Past Surgical History: Past Surgical History:  Procedure Laterality Date  . CHOLECYSTECTOMY    . HIP SURGERY      Current Medications: Current Meds  Medication Sig  . acetaminophen (TYLENOL) 500 MG tablet Take 500 mg by mouth every 6 (six) hours as needed.  Marland Kitchen aspirin 81 MG chewable tablet Chew 81 mg by mouth daily.  Marland Kitchen esomeprazole (NEXIUM) 40 MG capsule Take 40 mg by mouth daily.  . ferrous gluconate (FERGON) 324 MG tablet Take 648 mg by mouth daily.  . furosemide (LASIX) 20 MG tablet Take 1 tablet (20 mg total) by mouth daily.  Marland Kitchen levothyroxine (SYNTHROID) 137 MCG tablet Take 137 mcg by mouth daily.  . meloxicam (MOBIC) 15 MG tablet Take 15 mg by mouth daily.  . tamsulosin (FLOMAX) 0.4 MG CAPS capsule Take 0.4 mg by mouth daily.  Marland Kitchen terbinafine (LAMISIL) 250 MG tablet Take 250 mg by mouth daily.  . traMADol (ULTRAM) 50 MG tablet Take 50 mg by mouth every 4 (four) hours as needed.  . Vitamin D, Ergocalciferol, (DRISDOL) 1.25 MG (50000 UT) CAPS capsule Take 50,000 Units by mouth once a week.     Allergies:    Patient has no known allergies.  Social History: Social History   Socioeconomic History  . Marital status: Married    Spouse name: Not on file  . Number of children: 3  . Years of education: Not on file  . Highest education level: Not on file  Occupational History  . Occupation: retired   Tobacco Use  . Smoking status: Former Smoker    Years: 30.00  . Smokeless tobacco: Never Used  Substance and Sexual Activity  . Alcohol use: Not Currently  . Drug use: Not on file  . Sexual activity: Not on file  Other Topics Concern  . Not on file  Social History Narrative  . Not on file   Social Determinants of Health   Financial Resource Strain:   . Difficulty of Paying Living Expenses: Not on file  Food Insecurity:   . Worried About Ship broker in the Last Year: Not on file  . Ran Out of Food in the Last Year: Not on file  Transportation Needs:   . Lack of Transportation (Medical): Not on file  . Lack of Transportation (Non-Medical): Not on file  Physical Activity:   . Days of Exercise per Week: Not on file  . Minutes of Exercise per Session: Not on file  Stress:   . Feeling of Stress : Not on file  Social Connections:   . Frequency of Communication with Friends and Family: Not on file  . Frequency of Social Gatherings with Friends and Family: Not on file  . Attends Religious Services: Not on file  . Active Member of Clubs or Organizations: Not on file  . Attends Archivist Meetings: Not on file  . Marital Status: Not on file     Family History: The patient's family history includes Cancer in his mother; Heart disease in his mother; Hypertension in his father; Prostate cancer in his brother.  ROS:   All other ROS reviewed and negative. Pertinent positives noted in the HPI.     EKGs/Labs/Other Studies Reviewed:   The following studies were personally reviewed by me today:  TTE 11/15/2019  1. Left ventricular ejection fraction, by visual estimation, is 55 to 60%. The left ventricle has normal function. There is mildly increased left ventricular hypertrophy.  2. Left ventricular diastolic parameters are indeterminate.  3. The left ventricle has no regional wall motion abnormalities.  4. Global right ventricle has normal systolic function.The right ventricular size is normal. No increase in right ventricular wall thickness.  5. Left atrial size was normal.  6. Right atrial size was normal.  7. The mitral valve is normal in structure. Trivial mitral valve regurgitation.  8. The tricuspid valve is normal in structure.  9. The aortic valve is tricuspid. Aortic valve regurgitation is not visualized. Mild aortic valve sclerosis without stenosis. 10. Pulmonic regurgitation is mild. 11. The pulmonic valve was  normal in structure. Pulmonic valve regurgitation is mild. 12. Normal pulmonary artery systolic pressure. 13. The inferior vena cava is dilated in size with >50% respiratory variability, suggesting right atrial pressure of 8 mmHg.  NM Stress 11/07/2019  Nuclear stress EF: 48%. Visually the ejection fraction appears to be greater than the 48% calculated by the computer and appears to be normal.  There was no ST segment deviation noted during stress.  This is a low risk study. There is no evidence of ischemia and no evidence of previous infarction.  The study is normal.  Recent Labs: 11/01/2019: BNP 394.3; BUN 19; Creatinine, Ser 1.28; Hemoglobin 10.0;  Platelets 212; Potassium 4.4; Sodium 139; TSH 0.692   Recent Lipid Panel    Component Value Date/Time   CHOL 157 08/25/2009 0000   TRIG 107.0 08/25/2009 0000   HDL 48.00 08/25/2009 0000   CHOLHDL 3 08/25/2009 0000   VLDL 21.4 08/25/2009 0000   LDLCALC 88 08/25/2009 0000    Physical Exam:   VS:  BP (!) 146/72   Pulse 60   Ht 6\' 4"  (1.93 m)   Wt 227 lb 4.8 oz (103.1 kg)   SpO2 100%   BMI 27.67 kg/m    Wt Readings from Last 3 Encounters:  11/23/19 227 lb 4.8 oz (103.1 kg)  11/07/19 234 lb (106.1 kg)  11/01/19 234 lb (106.1 kg)    General: Well nourished, well developed, in no acute distress Heart: Atraumatic, normal size  Eyes: PEERLA, EOMI  Neck: Supple, no JVD Endocrine: No thryomegaly Cardiac: Normal S1, S2; RRR; no murmurs, rubs, or gallops Lungs: Clear to auscultation bilaterally, no wheezing, rhonchi or rales  Abd: Soft, nontender, no hepatomegaly  Ext: No edema, pulses 2+ Musculoskeletal: No deformities, BUE and BLE strength normal and equal Skin: Warm and dry, no rashes   Neuro: Alert and oriented to person, place, time, and situation, CNII-XII grossly intact, no focal deficits  Psych: Normal mood and affect   ASSESSMENT:   [name] [name] is a 86 y.o. male who presents for the following: 1. SOB (shortness of  breath)   2. Dizziness   3. Chronic diastolic heart failure ([place])   4. Essential hypertension   5. RBBB     PLAN:   1. SOB (shortness of breath) 2. Dizziness -He continues have symptoms of dizziness shortness of breath upon standing.  I think his symptoms are more likely related to orthostasis.  We have held his lisinopril and his blood pressure is better today.  He has no evidence of volume overload and he may not have diastolic heart failure.  We will also take Lasix as needed.  This diagnosis was based on elevated BNP.  He does have a first-degree AV block and a right bundle branch block.  His pulse has been in the 60s here.  I think to complete his work-up we will proceed with a 3-day ZIO patch.  This will exclude any significant bradyarrhythmias to explain his symptoms.  We will also start compression stockings.  I think this will be important and may help with his symptoms.  It does appear that his symptoms are better in the afternoon after he is hydrated.  He will continue with good hydration and very conservative approaches.  His records were not received in the primary care physician office with Dr. Kristie Cowman (fax number [phone].  We will make sure these results are sent today.  He has had a unremarkable echocardiogram and normal myocardial perfusion imaging study.  Overall, symptoms are pointing toward orthostasis.  3. Chronic diastolic heart failure ([facility]) -Normal left ventricular ejection fraction.  Did have elevated BNP at her last visit.  No symptoms of heart failure and no volume overload on examination.  I think given the symptoms of orthostasis that are worrisome, we should plan to take Lasix as needed.  4. Essential hypertension -Blood pressure is much better today.  I think given the concern for orthostasis we will continue to hold his lisinopril.  He should avoid any AV nodal agents due to his underlying conduction disease.  We will see how he does with compression  stockings and more  lenient blood pressure control.  5. RBBB -EKG really not that remarkable.  We will proceed with a 3-day ZIO patch as above to exclude any significant bradycardia.  Disposition: Return in about 2 months (around 01/21/2020).  Medication Adjustments/Labs and Tests Ordered: Current medicines are reviewed at length with the patient today.  Concerns regarding medicines are outlined above.  Orders Placed This Encounter  Procedures  . Compression stockings  . LONG TERM MONITOR (3-14 DAYS)   No orders of the defined types were placed in this encounter.   Patient Instructions  Medication Instructions:  The current medical regimen is effective;  continue present plan and medications.  *If you need a refill on your cardiac medications before your next appointment, please call your pharmacy*  Testing/Procedures: Your physician has recommended that you wear a 3 DAY ZIO-PATCH monitor. The Zio patch cardiac monitor continuously records heart rhythm data for up to 14 days, this is for patients being evaluated for multiple types heart rhythms. For the first 24 hours post application, please avoid getting the Zio monitor wet in the shower or by excessive sweating during exercise. After that, feel free to carry on with regular activities. Keep soaps and lotions away from the ZIO XT Patch.  This will be mailed to you, please expect 7-10 days to receive.         Follow-Up: At [facility], you and your health needs are our priority.  As part of our continuing mission to provide you with exceptional heart care, we have created designated Provider Care Teams.  These Care Teams include your primary Cardiologist (physician) and Advanced Practice Providers (APPs -  Physician Assistants and Nurse Practitioners) who all work together to provide you with the care you need, when you need it.  Your next appointment:   2 month(s)  The format for your next appointment:   In  Person  Provider:   Eleonore Chiquito, MD  Other Instructions Wear compression stockings.     Signed, Addison Naegeli. Audie Box, [place]  [address] [place], [place] [address] [phone]  11/23/2019 9:04 AM

## 2019-11-23 ENCOUNTER — Other Ambulatory Visit: Payer: Self-pay

## 2019-11-23 ENCOUNTER — Encounter: Payer: Self-pay | Admitting: Cardiovascular Disease

## 2019-11-23 ENCOUNTER — Ambulatory Visit: Payer: Medicare Other | Admitting: Cardiovascular Disease

## 2019-11-23 ENCOUNTER — Telehealth: Payer: Self-pay | Admitting: Radiology

## 2019-11-23 VITALS — BP 146/72 | HR 60 | Ht 76.0 in | Wt 227.3 lb

## 2019-11-23 DIAGNOSIS — I10 Essential (primary) hypertension: Secondary | ICD-10-CM | POA: Diagnosis not present

## 2019-11-23 DIAGNOSIS — I4510 Unspecified right bundle-branch block: Secondary | ICD-10-CM

## 2019-11-23 DIAGNOSIS — I5032 Chronic diastolic (congestive) heart failure: Secondary | ICD-10-CM

## 2019-11-23 DIAGNOSIS — R0602 Shortness of breath: Secondary | ICD-10-CM | POA: Diagnosis not present

## 2019-11-23 DIAGNOSIS — R42 Dizziness and giddiness: Secondary | ICD-10-CM | POA: Diagnosis not present

## 2019-11-23 NOTE — Patient Instructions (Signed)
Medication Instructions:  The current medical regimen is effective;  continue present plan and medications.  *If you need a refill on your cardiac medications before your next appointment, please call your pharmacy*  Testing/Procedures: Your physician has recommended that you wear a 3 DAY ZIO-PATCH monitor. The Zio patch cardiac monitor continuously records heart rhythm data for up to 14 days, this is for patients being evaluated for multiple types heart rhythms. For the first 24 hours post application, please avoid getting the Zio monitor wet in the shower or by excessive sweating during exercise. After that, feel free to carry on with regular activities. Keep soaps and lotions away from the ZIO XT Patch.  This will be mailed to you, please expect 7-10 days to receive.         Follow-Up: At [facility], you and your health needs are our priority.  As part of our continuing mission to provide you with exceptional heart care, we have created designated Provider Care Teams.  These Care Teams include your primary Cardiologist (physician) and Advanced Practice Providers (APPs -  Physician Assistants and Nurse Practitioners) who all work together to provide you with the care you need, when you need it.  Your next appointment:   2 month(s)  The format for your next appointment:   In Person  Provider:   Eleonore Chiquito, MD  Other Instructions Wear compression stockings.

## 2019-11-23 NOTE — Telephone Encounter (Signed)
Enrolled patient for a 3 day Zio monitor to be mailed to patients home.  

## 2019-11-27 ENCOUNTER — Ambulatory Visit (INDEPENDENT_AMBULATORY_CARE_PROVIDER_SITE_OTHER): Payer: Medicare Other

## 2019-11-27 DIAGNOSIS — R42 Dizziness and giddiness: Secondary | ICD-10-CM

## 2020-01-21 ENCOUNTER — Ambulatory Visit: Payer: Medicare Other | Admitting: Cardiovascular Disease

## 2020-01-23 NOTE — Progress Notes (Signed)
Cardiology Office Note:   Date:  01/24/2020  NAME:  [name] [name]    MRN: [id number] DOB:  28-Apr-1932   PCP:  Kristie Cowman, MD  Cardiologist:  Evalina Field, MD  Electrophysiologist:  None   Referring MD: Kristie Cowman, MD   Chief Complaint  Patient presents with  . Follow-up    2 months.  . Headache  . Shortness of Breath   History of Present Illness:   [name] [name] is a 86 y.o. male with a hx of HFpEF, CKD 3, HTN, RBBB/1AVB, Diabetes who presents for follow-up of SOB/dizziness.  Work-up thus far has demonstrated diastolic heart failure with elevated BNP.  He had a normal nuclear medicine stress test.  He did complete a 3-day Zio patch which did show significant sinus bradycardia down to 21 and this was in the setting of sinus pauses up to 3 seconds and winky block.  There were no symptoms reported the time of this.  His heart rate did increase up to the 150s.  He reports his been doing well.  He is getting less short of breath with activity.  We did hold his blood pressure medications at her last visit.  This seems to be helping.  He is also lost weight recently.  No increased volume on him today.  He reports about 1-2 times per day when walking he can get dizzy and lightheaded.  Symptoms last seconds and resolve without intervention.  He reports no chest pain, shortness of breath or palpitations today.  He has had no syncope.  His EKG does show progression of his conduction disease.  When I saw him last he had a first-degree AV block with only a right bundle branch block.  His EKG today now shows trifascicular block with first-degree AV block up to 40 ms, right bundle branch block and left anterior fascicular block.  I did go over his ZIO with him.  He did not have any syncope or overt symptoms when he was wearing it.  I think it is best to put him on a treadmill to make sure he does not have chronotropic incompetence.  I suspect he will need a pacemaker moving forward but not currently.   Of note, he is currently anemic with a hemoglobin of 10.  He was started on iron supplementation by his primary care physician.  There is no melena or bleeding reported from his stool.  No plans for colonoscopy per his report.  Problem List 1. HFpEF, EF 55-60%, elevated BNP -Normal MPI 11/2019 2.  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 HTN 4. CKD 3 5. Diabetes   Past Medical History: Past Medical History:  Diagnosis Date  . Hypertension   . Hypothyroidism   . Prostate CA ([place])   . Prostate cancer [facility])     Past Surgical History: Past Surgical History:  Procedure Laterality Date  . CHOLECYSTECTOMY    . HIP SURGERY      Current Medications: Current Meds  Medication Sig  . acetaminophen (TYLENOL) 500 MG tablet Take 500 mg by mouth every 6 (six) hours as needed.  Marland Kitchen aspirin 81 MG chewable tablet Chew 81 mg by mouth daily.  . Cholecalciferol (VITAMIN D3) 25 MCG (1000 UT) CAPS Take 1,000 Units by mouth daily.  Marland Kitchen esomeprazole (NEXIUM) 40 MG capsule Take 40 mg by mouth daily.  . ferrous gluconate (FERGON) 324 MG tablet Take 648 mg by mouth daily.  Marland Kitchen  levothyroxine (SYNTHROID) 137 MCG tablet Take 137 mcg by mouth daily.  . meloxicam (MOBIC) 15 MG tablet Take 15 mg by mouth daily.  . tamsulosin (FLOMAX) 0.4 MG CAPS capsule Take 0.4 mg by mouth daily.  . traMADol (ULTRAM) 50 MG tablet Take 50 mg by mouth every 4 (four) hours as needed.  . Vitamin D, Ergocalciferol, (DRISDOL) 1.25 MG (50000 UT) CAPS capsule Take 50,000 Units by mouth once a week.  . [DISCONTINUED] furosemide (LASIX) 20 MG tablet Take 1 tablet (20 mg total) by mouth daily.  . [DISCONTINUED] terbinafine (LAMISIL) 250 MG tablet Take 250 mg by mouth daily.     Allergies:    Patient has no known allergies.   Social History: Social History   Socioeconomic History  . Marital status: Married    Spouse name: Not on file  . Number of children: 3    . Years of education: Not on file  . Highest education level: Not on file  Occupational History  . Occupation: retired   Tobacco Use  . Smoking status: Former Smoker    Years: 30.00  . Smokeless tobacco: Never Used  Substance and Sexual Activity  . Alcohol use: Not Currently  . Drug use: Not on file  . Sexual activity: Not on file  Other Topics Concern  . Not on file  Social History Narrative  . Not on file   Social Determinants of Health   Financial Resource Strain:   . Difficulty of Paying Living Expenses:   Food Insecurity:   . Worried About Charity fundraiser in the Last Year:   . Arboriculturist in the Last Year:   Transportation Needs:   . Film/video editor (Medical):   Marland Kitchen Lack of Transportation (Non-Medical):   Physical Activity:   . Days of Exercise per Week:   . Minutes of Exercise per Session:   Stress:   . Feeling of Stress :   Social Connections:   . Frequency of Communication with Friends and Family:   . Frequency of Social Gatherings with Friends and Family:   . Attends Religious Services:   . Active Member of Clubs or Organizations:   . Attends Archivist Meetings:   Marland Kitchen Marital Status:      Family History: The patient's family history includes Cancer in his mother; Heart disease in his mother; Hypertension in his father; Prostate cancer in his brother.  ROS:   All other ROS reviewed and negative. Pertinent positives noted in the HPI.     EKGs/Labs/Other Studies Reviewed:   The following studies were personally reviewed by me today:  EKG:  EKG is ordered today.  The ekg ordered today demonstrates sinus bradycardia, heart rate 51, first-degree AV block to 40 ms, right bundle branch block with left anterior fascicular block (trifascicular block), and was personally reviewed by me.   Zio 12/12/2019 Enrollment period 11/27/2019-11/29/2019 (2 days). Patient had a min HR of 21 bpm (sinus bradycardia, Wenckebach), max HR of 144 bpm (sinus  tachycardia), and avg HR of 50 bpm (sinus bradycardia). Predominant underlying rhythm was Sinus Rhythm. First Degree AV Block was present. Bundle Branch Block/IVCD was present. 1 run of Non-Sustained Ventricular Tachycardia occurred lasting 4 beats with a max rate of 144 bpm (avg 136 bpm). 9 Pauses occurred, the longest lasting 3.4 secs (17 bpm). Second Degree AV Block-Mobitz I (Wenckebach) was present. Isolated SVEs were rare (<1.0%), and no SVE Couplets or SVE Triplets were present. Isolated VEs were rare (<  1.0%), and no VE Couplets or VE Triplets were present. No atrial fibrillation. No diary submitted.   Impression:  1. Sinus bradycardia with intermittent Wenckebach.  2. Baseline 1AVB noted. 3. 9 sinus pauses in setting of Wenckebach up to 3.4 seconds. No symptoms reported.  4. No atrial fibrillation.   TTE 11/15/2019 1. Left ventricular ejection fraction, by visual estimation, is 55 to 60%. The left ventricle has normal function. There is mildly increased left ventricular hypertrophy. 2. Left ventricular diastolic parameters are indeterminate. 3. The left ventricle has no regional wall motion abnormalities. 4. Global right ventricle has normal systolic function.The right ventricular size is normal. No increase in right ventricular wall thickness. 5. Left atrial size was normal. 6. Right atrial size was normal. 7. The mitral valve is normal in structure. Trivial mitral valve regurgitation. 8. The tricuspid valve is normal in structure. 9. The aortic valve is tricuspid. Aortic valve regurgitation is not visualized. Mild aortic valve sclerosis without stenosis. 10. Pulmonic regurgitation is mild. 11. The pulmonic valve was normal in structure. Pulmonic valve regurgitation is mild. 12. Normal pulmonary artery systolic pressure. 13. The inferior vena cava is dilated in size with >50% respiratory variability, suggesting right atrial pressure of 8 mmHg.  NM Stress 11/07/2019  Nuclear  stress EF: 48%. Visually the ejection fraction appears to be greater than the 48% calculated by the computer and appears to be normal.  There was no ST segment deviation noted during stress.  This is a low risk study. There is no evidence of ischemia and no evidence of previous infarction.  The study is normal.     Recent Labs: 11/01/2019: BNP 394.3; BUN 19; Creatinine, Ser 1.28; Hemoglobin 10.0; Platelets 212; Potassium 4.4; Sodium 139; TSH 0.692   Recent Lipid Panel    Component Value Date/Time   CHOL 157 08/25/2009 0000   TRIG 107.0 08/25/2009 0000   HDL 48.00 08/25/2009 0000   CHOLHDL 3 08/25/2009 0000   VLDL 21.4 08/25/2009 0000   LDLCALC 88 08/25/2009 0000    Physical Exam:   VS:  BP 126/60 (BP Location: Left Arm, Patient Position: Sitting, Cuff Size: Large)   Pulse (!) 51   Ht 6\' 4"  (1.93 m)   Wt 225 lb (102.1 kg)   BMI 27.39 kg/m    Wt Readings from Last 3 Encounters:  01/24/20 225 lb (102.1 kg)  11/23/19 227 lb 4.8 oz (103.1 kg)  11/07/19 234 lb (106.1 kg)    General: Well nourished, well developed, in no acute distress Heart: Atraumatic, normal size  Eyes: PEERLA, EOMI  Neck: Supple, no JVD Endocrine: No thryomegaly Cardiac: Normal S1, S2; RRR; no murmurs, rubs, or gallops Lungs: Clear to auscultation bilaterally, no wheezing, rhonchi or rales  Abd: Soft, nontender, no hepatomegaly  Ext: No edema, pulses 2+ Musculoskeletal: No deformities, BUE and BLE strength normal and equal Skin: Warm and dry, no rashes   Neuro: Alert and oriented to person, place, time, and situation, CNII-XII grossly intact, no focal deficits  Psych: Normal mood and affect   ASSESSMENT:   [name] [name] is a 86 y.o. male who presents for the following: 1. SOB (shortness of breath)   2. Chronic diastolic heart failure ([facility])   3. Dizziness   4. Trifascicular block   5. Sinus bradycardia   6. Essential hypertension     PLAN:   1. SOB (shortness of breath) 2. Chronic diastolic  heart failure ([facility]) --Symptoms of shortness of breath have improved.  He did have  an elevated BNP but no overt volume overload on my examination today.  He is lost weight as well.  No reason for Lasix at this time.  Symptoms have improved since stopping blood pressure medications.  We will plan to keep an eye on this for now. -He had a normal nuclear medicine stress test.  Low risk and low suspicion for obstructive CAD.  3. Dizziness 4. Trifascicular block 5. Sinus bradycardia -His symptoms of shortness of breath have improved but he still gets dizzy 2 times per day.  Symptoms last seconds and resolve without intervention.  His monitor did show that his heart rate can get down into the 20s briefly with sinus pauses up to 3.4 seconds.  EKG today demonstrates trifascicular block.  The left anterior fascicular block is new.  No overt symptoms are alarming need for pacemaker at this time.  I think we should proceed with an exercise ECG treadmill stress test to rule out chronotropic incompetence.  I will also have him evaluated by electrophysiology.  He will avoid any beta-blockers or calcium channel blockers moving forward.  Ultimately, I think he will ultimately need a pacemaker at some point in his life but right now he does not appear to be that symptomatic from this.  6. Essential hypertension -We are holding blood pressure medications at this time.  Blood pressure is stable acceptable today.  Disposition: Return in about 3 months (around 04/25/2020).  Medication Adjustments/Labs and Tests Ordered: Current medicines are reviewed at length with the patient today.  Concerns regarding medicines are outlined above.  Orders Placed This Encounter  Procedures  . EXERCISE TOLERANCE TEST (ETT)  . EKG 12-Lead   No orders of the defined types were placed in this encounter.   Patient Instructions  Medication Instructions:  The current medical regimen is effective;  continue present plan and  medications.  *If you need a refill on your cardiac medications before your next appointment, please call your pharmacy*   Lab Work: COVID TEST needed before.  If you have labs (blood work) drawn today and your tests are completely normal, you will receive your results only by: Marland Kitchen MyChart Message (if you have MyChart) OR . A paper copy in the mail If you have any lab test that is abnormal or we need to change your treatment, we will call you to review the results.   Testing/Procedures: Your physician has requested that you have an exercise tolerance test. For further information please visit [email or web address]. Please also follow instruction sheet, as given.   Follow-Up: At [facility], you and your health needs are our priority.  As part of our continuing mission to provide you with exceptional heart care, we have created designated Provider Care Teams.  These Care Teams include your primary Cardiologist (physician) and Advanced Practice Providers (APPs -  Physician Assistants and Nurse Practitioners) who all work together to provide you with the care you need, when you need it.  We recommend signing up for the patient portal called "MyChart".  Sign up information is provided on this After Visit Summary.  MyChart is used to connect with patients for Virtual Visits (Telemedicine).  Patients are able to view lab/test results, encounter notes, upcoming appointments, etc.  Non-urgent messages can be sent to your provider as well.   To learn more about what you can do with MyChart, go to [email or web address].    Your next appointment:   3 month(s)  The format for your next appointment:   In Person  Provider:   Eleonore Chiquito, MD       Time Spent with Patient: I have spent a total of 35 minutes with patient reviewing hospital notes, telemetry, EKGs, labs and examining the patient as well as establishing an assessment and plan that was discussed with the patient.  > 50% of time  was spent in direct patient care.  Signed, Addison Naegeli. Audie Box, [place]  [address] [place], [place] [address] [phone]  01/24/2020 12:36 PM

## 2020-01-24 ENCOUNTER — Ambulatory Visit: Payer: Medicare Other | Admitting: Cardiovascular Disease

## 2020-01-24 ENCOUNTER — Encounter: Payer: Self-pay | Admitting: Cardiovascular Disease

## 2020-01-24 ENCOUNTER — Other Ambulatory Visit: Payer: Self-pay

## 2020-01-24 VITALS — BP 126/60 | HR 51 | Ht 76.0 in | Wt 225.0 lb

## 2020-01-24 DIAGNOSIS — R001 Bradycardia, unspecified: Secondary | ICD-10-CM | POA: Diagnosis not present

## 2020-01-24 DIAGNOSIS — R42 Dizziness and giddiness: Secondary | ICD-10-CM

## 2020-01-24 DIAGNOSIS — I5032 Chronic diastolic (congestive) heart failure: Secondary | ICD-10-CM

## 2020-01-24 DIAGNOSIS — I10 Essential (primary) hypertension: Secondary | ICD-10-CM

## 2020-01-24 DIAGNOSIS — R0602 Shortness of breath: Secondary | ICD-10-CM | POA: Diagnosis not present

## 2020-01-24 DIAGNOSIS — I453 Trifascicular block: Secondary | ICD-10-CM

## 2020-01-24 NOTE — Patient Instructions (Signed)
Medication Instructions:  The current medical regimen is effective;  continue present plan and medications.  *If you need a refill on your cardiac medications before your next appointment, please call your pharmacy*   Lab Work: COVID TEST needed before.  If you have labs (blood work) drawn today and your tests are completely normal, you will receive your results only by: Marland Kitchen MyChart Message (if you have MyChart) OR . A paper copy in the mail If you have any lab test that is abnormal or we need to change your treatment, we will call you to review the results.   Testing/Procedures: Your physician has requested that you have an exercise tolerance test. For further information please visit [email or web address]. Please also follow instruction sheet, as given.   Follow-Up: At [facility], you and your health needs are our priority.  As part of our continuing mission to provide you with exceptional heart care, we have created designated Provider Care Teams.  These Care Teams include your primary Cardiologist (physician) and Advanced Practice Providers (APPs -  Physician Assistants and Nurse Practitioners) who all work together to provide you with the care you need, when you need it.  We recommend signing up for the patient portal called "MyChart".  Sign up information is provided on this After Visit Summary.  MyChart is used to connect with patients for Virtual Visits (Telemedicine).  Patients are able to view lab/test results, encounter notes, upcoming appointments, etc.  Non-urgent messages can be sent to your provider as well.   To learn more about what you can do with MyChart, go to [email or web address].    Your next appointment:   3 month(s)  The format for your next appointment:   In Person  Provider:   Eleonore Chiquito, MD

## 2020-01-31 ENCOUNTER — Telehealth (HOSPITAL_COMMUNITY): Payer: Self-pay

## 2020-01-31 NOTE — Telephone Encounter (Signed)
Encounter complete. 

## 2020-02-02 ENCOUNTER — Other Ambulatory Visit (HOSPITAL_COMMUNITY)
Admission: RE | Admit: 2020-02-02 | Discharge: 2020-02-02 | Disposition: A | Discharge status: Home / Self Care | Payer: Medicare Other | Source: Ambulatory Visit | Attending: Cardiology | Admitting: Cardiology

## 2020-02-02 DIAGNOSIS — Z20822 Contact with and (suspected) exposure to covid-19: Secondary | ICD-10-CM | POA: Insufficient documentation

## 2020-02-02 DIAGNOSIS — Z01812 Encounter for preprocedural laboratory examination: Secondary | ICD-10-CM | POA: Insufficient documentation

## 2020-02-02 LAB — SARS CORONAVIRUS 2 (TAT 6-24 HRS): SARS Coronavirus 2: NEGATIVE

## 2020-02-06 ENCOUNTER — Ambulatory Visit (HOSPITAL_COMMUNITY)
Admission: RE | Admit: 2020-02-06 | Discharge: 2020-02-06 | Disposition: A | Discharge status: Home / Self Care | Payer: Medicare Other | Source: Ambulatory Visit | Attending: Cardiology | Admitting: Cardiology

## 2020-02-06 ENCOUNTER — Other Ambulatory Visit: Payer: Self-pay

## 2020-02-06 DIAGNOSIS — R001 Bradycardia, unspecified: Secondary | ICD-10-CM | POA: Diagnosis not present

## 2020-02-06 LAB — EXERCISE TOLERANCE TEST
Estimated workload: 4.6 METS
Exercise duration (min): 2 min
Exercise duration (sec): 45 s
MPHR: 135 {beats}/min
Peak HR: 110 {beats}/min
Percent HR: 81 %
Rest HR: 45 {beats}/min

## 2020-03-30 NOTE — Progress Notes (Signed)
Cardiology Office Note:   Date:  04/02/2020  NAME:  [name] [name]    MRN: [id number] DOB:  December 18, 1931   PCP:  Kristie Cowman, MD  Cardiologist:  Evalina Field, MD   Referring MD: Kristie Cowman, MD   Chief Complaint  Patient presents with  . Shortness of Breath   History of Present Illness:   [name] [name] is a 86 y.o. male with a hx of trifascicular block, HFpEF, CKD, DM, HTN who presents for follow-up.  He reports he is doing well since her last visit.  He reports his shortness of breath has improved.  He is not dizzy or lightheaded when he does activities.  He is not exercising routinely but does do activity around the house without any major limitation.  He is wearing compression stockings and symptoms seem to be better.  I suspect he did have a little bit of orthostasis but he does have significant conduction disease.  His blood pressure is 152/86.  He was restarted on lisinopril 5 mg by his primary care physician.  This is okay.  I would not get too aggressive even his age.  He should avoid beta-blockers or AV nodal agents.  I did discuss this with he and his wife.  His wife is a retired Marine scientist that she does know what is important here.  He denies chest pain, shortness of breath or palpitations today.  Heart rate is in the 50s.  Problem List 1. HFpEF, EF 55-60%, elevated BNP -Normal MPI 11/2019 -ETT with increase in HR to 81% MPHR 2.  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 HTN 4. CKD 3 5. Diabetes  Past Medical History: Past Medical History:  Diagnosis Date  . Hypertension   . Hypothyroidism   . Prostate CA ([place])   . Prostate cancer [facility])     Past Surgical History: Past Surgical History:  Procedure Laterality Date  . CHOLECYSTECTOMY    . HIP SURGERY      Current Medications: Current Meds  Medication Sig  . acetaminophen (TYLENOL) 500 MG tablet Take 500 mg by mouth every 6 (six) hours as  needed.  Marland Kitchen aspirin 81 MG chewable tablet Chew 81 mg by mouth daily.  . Cholecalciferol (VITAMIN D3) 25 MCG (1000 UT) CAPS Take 1,000 Units by mouth daily.  Marland Kitchen esomeprazole (NEXIUM) 40 MG capsule Take 40 mg by mouth daily.  . ferrous gluconate (FERGON) 324 MG tablet Take 648 mg by mouth daily.  Marland Kitchen levothyroxine (SYNTHROID) 137 MCG tablet Take 137 mcg by mouth daily.  Marland Kitchen lisinopril (ZESTRIL) 5 MG tablet Take 5 mg by mouth daily.  . meloxicam (MOBIC) 15 MG tablet Take 15 mg by mouth daily.  . tamsulosin (FLOMAX) 0.4 MG CAPS capsule Take 0.4 mg by mouth daily.  . traMADol (ULTRAM) 50 MG tablet Take 50 mg by mouth every 4 (four) hours as needed.  . Vitamin D, Ergocalciferol, (DRISDOL) 1.25 MG (50000 UT) CAPS capsule Take 50,000 Units by mouth once a week.     Allergies:    Patient has no known allergies.   Social History: Social History   Socioeconomic History  . Marital status: Married    Spouse name: Not on file  . Number of children: 3  . Years of education: Not on file  . Highest education level: Not on file  Occupational History  . Occupation: retired   Tobacco Use  . Smoking status: Former Smoker  Years: 30.00  . Smokeless tobacco: Never Used  Substance and Sexual Activity  . Alcohol use: Not Currently  . Drug use: Not on file  . Sexual activity: Not on file  Other Topics Concern  . Not on file  Social History Narrative  . Not on file   Social Determinants of Health   Financial Resource Strain:   . Difficulty of Paying Living Expenses:   Food Insecurity:   . Worried About Charity fundraiser in the Last Year:   . Arboriculturist in the Last Year:   Transportation Needs:   . Film/video editor (Medical):   Marland Kitchen Lack of Transportation (Non-Medical):   Physical Activity:   . Days of Exercise per Week:   . Minutes of Exercise per Session:   Stress:   . Feeling of Stress :   Social Connections:   . Frequency of Communication with Friends and Family:   . Frequency  of Social Gatherings with Friends and Family:   . Attends Religious Services:   . Active Member of Clubs or Organizations:   . Attends Archivist Meetings:   Marland Kitchen Marital Status:      Family History: The patient's family history includes Cancer in his mother; Heart disease in his mother; Hypertension in his father; Prostate cancer in his brother.  ROS:   All other ROS reviewed and negative. Pertinent positives noted in the HPI.     EKGs/Labs/Other Studies Reviewed:   The following studies were personally reviewed by me today:   TTE 11/15/2019 1. Left ventricular ejection fraction, by visual estimation, is 55 to  60%. The left ventricle has normal function. There is mildly increased  left ventricular hypertrophy.  2. Left ventricular diastolic parameters are indeterminate.  3. The left ventricle has no regional wall motion abnormalities.  4. Global right ventricle has normal systolic function.The right  ventricular size is normal. No increase in right ventricular wall  thickness.  5. Left atrial size was normal.  6. Right atrial size was normal.  7. The mitral valve is normal in structure. Trivial mitral valve  regurgitation.  8. The tricuspid valve is normal in structure.  9. The aortic valve is tricuspid. Aortic valve regurgitation is not  visualized. Mild aortic valve sclerosis without stenosis.  10. Pulmonic regurgitation is mild.  11. The pulmonic valve was normal in structure. Pulmonic valve  regurgitation is mild.  12. Normal pulmonary artery systolic pressure.  13. The inferior vena cava is dilated in size with >50% respiratory  variability, suggesting right atrial pressure of 8 mmHg.   NM Stress 11/07/2019  Nuclear stress EF: 48%. Visually the ejection fraction appears to be greater than the 48% calculated by the computer and appears to be normal.  There was no ST segment deviation noted during stress.  This is a low risk study. There is no evidence  of ischemia and no evidence of previous infarction.  The study is normal.     ETT 02/06/2020  Poor exercise capacity (4.6 METS)  Failed to meet target HR. HR increased with exercise from 45bpm to 110bpm (81% max age predicted HR)  Upsloping ST segment depression was noted during stress, appears horizontal in V3. No clear evidence of ischemia, though study was non-diagnostic due to failure to meet target HR and significant artifact during stress  Recent Labs: 11/01/2019: BNP 394.3; BUN 19; Creatinine, Ser 1.28; Hemoglobin 10.0; Platelets 212; Potassium 4.4; Sodium 139; TSH 0.692   Recent Lipid Panel  Component Value Date/Time   CHOL 157 08/25/2009 0000   TRIG 107.0 08/25/2009 0000   HDL 48.00 08/25/2009 0000   CHOLHDL 3 08/25/2009 0000   VLDL 21.4 08/25/2009 0000   LDLCALC 88 08/25/2009 0000    Physical Exam:   VS:  BP (!) 152/86   Pulse (!) 50   Temp (!) 96.8 F (36 C)   Ht 6\' 4"  (1.93 m)   Wt 227 lb 3.2 oz (103.1 kg)   SpO2 97%   BMI 27.66 kg/m    Wt Readings from Last 3 Encounters:  04/02/20 227 lb 3.2 oz (103.1 kg)  01/24/20 225 lb (102.1 kg)  11/23/19 227 lb 4.8 oz (103.1 kg)    General: Well nourished, well developed, in no acute distress Heart: Atraumatic, normal size  Eyes: PEERLA, EOMI  Neck: Supple, no JVD Endocrine: No thryomegaly Cardiac: Normal S1, S2; RRR; no murmurs, rubs, or gallops Lungs: Clear to auscultation bilaterally, no wheezing, rhonchi or rales  Abd: Soft, nontender, no hepatomegaly  Ext: No edema, pulses 2+ Musculoskeletal: No deformities, BUE and BLE strength normal and equal Skin: Warm and dry, no rashes   Neuro: Alert and oriented to person, place, time, and situation, CNII-XII grossly intact, no focal deficits  Psych: Normal mood and affect   ASSESSMENT:   [name] [name] is a 86 y.o. male who presents for the following: 1. SOB (shortness of breath)   2. Dizziness   3. Trifascicular block   4. Chronic diastolic heart failure  ([place])   5. Essential hypertension     PLAN:   1. SOB (shortness of breath) 2. Dizziness -Suspect symptoms are related to orthostasis.  We did back off on blood pressure medications and symptoms improved.  He had a negative nuclear medicine stress test.  His echocardiogram showed normal left ventricular function.  He did have an elevated BNP however symptoms improved without Lasix.  He seems to be doing well.  For now we'll continue to monitor his symptoms.  See trifascicular block below.  3. Trifascicular block -EKG consistent with trifascicular block.  Echocardiogram normal left ventricular function.  Negative nuclear medicine stress test.  We did pursue an exercise treadmill stress test and he had no evidence of chronotropic incompetence.  He was able to achieve 81% of age-predicted maximal heart rate.  Less than 80% is a diagnosis of chronotropic incompetence.  He is not have this.  We'll continue to monitor symptoms for now.  He should avoid AV nodal agents moving forward.  This would include any beta-blocker or calcium channel blocker.  4. Chronic diastolic heart failure (HCC) -Elevated BNP.  Relatively asymptomatic.  He has no increase lower extremity edema.  Symptoms appear to be nonexistent.  We will monitor this for now.  5. Essential hypertension -Blood pressure bit elevated today.  Given his age we may just want to monitor this.  Would be cautious with aggressive blood pressure control.  Disposition: Return in about 6 months (around 10/02/2020).  Medication Adjustments/Labs and Tests Ordered: Current medicines are reviewed at length with the patient today.  Concerns regarding medicines are outlined above.  No orders of the defined types were placed in this encounter.  No orders of the defined types were placed in this encounter.   Patient Instructions  Medication Instructions:  The current medical regimen is effective;  continue present plan and medications.  *If you need a  refill on your cardiac medications before your next appointment, please call your pharmacy*  Follow-Up: At [facility], you and your health needs are our priority.  As part of our continuing mission to provide you with exceptional heart care, we have created designated Provider Care Teams.  These Care Teams include your primary Cardiologist (physician) and Advanced Practice Providers (APPs -  Physician Assistants and Nurse Practitioners) who all work together to provide you with the care you need, when you need it.  We recommend signing up for the patient portal called "MyChart".  Sign up information is provided on this After Visit Summary.  MyChart is used to connect with patients for Virtual Visits (Telemedicine).  Patients are able to view lab/test results, encounter notes, upcoming appointments, etc.  Non-urgent messages can be sent to your provider as well.   To learn more about what you can do with MyChart, go to [email or web address].    Your next appointment:   6 month(s)  The format for your next appointment:   In Person  Provider:   Eleonore Chiquito, MD      Time Spent with Patient: I have spent a total of 25 minutes with patient reviewing hospital notes, telemetry, EKGs, labs and examining the patient as well as establishing an assessment and plan that was discussed with the patient.  > 50% of time was spent in direct patient care.  Signed, Addison Naegeli. Audie Box, [place]  [address] [place], [place] [address] [phone]  04/02/2020 11:47 AM

## 2020-04-02 ENCOUNTER — Other Ambulatory Visit: Payer: Self-pay

## 2020-04-02 ENCOUNTER — Encounter: Payer: Self-pay | Admitting: Cardiovascular Disease

## 2020-04-02 ENCOUNTER — Ambulatory Visit (INDEPENDENT_AMBULATORY_CARE_PROVIDER_SITE_OTHER): Payer: Medicare Other | Admitting: Cardiovascular Disease

## 2020-04-02 VITALS — BP 152/86 | HR 50 | Temp 96.8°F | Ht 76.0 in | Wt 227.2 lb

## 2020-04-02 DIAGNOSIS — I10 Essential (primary) hypertension: Secondary | ICD-10-CM

## 2020-04-02 DIAGNOSIS — I5032 Chronic diastolic (congestive) heart failure: Secondary | ICD-10-CM

## 2020-04-02 DIAGNOSIS — R0602 Shortness of breath: Secondary | ICD-10-CM

## 2020-04-02 DIAGNOSIS — I453 Trifascicular block: Secondary | ICD-10-CM | POA: Diagnosis not present

## 2020-04-02 DIAGNOSIS — R42 Dizziness and giddiness: Secondary | ICD-10-CM | POA: Diagnosis not present

## 2020-04-02 NOTE — Patient Instructions (Signed)
Medication Instructions:  The current medical regimen is effective;  continue present plan and medications.  *If you need a refill on your cardiac medications before your next appointment, please call your pharmacy*   Follow-Up: At [facility], you and your health needs are our priority.  As part of our continuing mission to provide you with exceptional heart care, we have created designated Provider Care Teams.  These Care Teams include your primary Cardiologist (physician) and Advanced Practice Providers (APPs -  Physician Assistants and Nurse Practitioners) who all work together to provide you with the care you need, when you need it.  We recommend signing up for the patient portal called "MyChart".  Sign up information is provided on this After Visit Summary.  MyChart is used to connect with patients for Virtual Visits (Telemedicine).  Patients are able to view lab/test results, encounter notes, upcoming appointments, etc.  Non-urgent messages can be sent to your provider as well.   To learn more about what you can do with MyChart, go to [email or web address].    Your next appointment:   6 month(s)  The format for your next appointment:   In Person  Provider:   [place] O'Neal, MD      

## 2020-09-28 NOTE — Progress Notes (Signed)
Cardiology Office Note:   Date:  09/30/2020  NAME:  [name] [name]    MRN: [id number] DOB:  11/03/1931   PCP:  Kristie Cowman, MD  Cardiologist:  Evalina Field, MD  Electrophysiologist:  None   Referring MD: Kristie Cowman, MD   Chief Complaint  Patient presents with  . Follow-up    History of Present Illness:   [name] [name] is a 86 y.o. male with a hx of trifascicular block, HFpEF, CKD, DM, HTN who presents for follow-up. At last visit we backed off on aggressive BP control and he had improvement in symptoms. Echo with preserved EF. Trifascicular block on EKG but ETT showed no chronotropic incompetence. He also completed a monitor without high grade conduction disease. His blood pressure is a bit elevated 164/80.  Do not check it at home.  Despite this he reports no symptoms of chest pain or shortness of breath.  He has had no further episodes of dizziness.  He takes lisinopril 10 mg daily.  He apparently is feeling better off of several blood pressure medications.  Most of his symptoms were attributed to orthostasis.  His weights are down to 221 pounds.  He is euvolemic on examination.  Overall doing well.  He denies any dizziness.  No rapid heartbeat sensation.  No syncope reported.  I did counsel him on symptoms to look out for given his trifascicular block which include shortness of breath, dizziness and lightheadedness.  He is having none of the symptoms.  Overall doing quite well.  He does report symptoms of weight loss and worsening frequent urination.  I did inform him that it would be a good idea to have his thyroid retested by his primary care physician.  He does take Synthroid.  He also likely will need to have his prostate reevaluated.  Problem List 1. HFpEF, EF 55-60%, elevated BNP -Normal MPI 11/2019 2.Trifascicular block -First-degree AV block, 240 ms, right bundle branch block, left anterior fascicular block -ETT with increase in HR to 81% MPHR 3. HTN 4. CKD 3 5.  Diabetes  Past Medical History: Past Medical History:  Diagnosis Date  . Hypertension   . Hypothyroidism   . Prostate CA ([place])   . Prostate cancer [facility])     Past Surgical History: Past Surgical History:  Procedure Laterality Date  . CHOLECYSTECTOMY    . HIP SURGERY      Current Medications: Current Meds  Medication Sig  . acetaminophen (TYLENOL) 500 MG tablet Take 500 mg by mouth every 6 (six) hours as needed.  Marland Kitchen aspirin 81 MG chewable tablet Chew 81 mg by mouth daily.  . Cholecalciferol (VITAMIN D3) 25 MCG (1000 UT) CAPS Take 1,000 Units by mouth daily.  Marland Kitchen esomeprazole (NEXIUM) 40 MG capsule Take 40 mg by mouth daily.  . ferrous gluconate (FERGON) 324 MG tablet Take 648 mg by mouth daily.  Marland Kitchen levothyroxine (SYNTHROID) 137 MCG tablet Take 137 mcg by mouth daily.  Marland Kitchen lisinopril (ZESTRIL) 5 MG tablet Take 10 mg by mouth daily.   . meloxicam (MOBIC) 15 MG tablet Take 15 mg by mouth daily.  . tamsulosin (FLOMAX) 0.4 MG CAPS capsule Take 0.4 mg by mouth daily.  . traMADol (ULTRAM) 50 MG tablet Take 50 mg by mouth every 4 (four) hours as needed.  . Vitamin D, Ergocalciferol, (DRISDOL) 1.25 MG (50000 UT) CAPS capsule Take 50,000 Units by mouth once a week.     Allergies:    Patient has no known allergies.  Social History: Social History   Socioeconomic History  . Marital status: Married    Spouse name: Not on file  . Number of children: 3  . Years of education: Not on file  . Highest education level: Not on file  Occupational History  . Occupation: retired   Tobacco Use  . Smoking status: Former Smoker    Years: 30.00  . Smokeless tobacco: Never Used  Substance and Sexual Activity  . Alcohol use: Not Currently  . Drug use: Not on file  . Sexual activity: Not on file  Other Topics Concern  . Not on file  Social History Narrative  . Not on file   Social Determinants of Health   Financial Resource Strain:   . Difficulty of Paying Living Expenses: Not on file    Food Insecurity:   . Worried About Charity fundraiser in the Last Year: Not on file  . Ran Out of Food in the Last Year: Not on file  Transportation Needs:   . Lack of Transportation (Medical): Not on file  . Lack of Transportation (Non-Medical): Not on file  Physical Activity:   . Days of Exercise per Week: Not on file  . Minutes of Exercise per Session: Not on file  Stress:   . Feeling of Stress : Not on file  Social Connections:   . Frequency of Communication with Friends and Family: Not on file  . Frequency of Social Gatherings with Friends and Family: Not on file  . Attends Religious Services: Not on file  . Active Member of Clubs or Organizations: Not on file  . Attends Archivist Meetings: Not on file  . Marital Status: Not on file     Family History: The patient's family history includes Cancer in his mother; Heart disease in his mother; Hypertension in his father; Prostate cancer in his brother.  ROS:   All other ROS reviewed and negative. Pertinent positives noted in the HPI.     EKGs/Labs/Other Studies Reviewed:   The following studies were personally reviewed by me today:  EKG:  EKG is ordered today.  The ekg ordered today demonstrates Sinus bradycardia, heart rate 45, first-degree AV block 270 ms, right bundle branch block, left anterior fascicular block, and was personally reviewed by me.   TTE 11/15/2019 1. Left ventricular ejection fraction, by visual estimation, is 55 to  60%. The left ventricle has normal function. There is mildly increased  left ventricular hypertrophy.  2. Left ventricular diastolic parameters are indeterminate.  3. The left ventricle has no regional wall motion abnormalities.  4. Global right ventricle has normal systolic function.The right  ventricular size is normal. No increase in right ventricular wall  thickness.  5. Left atrial size was normal.  6. Right atrial size was normal.  7. The mitral valve is normal in  structure. Trivial mitral valve  regurgitation.  8. The tricuspid valve is normal in structure.  9. The aortic valve is tricuspid. Aortic valve regurgitation is not  visualized. Mild aortic valve sclerosis without stenosis.  10. Pulmonic regurgitation is mild.  11. The pulmonic valve was normal in structure. Pulmonic valve  regurgitation is mild.  12. Normal pulmonary artery systolic pressure.  13. The inferior vena cava is dilated in size with >50% respiratory  variability, suggesting right atrial pressure of 8 mmHg.   [place] 12/12/2019 1. Sinus bradycardia with intermittent Wenckebach.  2. Baseline 1AVB noted. 3. 9 sinus pauses in setting of Wenckebach up to 3.4  seconds. No symptoms reported.  4. No atrial fibrillation.    ETT 02/06/2020  Poor exercise capacity (4.6 METS)  Failed to meet target HR. HR increased with exercise from 45bpm to 110bpm (81% max age predicted HR)  Upsloping ST segment depression was noted during stress, appears horizontal in V3. No clear evidence of ischemia, though study was non-diagnostic due to failure to meet target HR and significant artifact during stress    Recent Labs: 11/01/2019: BNP 394.3; BUN 19; Creatinine, Ser 1.28; Hemoglobin 10.0; Platelets 212; Potassium 4.4; Sodium 139; TSH 0.692   Recent Lipid Panel    Component Value Date/Time   CHOL 157 08/25/2009 0000   TRIG 107.0 08/25/2009 0000   HDL 48.00 08/25/2009 0000   CHOLHDL 3 08/25/2009 0000   VLDL 21.4 08/25/2009 0000   LDLCALC 88 08/25/2009 0000    Physical Exam:   VS:  BP (!) 164/80   Pulse (!) 45   Ht 6\' 4"  (1.93 m)   Wt 221 lb (100.2 kg)   SpO2 99%   BMI 26.90 kg/m    Wt Readings from Last 3 Encounters:  09/30/20 221 lb (100.2 kg)  04/02/20 227 lb 3.2 oz (103.1 kg)  01/24/20 225 lb (102.1 kg)    General: Well nourished, well developed, in no acute distress Heart: Atraumatic, normal size  Eyes: PEERLA, EOMI  Neck: Supple, no JVD Endocrine: No thryomegaly Cardiac:  Normal S1, S2; RRR; no murmurs, rubs, or gallops Lungs: Clear to auscultation bilaterally, no wheezing, rhonchi or rales  Abd: Soft, nontender, no hepatomegaly  Ext: No edema, pulses 2+ Musculoskeletal: No deformities, BUE and BLE strength normal and equal Skin: Warm and dry, no rashes   Neuro: Alert and oriented to person, place, time, and situation, CNII-XII grossly intact, no focal deficits  Psych: Normal mood and affect   ASSESSMENT:   [name] [name] is a 86 y.o. male who presents for the following: 1. SOB (shortness of breath)   2. Dizziness   3. Sinus bradycardia   4. Trifascicular block   5. Chronic diastolic heart failure ([place])   6. Essential hypertension     PLAN:   1. SOB (shortness of breath) 2. Dizziness 3. Sinus bradycardia 4. Trifascicular block 5. Chronic diastolic heart failure ([place]) 6. Essential hypertension -Symptoms of dizziness and shortness of breath have improved.  He had a negative nuclear medicine stress test for ischemia.  Echocardiogram shows normal LV function.  EKG with trifascicular block.  He did complete an exercise tolerance test without evidence of chronotropic incompetence.  His symptoms have improved with backing off blood pressure medications.  Overall, I feel he was overmedicated and likely had some symptoms of orthostasis.  Symptoms mainly occurred with change in position.  He is not active but has no further symptoms.  Blood pressure goal likely between 140 to 150 mmHg is acceptable.  Given his age aggressive goals may cause him to get dizzy.  He is euvolemic on examination despite having elevated BNP.  He is working on his diet and watching his salt intake.  He is not requiring any diuretics.  He will continue conservative management moving forward.  I did counsel him on symptoms to look out for including worsening shortness of breath, dizziness or lightheadedness.  This would indicate that he may have worsening conduction disease.  He also should  avoid any AV nodal agents moving forward.  He was in agreement with this plan.  We will keep an eye on him.  I  will see him back in 6 months  Disposition: Return in about 6 months (around 03/30/2021).  Medication Adjustments/Labs and Tests Ordered: Current medicines are reviewed at length with the patient today.  Concerns regarding medicines are outlined above.  No orders of the defined types were placed in this encounter.  No orders of the defined types were placed in this encounter.   Patient Instructions  Medication Instructions:  Continue same medications *If you need a refill on your cardiac medications before your next appointment, please call your pharmacy*   Lab Work: None ordered    Testing/Procedures: None ordered   Follow-Up: At [facility], you and your health needs are our priority.  As part of our continuing mission to provide you with exceptional heart care, we have created designated Provider Care Teams.  These Care Teams include your primary Cardiologist (physician) and Advanced Practice Providers (APPs -  Physician Assistants and Nurse Practitioners) who all work together to provide you with the care you need, when you need it.  We recommend signing up for the patient portal called "MyChart".  Sign up information is provided on this After Visit Summary.  MyChart is used to connect with patients for Virtual Visits (Telemedicine).  Patients are able to view lab/test results, encounter notes, upcoming appointments, etc.  Non-urgent messages can be sent to your provider as well.   To learn more about what you can do with MyChart, go to [email or web address].    Your next appointment:  6 months  Call in Feb to schedule May appointment   The format for your next appointment: Office   Provider:  Dr.O'Neal   Have your Primary Care Dr check your thyroid and prostate     Time Spent with Patient: I have spent a total of 25 minutes with patient reviewing  hospital notes, telemetry, EKGs, labs and examining the patient as well as establishing an assessment and plan that was discussed with the patient.  > 50% of time was spent in direct patient care.  Signed, Addison Naegeli. Audie Box, [place]  [address], [place], [place] [address] [phone]  09/30/2020 9:58 AM

## 2020-09-30 ENCOUNTER — Ambulatory Visit: Payer: Medicare Other | Admitting: Cardiovascular Disease

## 2020-09-30 ENCOUNTER — Encounter: Payer: Self-pay | Admitting: Cardiovascular Disease

## 2020-09-30 ENCOUNTER — Other Ambulatory Visit: Payer: Self-pay

## 2020-09-30 VITALS — BP 164/80 | HR 45 | Ht 76.0 in | Wt 221.0 lb

## 2020-09-30 DIAGNOSIS — R0602 Shortness of breath: Secondary | ICD-10-CM

## 2020-09-30 DIAGNOSIS — I453 Trifascicular block: Secondary | ICD-10-CM | POA: Diagnosis not present

## 2020-09-30 DIAGNOSIS — I10 Essential (primary) hypertension: Secondary | ICD-10-CM

## 2020-09-30 DIAGNOSIS — R001 Bradycardia, unspecified: Secondary | ICD-10-CM

## 2020-09-30 DIAGNOSIS — R42 Dizziness and giddiness: Secondary | ICD-10-CM

## 2020-09-30 DIAGNOSIS — I5032 Chronic diastolic (congestive) heart failure: Secondary | ICD-10-CM

## 2020-09-30 NOTE — Patient Instructions (Signed)
Medication Instructions:  Continue same medications *If you need a refill on your cardiac medications before your next appointment, please call your pharmacy*   Lab Work: None ordered    Testing/Procedures: None ordered   Follow-Up: At [facility], you and your health needs are our priority.  As part of our continuing mission to provide you with exceptional heart care, we have created designated Provider Care Teams.  These Care Teams include your primary Cardiologist (physician) and Advanced Practice Providers (APPs -  Physician Assistants and Nurse Practitioners) who all work together to provide you with the care you need, when you need it.  We recommend signing up for the patient portal called "MyChart".  Sign up information is provided on this After Visit Summary.  MyChart is used to connect with patients for Virtual Visits (Telemedicine).  Patients are able to view lab/test results, encounter notes, upcoming appointments, etc.  Non-urgent messages can be sent to your provider as well.   To learn more about what you can do with MyChart, go to [email or web address].    Your next appointment:  6 months  Call in Feb to schedule May appointment   The format for your next appointment: Office   Provider:  Dr.O'Neal   Have your Primary Care Dr check your thyroid and prostate

## 2020-11-30 NOTE — Progress Notes (Signed)
Cardiology Office Note:   Date:  12/02/2020  NAME:  [name] [name]    MRN: [id number] DOB:  [birth year]   PCP:  Kristie Cowman, MD  Cardiologist:  Evalina Field, MD   Referring MD: Kristie Cowman, MD   Chief Complaint  Patient presents with  . Follow-up    6 months.   . Shortness of Breath  . Edema    Feet and ankles.    History of Present Illness:   [name] [name] is a 87 y.o. male with a hx of HFpEF, CKD, DM, trifascular block, HTN who presents for follow-up.  He reports for the last 3 to 4 weeks has had increasing shortness of breath with exertion.  Also reports he can get dizzy.  He does have dark stools but he takes iron supplementation.  His hemoglobin last year was 10.  Has not been rechecked.  He also reports some increased lower extremity edema.  Weights are down to 219 pounds.  Weights at our last visit were 221 pounds.  He is not gaining fluid.  He is not that active.  He presents with his wife.  His wife also reports that he is stressed out.  His brother is sick and is also 63 years of age.  He also has 2 children who have nothing to do with him.  This weighs on him.  His EKG in office demonstrates sinus bradycardia with trifascicular block.  He is not had any syncopal episodes.  Blood pressure 136/64 in office.  He denies any chest pain or chest pressure.  Symptoms are related to shortness of breath and dizziness.  He had extensive work-up last year and had no evidence of chronotropic incompetence.  I think he needs reevaluation.  Problem List 1. 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 HTN 4. CKD 3 5. Diabetes  Past Medical History: Past Medical History:  Diagnosis Date  . Hypertension   . Hypothyroidism   . Prostate CA ([name] [name])   . Prostate cancer [facility])     Past Surgical History: Past Surgical  History:  Procedure Laterality Date  . CHOLECYSTECTOMY    . HIP SURGERY      Current Medications: Current Meds  Medication Sig  . acetaminophen (TYLENOL) 500 MG tablet Take 500 mg by mouth every 6 (six) hours as needed.  Marland Kitchen aspirin 81 MG chewable tablet Chew 81 mg by mouth daily.  . Cholecalciferol (VITAMIN D3) 25 MCG (1000 UT) CAPS Take 1,000 Units by mouth daily.  Marland Kitchen esomeprazole (NEXIUM) 40 MG capsule Take 40 mg by mouth daily.  . ferrous gluconate (FERGON) 324 MG tablet Take 648 mg by mouth daily.  Marland Kitchen levothyroxine (SYNTHROID) 137 MCG tablet Take 137 mcg by mouth daily.  Marland Kitchen lisinopril (ZESTRIL) 5 MG tablet Take 10 mg by mouth daily.   . meloxicam (MOBIC) 15 MG tablet Take 15 mg by mouth daily.  . tamsulosin (FLOMAX) 0.4 MG CAPS capsule Take 0.4 mg by mouth daily.  . traMADol (ULTRAM) 50 MG tablet Take 50 mg by mouth every 4 (four) hours as needed.  . Vitamin D, Ergocalciferol, (DRISDOL) 1.25 MG (50000 UT) CAPS capsule Take 50,000 Units by mouth once a week.     Allergies:    Patient has no known allergies.   Social History: Social History   Socioeconomic History  . Marital  status: Married    Spouse name: Not on file  . Number of children: 3  . Years of education: Not on file  . Highest education level: Not on file  Occupational History  . Occupation: retired   Tobacco Use  . Smoking status: Former Smoker    Years: 30.00  . Smokeless tobacco: Never Used  Substance and Sexual Activity  . Alcohol use: Not Currently  . Drug use: Not on file  . Sexual activity: Not on file  Other Topics Concern  . Not on file  Social History Narrative  . Not on file   Social Determinants of Health   Financial Resource Strain: Not on file  Food Insecurity: Not on file  Transportation Needs: Not on file  Physical Activity: Not on file  Stress: Not on file  Social Connections: Not on file     Family History: The patient's family history includes Cancer in his mother; Heart disease  in his mother; Hypertension in his father; Prostate cancer in his brother.  ROS:   All other ROS reviewed and negative. Pertinent positives noted in the HPI.     EKGs/Labs/Other Studies Reviewed:   The following studies were personally reviewed by me today:  EKG:  EKG is ordered today.  The ekg ordered today demonstrates sinus bradycardia heart rate 53, first-degree block noted 254 ms, right bundle branch block, left intrafascicular block, and was personally reviewed by me.   ETT 02/06/2020  Poor exercise capacity (4.6 METS)  Failed to meet target HR. HR increased with exercise from 45bpm to 110bpm (81% max age predicted HR)  Upsloping ST segment depression was noted during stress, appears horizontal in V3. No clear evidence of ischemia, though study was non-diagnostic due to failure to meet target HR and significant artifact during stress  Zio 12/12/2019  1. Sinus bradycardia with intermittent Wenckebach.  2. Baseline 1AVB noted. 3. 9 sinus pauses in setting of Wenckebach up to 3.4 seconds. No symptoms reported.  4. No atrial fibrillation.   NM Stress 11/07/2019  Nuclear stress EF: 48%. Visually the ejection fraction appears to be greater than the 48% calculated by the computer and appears to be normal.  There was no ST segment deviation noted during stress.  This is a low risk study. There is no evidence of ischemia and no evidence of previous infarction.  The study is normal.  TTE 11/15/2019 1. Left ventricular ejection fraction, by visual estimation, is 55 to  60%. The left ventricle has normal function. There is mildly increased  left ventricular hypertrophy.  2. Left ventricular diastolic parameters are indeterminate.  3. The left ventricle has no regional wall motion abnormalities.  4. Global right ventricle has normal systolic function.The right  ventricular size is normal. No increase in right ventricular wall  thickness.  5. Left atrial size was normal.  6.  Right atrial size was normal.  7. The mitral valve is normal in structure. Trivial mitral valve  regurgitation.  8. The tricuspid valve is normal in structure.  9. The aortic valve is tricuspid. Aortic valve regurgitation is not  visualized. Mild aortic valve sclerosis without stenosis.  10. Pulmonic regurgitation is mild.  11. The pulmonic valve was normal in structure. Pulmonic valve  regurgitation is mild.  12. Normal pulmonary artery systolic pressure.  13. The inferior vena cava is dilated in size with >50% respiratory  variability, suggesting right atrial pressure of 8 mmHg.   Recent Labs: No results found for requested labs within last  8760 hours.   Recent Lipid Panel    Component Value Date/Time   CHOL 157 08/25/2009 0000   TRIG 107.0 08/25/2009 0000   HDL 48.00 08/25/2009 0000   CHOLHDL 3 08/25/2009 0000   VLDL 21.4 08/25/2009 0000   LDLCALC 88 08/25/2009 0000    Physical Exam:   VS:  BP 136/64 (BP Location: Left Arm, Patient Position: Sitting, Cuff Size: Normal)   Pulse (!) 53   Ht 6\' 4"  (1.93 m)   Wt 219 lb (99.3 kg)   BMI 26.66 kg/m    Wt Readings from Last 3 Encounters:  12/02/20 219 lb (99.3 kg)  09/30/20 221 lb (100.2 kg)  04/02/20 227 lb 3.2 oz (103.1 kg)    General: Well nourished, well developed, in no acute distress Head: Atraumatic, normal size  Eyes: PEERLA, EOMI  Neck: Supple, no JVD Endocrine: No thryomegaly Cardiac: Normal S1, S2; RRR; no murmurs, rubs, or gallops Lungs: Clear to auscultation bilaterally, no wheezing, rhonchi or rales  Abd: Soft, nontender, no hepatomegaly  Ext: No edema, pulses 2+ Musculoskeletal: No deformities, BUE and BLE strength normal and equal Skin: Warm and dry, no rashes   Neuro: Alert and oriented to person, place, time, and situation, CNII-XII grossly intact, no focal deficits  Psych: Normal mood and affect   ASSESSMENT:   ZACKERY BRINE is a 87 y.o. male who presents for the following: 1. SOB (shortness of  breath)   2. Dizziness   3. Sinus bradycardia   4. Trifascicular block   5. Chronic diastolic heart failure ([place])   6. Essential hypertension     PLAN:   1. SOB (shortness of breath) 2. Dizziness -Weights are stable.  No evidence of heart failure examination.  He was anemic last year.  He needs a full set of labs including a CBC, BMP and TSH.  We will check a BNP but I do not think he has congestive heart failure.  His weights are actually going down.  Conduction disease could be an issue here.  EKG demonstrates sinus bradycardia heart rate 53 with trifascicular block.  He had no evidence of chronotropic incompetence on an exercise treadmill stress test last year and he had no dangerous or high-grade conduction on the heart monitor.  He had a normal stress test last year as well.  He has no symptoms concerning for angina.  I would like to proceed with a repeat heart monitor, 1 week Zio patch, to exclude any significant conduction disease.  I would also like for him to get a chest x-ray.  3. Sinus bradycardia 4. Trifascicular block -Avoid AV nodal agents.  Labs as above.  EKG stable.  Heart monitor as above.  5. Chronic diastolic heart failure ([place]) -Euvolemic on examination.  He could be developing heart failure from worsening conduction disease.  Proceed with monitor as above.  6. Essential hypertension -BP is stable.  Disposition: Return in about 3 months (around 03/01/2021).  Medication Adjustments/Labs and Tests Ordered: Current medicines are reviewed at length with the patient today.  Concerns regarding medicines are outlined above.  Orders Placed This Encounter  Procedures  . DG Chest 2 View  . CBC  . Basic metabolic panel  . Brain natriuretic peptide  . TSH  . LONG TERM MONITOR (3-14 DAYS)  . EKG 12-Lead   No orders of the defined types were placed in this encounter.   Patient Instructions  Medication Instructions:  The current medical regimen is effective;  continue  present  plan and medications.  *If you need a refill on your cardiac medications before your next appointment, please call your pharmacy*   Lab Work: CBC, BMET, BNP, TSH today   If you have labs (blood work) drawn today and your tests are completely normal, you will receive your results only by: Marland Kitchen MyChart Message (if you have MyChart) OR . A paper copy in the mail If you have any lab test that is abnormal or we need to change your treatment, we will call you to review the results.   Testing/Procedures: Chest xray - Your physician has requested that you have a chest xray, is a fast and painless imaging test that uses certain electromagnetic waves to create pictures of the structures in and around your chest. This test can help diagnose and monitor conditions such as pneumonia and other lung issues his will be done at [facility], [place]. If you should need to call them their phone number is [phone].  ZIO XT- Long Term Monitor Instructions   Your physician has requested you wear your ZIO patch monitor____7___days.   This is a single patch monitor.  [employer] supplies one patch monitor per enrollment.  Additional stickers are not available.   Please do not apply patch if you will be having a Nuclear Stress Test, Echocardiogram, Cardiac CT, MRI, or Chest Xray during the time frame you would be wearing the monitor. The patch cannot be worn during these tests.  You cannot remove and re-apply the ZIO XT patch monitor.   Your ZIO patch monitor will be sent USPS Priority mail from [facility] directly to your home address. The monitor may also be mailed to a PO BOX if home delivery is not available.   It may take 3-5 days to receive your monitor after you have been enrolled.   Once you have received you monitor, please review enclosed instructions.  Your monitor has already been registered assigning a specific monitor serial # to you.   Applying the monitor    Shave hair from upper left chest.   Hold abrader disc by orange tab.  Rub abrader in 40 strokes over left upper chest as indicated in your monitor instructions.   Clean area with 4 enclosed alcohol pads .  Use all pads to assure are is cleaned thoroughly.  Let dry.   Apply patch as indicated in monitor instructions.  Patch will be place under collarbone on left side of chest with arrow pointing upward.   Rub patch adhesive wings for 2 minutes.Remove white label marked "1".  Remove white label marked "2".  Rub patch adhesive wings for 2 additional minutes.   While looking in a mirror, press and release button in center of patch.  A small green light will flash 3-4 times .  This will be your only indicator the monitor has been turned on.     Do not shower for the first 24 hours.  You may shower after the first 24 hours.   Press button if you feel a symptom. You will hear a small click.  Record Date, Time and Symptom in the Patient Log Book.   When you are ready to remove patch, follow instructions on last 2 pages of Patient Log Book.  Stick patch monitor onto last page of Patient Log Book.   Place Patient Log Book in [place] box.  Use locking tab on box and tape box closed securely.  The [place] and [employer] has [employer]  on it.  Please place in mailbox as soon as possible.  Your physician should have your test results approximately 7 days after the monitor has been mailed back to [facility].   Call [place] at [phone] if you have questions regarding your ZIO XT patch monitor.  Call them immediately if you see an orange light blinking on your monitor.   If your monitor falls off in less than 4 days contact our Monitor department at [phone].  If your monitor becomes loose or falls off after 4 days call [employer] at [phone] for suggestions on securing your monitor.     Follow-Up: At [facility], you and your health needs are our priority.  As  part of our continuing mission to provide you with exceptional heart care, we have created designated Provider Care Teams.  These Care Teams include your primary Cardiologist (physician) and Advanced Practice Providers (APPs -  Physician Assistants and Nurse Practitioners) who all work together to provide you with the care you need, when you need it.  We recommend signing up for the patient portal called "MyChart".  Sign up information is provided on this After Visit Summary.  MyChart is used to connect with patients for Virtual Visits (Telemedicine).  Patients are able to view lab/test results, encounter notes, upcoming appointments, etc.  Non-urgent messages can be sent to your provider as well.   To learn more about what you can do with MyChart, go to [email or web address].    Your next appointment:   3 month(s)  The format for your next appointment:   In Person  Provider:   Eleonore Chiquito, MD        Time Spent with Patient: I have spent a total of 35 minutes with patient reviewing hospital notes, telemetry, EKGs, labs and examining the patient as well as establishing an assessment and plan that was discussed with the patient.  > 50% of time was spent in direct patient care.  Signed, Addison Naegeli. Audie Box, [place]  [address], [place],  [address] [phone]  12/02/2020 10:00 AM

## 2020-12-02 ENCOUNTER — Ambulatory Visit: Payer: Medicare Other

## 2020-12-02 ENCOUNTER — Encounter: Payer: Self-pay | Admitting: Radiology

## 2020-12-02 ENCOUNTER — Ambulatory Visit: Payer: Medicare Other | Admitting: Cardiovascular Disease

## 2020-12-02 ENCOUNTER — Encounter: Payer: Self-pay | Admitting: Cardiovascular Disease

## 2020-12-02 ENCOUNTER — Ambulatory Visit
Admission: RE | Admit: 2020-12-02 | Discharge: 2020-12-02 | Disposition: A | Discharge status: Home / Self Care | Payer: Medicare Other | Source: Ambulatory Visit | Attending: Cardiovascular Disease | Admitting: Cardiovascular Disease

## 2020-12-02 ENCOUNTER — Other Ambulatory Visit: Payer: Self-pay

## 2020-12-02 VITALS — BP 136/64 | HR 53 | Ht 76.0 in | Wt 219.0 lb

## 2020-12-02 DIAGNOSIS — I10 Essential (primary) hypertension: Secondary | ICD-10-CM

## 2020-12-02 DIAGNOSIS — R0602 Shortness of breath: Secondary | ICD-10-CM

## 2020-12-02 DIAGNOSIS — I453 Trifascicular block: Secondary | ICD-10-CM

## 2020-12-02 DIAGNOSIS — R001 Bradycardia, unspecified: Secondary | ICD-10-CM | POA: Diagnosis not present

## 2020-12-02 DIAGNOSIS — I5032 Chronic diastolic (congestive) heart failure: Secondary | ICD-10-CM

## 2020-12-02 DIAGNOSIS — R42 Dizziness and giddiness: Secondary | ICD-10-CM

## 2020-12-02 NOTE — Patient Instructions (Signed)
Medication Instructions:  The current medical regimen is effective;  continue present plan and medications.  *If you need a refill on your cardiac medications before your next appointment, please call your pharmacy*   Lab Work: CBC, BMET, BNP, TSH today   If you have labs (blood work) drawn today and your tests are completely normal, you will receive your results only by: Marland Kitchen MyChart Message (if you have MyChart) OR . A paper copy in the mail If you have any lab test that is abnormal or we need to change your treatment, we will call you to review the results.   Testing/Procedures: Chest xray - Your physician has requested that you have a chest xray, is a fast and painless imaging test that uses certain electromagnetic waves to create pictures of the structures in and around your chest. This test can help diagnose and monitor conditions such as pneumonia and other lung issues his will be done at [facility], [place]. If you should need to call them their phone number is [phone].  ZIO XT- Long Term Monitor Instructions   Your physician has requested you wear your ZIO patch monitor____7___days.   This is a single patch monitor.  [employer] supplies one patch monitor per enrollment.  Additional stickers are not available.   Please do not apply patch if you will be having a Nuclear Stress Test, Echocardiogram, Cardiac CT, MRI, or Chest Xray during the time frame you would be wearing the monitor. The patch cannot be worn during these tests.  You cannot remove and re-apply the ZIO XT patch monitor.   Your ZIO patch monitor will be sent USPS Priority mail from [facility] directly to your home address. The monitor may also be mailed to a PO BOX if home delivery is not available.   It may take 3-5 days to receive your monitor after you have been enrolled.   Once you have received you monitor, please review enclosed instructions.  Your monitor has already been  registered assigning a specific monitor serial # to you.   Applying the monitor   Shave hair from upper left chest.   Hold abrader disc by orange tab.  Rub abrader in 40 strokes over left upper chest as indicated in your monitor instructions.   Clean area with 4 enclosed alcohol pads .  Use all pads to assure are is cleaned thoroughly.  Let dry.   Apply patch as indicated in monitor instructions.  Patch will be place under collarbone on left side of chest with arrow pointing upward.   Rub patch adhesive wings for 2 minutes.Remove white label marked "1".  Remove white label marked "2".  Rub patch adhesive wings for 2 additional minutes.   While looking in a mirror, press and release button in center of patch.  A small green light will flash 3-4 times .  This will be your only indicator the monitor has been turned on.     Do not shower for the first 24 hours.  You may shower after the first 24 hours.   Press button if you feel a symptom. You will hear a small click.  Record Date, Time and Symptom in the Patient Log Book.   When you are ready to remove patch, follow instructions on last 2 pages of Patient Log Book.  Stick patch monitor onto last page of Patient Log Book.   Place Patient Log Book in [place] box.  Use locking tab on box and tape  box closed securely.  The [place] [employer] has [employer] on it.  Please place in mailbox as soon as possible.  Your physician should have your test results approximately 7 days after the monitor has been mailed back to [facility].   Call [place] at [phone] if you have questions regarding your ZIO XT patch monitor.  Call them immediately if you see an orange light blinking on your monitor.   If your monitor falls off in less than 4 days contact our Monitor department at [phone].  If your monitor becomes loose or falls off after 4 days call [employer] at [phone] for suggestions on securing your monitor.      Follow-Up: At [facility], you and your health needs are our priority.  As part of our continuing mission to provide you with exceptional heart care, we have created designated Provider Care Teams.  These Care Teams include your primary Cardiologist (physician) and Advanced Practice Providers (APPs -  Physician Assistants and Nurse Practitioners) who all work together to provide you with the care you need, when you need it.  We recommend signing up for the patient portal called "MyChart".  Sign up information is provided on this After Visit Summary.  MyChart is used to connect with patients for Virtual Visits (Telemedicine).  Patients are able to view lab/test results, encounter notes, upcoming appointments, etc.  Non-urgent messages can be sent to your provider as well.   To learn more about what you can do with MyChart, go to [email or web address].    Your next appointment:   3 month(s)  The format for your next appointment:   In Person  Provider:   Eleonore Chiquito, MD

## 2020-12-02 NOTE — Progress Notes (Signed)
Enrolled patient for a 7 day Zio XT Monitor to be mailed to patients home.  

## 2020-12-03 LAB — BASIC METABOLIC PANEL
BUN/Creatinine Ratio: 14 (ref 10–24)
BUN: 19 mg/dL (ref 8–27)
CO2: 19 mmol/L — ABNORMAL LOW (ref 20–29)
Calcium: 8.8 mg/dL (ref 8.6–10.2)
Chloride: 109 mmol/L — ABNORMAL HIGH (ref 96–106)
Creatinine, Ser: 1.36 mg/dL — ABNORMAL HIGH (ref 0.76–1.27)
GFR calc Af Amer: 54 mL/min/{1.73_m2} — ABNORMAL LOW (ref >59)
GFR calc non Af Amer: 47 mL/min/{1.73_m2} — ABNORMAL LOW (ref >59)
Glucose: 93 mg/dL (ref 65–99)
Potassium: 4.7 mmol/L (ref 3.5–5.2)
Sodium: 142 mmol/L (ref 134–144)

## 2020-12-03 LAB — CBC
Hematocrit: 25.3 % — ABNORMAL LOW (ref 37.5–51.0)
Hemoglobin: 8.1 g/dL — ABNORMAL LOW (ref 13.0–17.7)
MCH: 27.6 pg (ref 26.6–33.0)
MCHC: 32 g/dL (ref 31.5–35.7)
MCV: 86 fL (ref 79–97)
Platelets: 246 10*3/uL (ref 150–450)
RBC: 2.93 x10E6/uL — ABNORMAL LOW (ref 4.14–5.80)
RDW: 12.5 % (ref 11.6–15.4)
WBC: 3.4 10*3/uL (ref 3.4–10.8)

## 2020-12-03 LAB — TSH: TSH: 1.79 u[IU]/mL (ref 0.450–4.500)

## 2020-12-03 LAB — BRAIN NATRIURETIC PEPTIDE: BNP: 237.3 pg/mL — ABNORMAL HIGH (ref 0.0–100.0)

## 2021-01-12 ENCOUNTER — Telehealth: Payer: Self-pay | Admitting: Physician Assistant

## 2021-01-12 NOTE — Telephone Encounter (Signed)
Received a new hem referral from Dr. Ronnald Ramp for nutritional anemia. [name] [name] has been scheduled to see [name] [name] on 3/16 at 11am. Appt date and time has been given to the pt's wife. Aware to arrive 20 minuets early.

## 2021-01-13 NOTE — Progress Notes (Signed)
[place] [phone]   Fax:(336) [place] NOTE  Patient Care Team: Kristie Cowman, MD as PCP - General (Family Medicine) O'Neal, Cassie Freer, MD as PCP - Cardiology (Cardiology)  Hematological/Oncological History 1) Labs from [facility]: -5//24/2021: Ferritin 40.40, Folate 14.31, Iron 65 (L), TIBC 450 (H), Vitamin B12 300, WBC 3.6 (L), Hgb 14.8, MCV 91, Plt 163K -04/29/2020: Ferritin 35.20, Folate 10.86,  Iron 73, TBC 413 (H), Vitamin B12 481,  -10/07/2020: Ferritin 26.20, Folate 6.62, Iron 49 (L), TIBC 442 (H), Vitamin B12 361, WBC 4.4, Hgb 12.7, MCV 95, Plt 177K -12/09/2020: Ferritin 17.80 (L), Folate 7.98, Iron 16 (L), TIBC 471 (H), Vitamin B12 226, WBC 3.7 (L), Hgb 7.8 (L), MCV 90, Plt 261K. Hemoccult positive.  -01/05/2021: Ferritin 16.70 (L), Folate 7.98, Iron 9 (L), TIBC 457 (H), Vitamin B12 174 (L), Hgb 9.3, MCV 87, WBC 4.7, Plt 258K 2) Establish care with Dede Query PA-C   CHIEF COMPLAINTS/PURPOSE OF CONSULTATION:  "Nutritional anemia "  HISTORY OF PRESENTING ILLNESS:  [name] [name] 87 y.o. male with medical history significant for prostate cancer, hypothyroidism and hypertension  On review of the previous records, [name] [name] was started on oral iron supplementation in January 2021. Labs were routinely drawn and most recent labs from 3/7/222 revealed persistent iron deficiency anemia and B12 deficiency.   On exam today, [name] [name] reports gradual decline in his energy levels for the past six months. He continues to complete all his ADLs on his own but requires frequent resting.  Patient reports having a good appetite but has noticed a 15 pound weight loss over 3 months that has been unintentional.  He denies any changes to his diet or exercise routine.  He does not eat any red meat or [place].  He tries to incorporate iron rich foods into his diet such as spinach.  He denies any nausea or vomiting.  He has intermittent episodes  of generalized abdominal pain without any specific triggers that resolve on its own.  Patient has intermittent episodes of constipation with a bowel movement every other day.  He does take MiraLAX daily.  He denies any hematochezia or melena.  Patient reports shortness of breath, mainly with exertion.  He reports dizziness but denies any presyncope or syncopal episodes. 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 He denies any interference with grip or balance at this time.   Patient denies any fevers, chills, night sweats, chest pain, cough, easy bruising or signs of bleeding. He has no other complaints. Rest of the 10 point ROS is below.   MEDICAL HISTORY:  Past Medical History:  Diagnosis Date  . Hypertension   . Hypothyroidism   . Prostate CA ([place])   . Prostate cancer [facility])     SURGICAL HISTORY: Past Surgical History:  Procedure Laterality Date  . HIP SURGERY      SOCIAL HISTORY: Social History   Socioeconomic History  . Marital status: Married    Spouse name: Not on file  . Number of children: 3  . Years of education: Not on file  . Highest education level: Not on file  Occupational History  . Occupation: retired   Tobacco Use  . Smoking status: Former Smoker    Years: 30.00  . Smokeless tobacco: Never Used  Substance and Sexual Activity  . Alcohol use: Not Currently    Comment: Stopped 30 years ago  . Drug use: Never  .  Sexual activity: Not on file  Other Topics Concern  . Not on file  Social History Narrative  . Not on file   Social Determinants of Health   Financial Resource Strain: Not on file  Food Insecurity: Not on file  Transportation Needs: Not on file  Physical Activity: Not on file  Stress: Not on file  Social Connections: Not on file  Intimate Partner Violence: Not on file    FAMILY HISTORY: Family History  Problem Relation Age of Onset  . Heart disease Mother   . Throat cancer  Mother   . Hypertension Father   . Prostate cancer Brother   . Prostate cancer Nephew     ALLERGIES:  is allergic to sildenafil.  MEDICATIONS:  Current Outpatient Medications  Medication Sig Dispense Refill  . vitamin B-12 (CYANOCOBALAMIN) 1000 MCG tablet Take 1 tablet (1,000 mcg total) by mouth daily. 30 tablet 2  . acetaminophen (TYLENOL) 500 MG tablet Take 500 mg by mouth every 6 (six) hours as needed.    . Cholecalciferol (VITAMIN D3) 25 MCG (1000 UT) CAPS Take 1,000 Units by mouth daily.    Marland Kitchen esomeprazole (NEXIUM) 40 MG capsule Take 40 mg by mouth daily.    . ferrous gluconate (FERGON) 324 MG tablet Take 648 mg by mouth daily.    Marland Kitchen levothyroxine (SYNTHROID) 137 MCG tablet Take 137 mcg by mouth daily.    Marland Kitchen lisinopril (ZESTRIL) 5 MG tablet Take 10 mg by mouth daily.     . tamsulosin (FLOMAX) 0.4 MG CAPS capsule Take 0.4 mg by mouth daily.    . traMADol (ULTRAM) 50 MG tablet Take 50 mg by mouth every 4 (four) hours as needed.    . Vitamin D, Ergocalciferol, (DRISDOL) 1.25 MG (50000 UT) CAPS capsule Take 50,000 Units by mouth once a week.     No current facility-administered medications for this visit.    REVIEW OF SYSTEMS:   Constitutional: ( - ) fevers, ( - )  chills , ( - ) night sweats Eyes: ( - ) blurriness of vision, ( - ) double vision, ( - ) watery eyes Ears, nose, mouth, throat, and face: ( - ) mucositis, ( - ) sore throat Respiratory: ( - ) cough, ( + ) dyspnea, ( - ) wheezes Cardiovascular: ( - ) palpitation, ( - ) chest discomfort, ( - ) lower extremity swelling Gastrointestinal:  ( - ) nausea, ( - ) heartburn, ( - ) change in bowel habits Skin: ( - ) abnormal skin rashes Lymphatics: ( - ) new lymphadenopathy, ( - ) easy bruising Neurological: (+ ) numbness, ( - ) tingling, ( - ) new weaknesses Behavioral/Psych: ( - ) mood change, ( - ) new changes  All other systems were reviewed with the patient and are negative.  PHYSICAL EXAMINATION: ECOG PERFORMANCE STATUS: 1  - Symptomatic but completely ambulatory  Vitals:   01/14/21 1156  BP: (!) 116/52  Pulse: 61  Resp: 15  Temp: 97.7 F (36.5 C)  SpO2: 100%   Filed Weights   01/14/21 1156  Weight: 214 lb 6.4 oz (97.3 kg)    GENERAL: well appearing male in NAD  SKIN: skin color, texture, turgor are normal, no rashes or significant lesions EYES: conjunctiva are pink and non-injected, sclera clear OROPHARYNX: no exudate, no erythema; lips, buccal mucosa, and tongue normal  NECK: supple, non-tender LYMPH:  no palpable lymphadenopathy in the cervical, axillary or supraclavicular lymph nodes.  LUNGS: clear to auscultation and percussion with normal  breathing effort HEART: regular rate & rhythm and no murmurs. +1 lower extremity edema (L>R) ABDOMEN: soft, non-tender, non-distended, normal bowel sounds Musculoskeletal: no cyanosis of digits and no clubbing  PSYCH: alert & oriented x 3, fluent speech NEURO: no focal motor/sensory deficits   LABORATORY DATA:  I have reviewed the data as listed CBC Latest Ref Rng & Units 01/14/2021 12/02/2020 11/01/2019  WBC 4.0 - 10.5 K/uL 3.8(L) 3.4 4.3  Hemoglobin 13.0 - 17.0 g/dL 9.0(L) 8.1(L) 10.0(L)  Hematocrit 39.0 - 52.0 % 30.7(L) 25.3(L) 32.4(L)  Platelets 150 - 400 K/uL 243 246 212    CMP Latest Ref Rng & Units 01/14/2021 12/02/2020 11/01/2019  Glucose 70 - 99 mg/dL 93 93 75  BUN 8 - 23 mg/dL 15 19 19   Creatinine 0.61 - 1.24 mg/dL 1.26(H) 1.36(H) 1.28(H)  Sodium 135 - 145 mmol/L 140 142 139  Potassium 3.5 - 5.1 mmol/L 4.1 4.7 4.4  Chloride 98 - 111 mmol/L 110 109(H) 106  CO2 22 - 32 mmol/L 25 19(L) 19(L)  Calcium 8.9 - 10.3 mg/dL 8.9 8.8 9.0  Total Protein 6.5 - 8.1 g/dL 6.9 - -  Total Bilirubin 0.3 - 1.2 mg/dL 1.0 - -  Alkaline Phos 38 - 126 U/L 67 - -  AST 15 - 41 U/L 16 - -  ALT 0 - 44 U/L 8 - -   ASSESSMENT & PLAN DUNBAR BURAS is a 87 y.o. male presenting to the clinic for evaluation for anemia.  Based on review of outside records, patient has  anemia secondary to iron and B12 deficiency.  Given the failure of p.o. iron to increase the patient's hemoglobin and iron levels, he will require IV iron therapy.  Recommend Feraheme 510 mg q. 7 days x 2 doses.  In addition, recommend B12 replacement with oral B12 1000 mcg p.o. daily.  Patient will need to follow-up with GI to further evaluate underlying cause for iron deficiency anemia.  #Iron deficiency anemia: --Labs today to check CBC, CMP, iron and TIBC, ferritin, retic panel, SPEP, and PSA level.  --Patient is currently on PO iron but hemoglobin is still below normal. Plan to give IV iron with Feraheme 510 mg q 7 days x 2 doses starting next week.  --Patient was evaluated by Dr. Koleen Distance from [place] and underwent colonoscopy with no signs of active bleeding. (requested records). Patient is scheduled for a follow up on 01/21/21. Recommend EGD to rule out upper GI bleeding.  --RTC 4-6 weeks after IV iron infusion with labs.   #B12 deficiency: --will recheck B12 levels along with MMA and homocysteine levels.  --Recommend oral B12 replacement. Sent prescription of oral B12 1000 mcg PO daily.   Orders Placed This Encounter  Procedures  . CBC with Differential (Cancer Center Only)    Standing Status:   Future    Number of Occurrences:   1    Standing Expiration Date:   01/14/2022  . CMP ([place] only)    Standing Status:   Future    Number of Occurrences:   1    Standing Expiration Date:   01/14/2022  . Iron and TIBC    Standing Status:   Future    Number of Occurrences:   1    Standing Expiration Date:   01/14/2022  . Ferritin    Standing Status:   Future    Number of Occurrences:   1    Standing Expiration Date:   01/14/2022  . Retic Panel  Standing Status:   Future    Number of Occurrences:   1    Standing Expiration Date:   01/14/2022  . Vitamin B12    Standing Status:   Future    Number of Occurrences:   1    Standing Expiration Date:   01/14/2022  .  Methylmalonic acid, serum    Standing Status:   Future    Number of Occurrences:   1    Standing Expiration Date:   01/14/2022  . Homocysteine, serum    Standing Status:   Future    Number of Occurrences:   1    Standing Expiration Date:   01/14/2022  . SPEP (Serum protein electrophoresis)    Standing Status:   Future    Number of Occurrences:   1    Standing Expiration Date:   01/14/2022  . Prostate-Specific AG, Serum    Standing Status:   Future    Number of Occurrences:   1    Standing Expiration Date:   01/14/2022    All questions were answered. The patient knows to call the clinic with any problems, questions or concerns.  A total of 60 minutes were spent on this encounter and over half of that time was spent on counseling and coordination of care as outlined above.    Dede Query, PA-C Department of Hematology/Oncology [place] at [facility] Phone: [phone]  Patient was seen with Dr. Lorenso Courier.  I have read the above note and personally examined the patient. I agree with the assessment and plan as noted above.  Briefly [name] [name] is an 87 year old male with anemia likely due to Vitamin b12 deficiency and iron deficiency. He has been on PO iron therapy which has not been effective in increasing his Hgb. As such we recommend IV iron therapy. Additionally the source of his iron deficiency is not clear. It may be due to decreased intake of iron rich foods or poor absorption. He had a colonoscopy performed at [facility] and we recommend continued evaluation with GI for a source of the iron deficiency. We will plan to see him back 4-6 weeks after his last dose of IV iron therapy.    Ledell Peoples, MD Department of Hematology/Oncology [place] at [facility] Phone: [phone] Pager: [phone] Email: [email or web address]

## 2021-01-14 ENCOUNTER — Encounter: Payer: Self-pay | Admitting: Physician Assistant

## 2021-01-14 ENCOUNTER — Other Ambulatory Visit: Payer: Self-pay

## 2021-01-14 ENCOUNTER — Inpatient Hospital Stay: Payer: Medicare Other | Admitting: Physician Assistant

## 2021-01-14 ENCOUNTER — Inpatient Hospital Stay: Payer: Medicare Other | Attending: Physician Assistant

## 2021-01-14 VITALS — BP 116/52 | HR 61 | Temp 97.7°F | Resp 15 | Ht 76.0 in | Wt 214.4 lb

## 2021-01-14 DIAGNOSIS — Z87891 Personal history of nicotine dependence: Secondary | ICD-10-CM | POA: Insufficient documentation

## 2021-01-14 DIAGNOSIS — E538 Deficiency of other specified B group vitamins: Secondary | ICD-10-CM | POA: Insufficient documentation

## 2021-01-14 DIAGNOSIS — Z8546 Personal history of malignant neoplasm of prostate: Secondary | ICD-10-CM | POA: Diagnosis not present

## 2021-01-14 DIAGNOSIS — D509 Iron deficiency anemia, unspecified: Secondary | ICD-10-CM

## 2021-01-14 DIAGNOSIS — Z801 Family history of malignant neoplasm of trachea, bronchus and lung: Secondary | ICD-10-CM | POA: Insufficient documentation

## 2021-01-14 DIAGNOSIS — I10 Essential (primary) hypertension: Secondary | ICD-10-CM | POA: Insufficient documentation

## 2021-01-14 LAB — CMP (CANCER CENTER ONLY)
ALT: 8 U/L (ref 0–44)
AST: 16 U/L (ref 15–41)
Albumin: 3.6 g/dL (ref 3.5–5.0)
Alkaline Phosphatase: 67 U/L (ref 38–126)
Anion gap: 5 (ref 5–15)
BUN: 15 mg/dL (ref 8–23)
CO2: 25 mmol/L (ref 22–32)
Calcium: 8.9 mg/dL (ref 8.9–10.3)
Chloride: 110 mmol/L (ref 98–111)
Creatinine: 1.26 mg/dL — ABNORMAL HIGH (ref 0.61–1.24)
GFR, Estimated: 56 mL/min — ABNORMAL LOW (ref >60)
Glucose, Bld: 93 mg/dL (ref 70–99)
Potassium: 4.1 mmol/L (ref 3.5–5.1)
Sodium: 140 mmol/L (ref 135–145)
Total Bilirubin: 1 mg/dL (ref 0.3–1.2)
Total Protein: 6.9 g/dL (ref 6.5–8.1)

## 2021-01-14 LAB — RETIC PANEL
Immature Retic Fract: 24.9 % — ABNORMAL HIGH (ref 2.3–15.9)
RBC.: 3.49 MIL/uL — ABNORMAL LOW (ref 4.22–5.81)
Retic Count, Absolute: 58.3 10*3/uL (ref 19.0–186.0)
Retic Ct Pct: 1.7 % (ref 0.4–3.1)
Reticulocyte Hemoglobin: 25.3 pg — ABNORMAL LOW (ref >27.9)

## 2021-01-14 LAB — CBC WITH DIFFERENTIAL (CANCER CENTER ONLY)
Abs Immature Granulocytes: 0.01 10*3/uL (ref 0.00–0.07)
Basophils Absolute: 0 10*3/uL (ref 0.0–0.1)
Basophils Relative: 1 %
Eosinophils Absolute: 0.1 10*3/uL (ref 0.0–0.5)
Eosinophils Relative: 2 %
HCT: 30.7 % — ABNORMAL LOW (ref 39.0–52.0)
Hemoglobin: 9 g/dL — ABNORMAL LOW (ref 13.0–17.0)
Immature Granulocytes: 0 %
Lymphocytes Relative: 27 %
Lymphs Abs: 1 10*3/uL (ref 0.7–4.0)
MCH: 25.1 pg — ABNORMAL LOW (ref 26.0–34.0)
MCHC: 29.3 g/dL — ABNORMAL LOW (ref 30.0–36.0)
MCV: 85.5 fL (ref 80.0–100.0)
Monocytes Absolute: 0.3 10*3/uL (ref 0.1–1.0)
Monocytes Relative: 7 %
Neutro Abs: 2.4 10*3/uL (ref 1.7–7.7)
Neutrophils Relative %: 63 %
Platelet Count: 243 10*3/uL (ref 150–400)
RBC: 3.59 MIL/uL — ABNORMAL LOW (ref 4.22–5.81)
RDW: 14.6 % (ref 11.5–15.5)
WBC Count: 3.8 10*3/uL — ABNORMAL LOW (ref 4.0–10.5)
nRBC: 0 % (ref 0.0–0.2)

## 2021-01-14 LAB — FERRITIN: Ferritin: 26 ng/mL (ref 24–336)

## 2021-01-14 LAB — IRON AND TIBC
Iron: 18 ug/dL — ABNORMAL LOW (ref 42–163)
Saturation Ratios: 5 % — ABNORMAL LOW (ref 20–55)
TIBC: 365 ug/dL (ref 202–409)
UIBC: 347 ug/dL (ref 117–376)

## 2021-01-14 LAB — VITAMIN B12: Vitamin B-12: 135 pg/mL — ABNORMAL LOW (ref 180–914)

## 2021-01-14 MED ORDER — VITAMIN B-12 1000 MCG PO TABS: 1000.0000 ug | ORAL_TABLET | Freq: Every day | ORAL | 2 refills | Status: DC

## 2021-01-15 LAB — PROSTATE-SPECIFIC AG, SERUM (LABCORP): Prostate Specific Ag, Serum: 0.1 ng/mL (ref 0.0–4.0)

## 2021-01-15 LAB — HOMOCYSTEINE: Homocysteine: 28 umol/L — ABNORMAL HIGH (ref 0.0–21.3)

## 2021-01-16 LAB — PROTEIN ELECTROPHORESIS, SERUM
A/G Ratio: 1.2 (ref 0.7–1.7)
Albumin ELP: 3.6 g/dL (ref 2.9–4.4)
Alpha-1-Globulin: 0.3 g/dL (ref 0.0–0.4)
Alpha-2-Globulin: 0.7 g/dL (ref 0.4–1.0)
Beta Globulin: 1 g/dL (ref 0.7–1.3)
Gamma Globulin: 1 g/dL (ref 0.4–1.8)
Globulin, Total: 3 g/dL (ref 2.2–3.9)
Total Protein ELP: 6.6 g/dL (ref 6.0–8.5)

## 2021-01-17 LAB — METHYLMALONIC ACID, SERUM: Methylmalonic Acid, Quantitative: 269 nmol/L (ref 0–378)

## 2021-01-19 ENCOUNTER — Telehealth: Payer: Self-pay | Admitting: Physician Assistant

## 2021-01-19 NOTE — Telephone Encounter (Signed)
Called and spoke with patient and his wife to inform of iron infusion appts for 3/22 and 3/30 at the [place] location. Gave address and phone number for [facility] and confirmed appts

## 2021-01-20 ENCOUNTER — Inpatient Hospital Stay: Payer: Medicare Other

## 2021-01-20 ENCOUNTER — Other Ambulatory Visit: Payer: Self-pay

## 2021-01-20 VITALS — BP 141/62 | HR 58 | Temp 97.8°F | Resp 17

## 2021-01-20 DIAGNOSIS — D509 Iron deficiency anemia, unspecified: Secondary | ICD-10-CM

## 2021-01-20 MED ORDER — SODIUM CHLORIDE 0.9 % IV SOLN
510.0000 mg | Freq: Once | INTRAVENOUS | Status: AC
  Administered 2021-01-20: 510 mg via INTRAVENOUS
  Filled 2021-01-20: qty 510

## 2021-01-20 MED ORDER — SODIUM CHLORIDE 0.9 % IV SOLN
Freq: Once | INTRAVENOUS | Status: AC
  Filled 2021-01-20: qty 250

## 2021-01-20 NOTE — Patient Instructions (Signed)
Ferumoxytol injection What is this medicine? FERUMOXYTOL is an iron complex. Iron is used to make healthy red blood cells, which carry oxygen and nutrients throughout the body. This medicine is used to treat iron deficiency anemia. This medicine may be used for other purposes; ask your health care provider or pharmacist if you have questions. COMMON BRAND NAME(S): Feraheme What should I tell my health care provider before I take this medicine? They need to know if you have any of these conditions:  anemia not caused by low iron levels  high levels of iron in the blood  magnetic resonance imaging (MRI) test scheduled  an unusual or allergic reaction to iron, other medicines, foods, dyes, or preservatives  pregnant or trying to get pregnant  breast-feeding How should I use this medicine? This medicine is for injection into a vein. It is given by a health care professional in a hospital or clinic setting. Talk to your pediatrician regarding the use of this medicine in children. Special care may be needed. Overdosage: If you think you have taken too much of this medicine contact a poison control center or emergency room at once. NOTE: This medicine is only for you. Do not share this medicine with others. What if I miss a dose? It is important not to miss your dose. Call your doctor or health care professional if you are unable to keep an appointment. What may interact with this medicine? This medicine may interact with the following medications:  other iron products This list may not describe all possible interactions. Give your health care provider a list of all the medicines, herbs, non-prescription drugs, or dietary supplements you use. Also tell them if you smoke, drink alcohol, or use illegal drugs. Some items may interact with your medicine. What should I watch for while using this medicine? Visit your doctor or healthcare professional regularly. Tell your doctor or healthcare  professional if your symptoms do not start to get better or if they get worse. You may need blood work done while you are taking this medicine. You may need to follow a special diet. Talk to your doctor. Foods that contain iron include: whole grains/cereals, dried fruits, beans, or peas, leafy green vegetables, and organ meats (liver, kidney). What side effects may I notice from receiving this medicine? Side effects that you should report to your doctor or health care professional as soon as possible:  allergic reactions like skin rash, itching or hives, swelling of the face, lips, or tongue  breathing problems  changes in blood pressure  feeling faint or lightheaded, falls  fever or chills  flushing, sweating, or hot feelings  swelling of the ankles or feet Side effects that usually do not require medical attention (report to your doctor or health care professional if they continue or are bothersome):  diarrhea  headache  nausea, vomiting  stomach pain This list may not describe all possible side effects. Call your doctor for medical advice about side effects. You may report side effects to FDA at 1-800-FDA-1088. Where should I keep my medicine? This drug is given in a hospital or clinic and will not be stored at home. NOTE: This sheet is a summary. It may not cover all possible information. If you have questions about this medicine, talk to your doctor, pharmacist, or health care provider.  2021 Elsevier/Gold Standard (2016-12-06 20:21:10)  

## 2021-01-28 ENCOUNTER — Inpatient Hospital Stay: Payer: Medicare Other

## 2021-01-28 ENCOUNTER — Other Ambulatory Visit: Payer: Self-pay

## 2021-01-28 VITALS — BP 149/69 | HR 51 | Temp 98.0°F | Resp 18

## 2021-01-28 DIAGNOSIS — D509 Iron deficiency anemia, unspecified: Secondary | ICD-10-CM

## 2021-01-28 MED ORDER — SODIUM CHLORIDE 0.9 % IV SOLN
510.0000 mg | Freq: Once | INTRAVENOUS | Status: AC
  Administered 2021-01-28: 510 mg via INTRAVENOUS
  Filled 2021-01-28: qty 17

## 2021-01-28 MED ORDER — SODIUM CHLORIDE 0.9 % IV SOLN
Freq: Once | INTRAVENOUS | Status: AC
  Filled 2021-01-28: qty 250

## 2021-01-28 NOTE — Patient Instructions (Signed)
Ferumoxytol injection What is this medicine? FERUMOXYTOL is an iron complex. Iron is used to make healthy red blood cells, which carry oxygen and nutrients throughout the body. This medicine is used to treat iron deficiency anemia. This medicine may be used for other purposes; ask your health care provider or pharmacist if you have questions. COMMON BRAND NAME(S): Feraheme What should I tell my health care provider before I take this medicine? They need to know if you have any of these conditions:  anemia not caused by low iron levels  high levels of iron in the blood  magnetic resonance imaging (MRI) test scheduled  an unusual or allergic reaction to iron, other medicines, foods, dyes, or preservatives  pregnant or trying to get pregnant  breast-feeding How should I use this medicine? This medicine is for injection into a vein. It is given by a health care professional in a hospital or clinic setting. Talk to your pediatrician regarding the use of this medicine in children. Special care may be needed. Overdosage: If you think you have taken too much of this medicine contact a poison control center or emergency room at once. NOTE: This medicine is only for you. Do not share this medicine with others. What if I miss a dose? It is important not to miss your dose. Call your doctor or health care professional if you are unable to keep an appointment. What may interact with this medicine? This medicine may interact with the following medications:  other iron products This list may not describe all possible interactions. Give your health care provider a list of all the medicines, herbs, non-prescription drugs, or dietary supplements you use. Also tell them if you smoke, drink alcohol, or use illegal drugs. Some items may interact with your medicine. What should I watch for while using this medicine? Visit your doctor or healthcare professional regularly. Tell your doctor or healthcare  professional if your symptoms do not start to get better or if they get worse. You may need blood work done while you are taking this medicine. You may need to follow a special diet. Talk to your doctor. Foods that contain iron include: whole grains/cereals, dried fruits, beans, or peas, leafy green vegetables, and organ meats (liver, kidney). What side effects may I notice from receiving this medicine? Side effects that you should report to your doctor or health care professional as soon as possible:  allergic reactions like skin rash, itching or hives, swelling of the face, lips, or tongue  breathing problems  changes in blood pressure  feeling faint or lightheaded, falls  fever or chills  flushing, sweating, or hot feelings  swelling of the ankles or feet Side effects that usually do not require medical attention (report to your doctor or health care professional if they continue or are bothersome):  diarrhea  headache  nausea, vomiting  stomach pain This list may not describe all possible side effects. Call your doctor for medical advice about side effects. You may report side effects to FDA at 1-800-FDA-1088. Where should I keep my medicine? This drug is given in a hospital or clinic and will not be stored at home. NOTE: This sheet is a summary. It may not cover all possible information. If you have questions about this medicine, talk to your doctor, pharmacist, or health care provider.  2021 Elsevier/Gold Standard (2016-12-06 20:21:10)  

## 2021-02-24 ENCOUNTER — Other Ambulatory Visit: Payer: Self-pay | Admitting: Physician Assistant

## 2021-02-24 DIAGNOSIS — E538 Deficiency of other specified B group vitamins: Secondary | ICD-10-CM

## 2021-02-24 DIAGNOSIS — D508 Other iron deficiency anemias: Secondary | ICD-10-CM

## 2021-02-24 NOTE — Progress Notes (Deleted)
[place] Telephone:(336) [phone]   Fax:(336) [place] NOTE  Patient Care Team: Kristie Cowman, MD as PCP - General (Family Medicine) O'Neal, Cassie Freer, MD as PCP - Cardiology (Cardiology)  Hematological/Oncological History 1) Labs from [facility]: -5//24/2021: Ferritin 40.40, Folate 14.31, Iron 65 (L), TIBC 450 (H), Vitamin B12 300, WBC 3.6 (L), Hgb 14.8, MCV 91, Plt 163K -04/29/2020: Ferritin 35.20, Folate 10.86,  Iron 73, TBC 413 (H), Vitamin B12 481,  -10/07/2020: Ferritin 26.20, Folate 6.62, Iron 49 (L), TIBC 442 (H), Vitamin B12 361, WBC 4.4, Hgb 12.7, MCV 95, Plt 177K -12/09/2020: Ferritin 17.80 (L), Folate 7.98, Iron 16 (L), TIBC 471 (H), Vitamin B12 226, WBC 3.7 (L), Hgb 7.8 (L), MCV 90, Plt 261K. Hemoccult positive.  -01/05/2021: Ferritin 16.70 (L), Folate 7.98, Iron 9 (L), TIBC 457 (H), Vitamin B12 174 (L), Hgb 9.3, MCV 87, WBC 4.7, Plt 258K  2) Establish care with Dede Query PA-C -Found to have iron deficiency so recommended IV feraheme x 2 doses. In addition, patient was B12 deficient so recommended oral vitamin B12 1000 mcg daily.   3) Received IV feraheme x 2 doses on 01/20/2021 and 01/28/2021.   CHIEF COMPLAINTS/PURPOSE OF CONSULTATION:  "Iron and B12 deficiency anemia "  HISTORY OF PRESENTING ILLNESS:  [name] [name] 87 y.o. male with medical history significant for prostate cancer, hypothyroidism, hypertension, iron deficiency and B12 deficiency anemia.    On exam today, *** MEDICAL HISTORY:  Past Medical History:  Diagnosis Date  . Hypertension   . Hypothyroidism   . Prostate CA ([place])   . Prostate cancer [facility])     SURGICAL HISTORY: Past Surgical History:  Procedure Laterality Date  . HIP SURGERY      SOCIAL HISTORY: Social History   Socioeconomic History  . Marital status: Married    Spouse name: Not on file  . Number of children: 3  . Years of education: Not on file  . Highest education level: Not on  file  Occupational History  . Occupation: retired   Tobacco Use  . Smoking status: Former Smoker    Years: 30.00  . Smokeless tobacco: Never Used  Substance and Sexual Activity  . Alcohol use: Not Currently    Comment: Stopped 30 years ago  . Drug use: Never  . Sexual activity: Not on file  Other Topics Concern  . Not on file  Social History Narrative  . Not on file   Social Determinants of Health   Financial Resource Strain: Not on file  Food Insecurity: Not on file  Transportation Needs: Not on file  Physical Activity: Not on file  Stress: Not on file  Social Connections: Not on file  Intimate Partner Violence: Not on file    FAMILY HISTORY: Family History  Problem Relation Age of Onset  . Heart disease Mother   . Throat cancer Mother   . Hypertension Father   . Prostate cancer Brother   . Prostate cancer Nephew     ALLERGIES:  is allergic to sildenafil.  MEDICATIONS:  Current Outpatient Medications  Medication Sig Dispense Refill  . acetaminophen (TYLENOL) 500 MG tablet Take 500 mg by mouth every 6 (six) hours as needed.    . Cholecalciferol (VITAMIN D3) 25 MCG (1000 UT) CAPS Take 1,000 Units by mouth daily.    Marland Kitchen esomeprazole (NEXIUM) 40 MG capsule Take 40 mg by mouth daily.    . ferrous gluconate (FERGON) 324 MG tablet Take 648 mg by mouth  daily.    . levothyroxine (SYNTHROID) 137 MCG tablet Take 137 mcg by mouth daily.    Marland Kitchen lisinopril (ZESTRIL) 5 MG tablet Take 10 mg by mouth daily.     . tamsulosin (FLOMAX) 0.4 MG CAPS capsule Take 0.4 mg by mouth daily.    . traMADol (ULTRAM) 50 MG tablet Take 50 mg by mouth every 4 (four) hours as needed.    . vitamin B-12 (CYANOCOBALAMIN) 1000 MCG tablet Take 1 tablet (1,000 mcg total) by mouth daily. 30 tablet 2  . Vitamin D, Ergocalciferol, (DRISDOL) 1.25 MG (50000 UT) CAPS capsule Take 50,000 Units by mouth once a week.     No current facility-administered medications for this visit.    REVIEW OF SYSTEMS:    Constitutional: ( - ) fevers, ( - )  chills , ( - ) night sweats Eyes: ( - ) blurriness of vision, ( - ) double vision, ( - ) watery eyes Ears, nose, mouth, throat, and face: ( - ) mucositis, ( - ) sore throat Respiratory: ( - ) cough, ( + ) dyspnea, ( - ) wheezes Cardiovascular: ( - ) palpitation, ( - ) chest discomfort, ( - ) lower extremity swelling Gastrointestinal:  ( - ) nausea, ( - ) heartburn, ( - ) change in bowel habits Skin: ( - ) abnormal skin rashes Lymphatics: ( - ) new lymphadenopathy, ( - ) easy bruising Neurological: (+ ) numbness, ( - ) tingling, ( - ) new weaknesses Behavioral/Psych: ( - ) mood change, ( - ) new changes  All other systems were reviewed with the patient and are negative.  PHYSICAL EXAMINATION: ECOG PERFORMANCE STATUS: 1 - Symptomatic but completely ambulatory  There were no vitals filed for this visit. There were no vitals filed for this visit.  GENERAL: well appearing male in NAD  SKIN: skin color, texture, turgor are normal, no rashes or significant lesions EYES: conjunctiva are pink and non-injected, sclera clear OROPHARYNX: no exudate, no erythema; lips, buccal mucosa, and tongue normal  NECK: supple, non-tender LYMPH:  no palpable lymphadenopathy in the cervical, axillary or supraclavicular lymph nodes.  LUNGS: clear to auscultation and percussion with normal breathing effort HEART: regular rate & rhythm and no murmurs. +1 lower extremity edema (L>R) ABDOMEN: soft, non-tender, non-distended, normal bowel sounds Musculoskeletal: no cyanosis of digits and no clubbing  PSYCH: alert & oriented x 3, fluent speech NEURO: no focal motor/sensory deficits   LABORATORY DATA:  I have reviewed the data as listed CBC Latest Ref Rng & Units 01/14/2021 12/02/2020 11/01/2019  WBC 4.0 - 10.5 K/uL 3.8(L) 3.4 4.3  Hemoglobin 13.0 - 17.0 g/dL 9.0(L) 8.1(L) 10.0(L)  Hematocrit 39.0 - 52.0 % 30.7(L) 25.3(L) 32.4(L)  Platelets 150 - 400 K/uL 243 246 212    CMP  Latest Ref Rng & Units 01/14/2021 12/02/2020 11/01/2019  Glucose 70 - 99 mg/dL 93 93 75  BUN 8 - 23 mg/dL 15 19 19   Creatinine 0.61 - 1.24 mg/dL 1.26(H) 1.36(H) 1.28(H)  Sodium 135 - 145 mmol/L 140 142 139  Potassium 3.5 - 5.1 mmol/L 4.1 4.7 4.4  Chloride 98 - 111 mmol/L 110 109(H) 106  CO2 22 - 32 mmol/L 25 19(L) 19(L)  Calcium 8.9 - 10.3 mg/dL 8.9 8.8 9.0  Total Protein 6.5 - 8.1 g/dL 6.9 - -  Total Bilirubin 0.3 - 1.2 mg/dL 1.0 - -  Alkaline Phos 38 - 126 U/L 67 - -  AST 15 - 41 U/L 16 - -  ALT 0 -  44 U/L 8 - -   ASSESSMENT & PLAN [name] [name] is a 87 y.o. male presenting to the clinic for evaluation for anemia.  Based on review of outside records, patient has anemia secondary to iron and B12 deficiency.  Given the failure of p.o. iron to increase the patient's hemoglobin and iron levels, he will require IV iron therapy.  Recommend Feraheme 510 mg q. 7 days x 2 doses.  In addition, recommend B12 replacement with oral B12 1000 mcg p.o. daily.  Patient will need to follow-up with GI to further evaluate underlying cause for iron deficiency anemia.  #Iron deficiency anemia: --Labs today to check CBC, CMP, iron and TIBC, ferritin, retic panel, SPEP, and PSA level.  --Patient is currently on PO iron but hemoglobin is still below normal. Plan to give IV iron with Feraheme 510 mg q 7 days x 2 doses starting next week.  --Patient was evaluated by Dr. Koleen Distance from [place] and underwent colonoscopy with no signs of active bleeding. (requested records). Patient is scheduled for a follow up on 01/21/21. Recommend EGD to rule out upper GI bleeding. *** --RTC   #B12 deficiency: --Currently on oral B12 1000 mcg PO daily.  --Today's vitamin B12 level is ***  No orders of the defined types were placed in this encounter.   All questions were answered. The patient knows to call the clinic with any problems, questions or concerns.  A total of *** minutes were spent on this encounter and over  half of that time was spent on counseling and coordination of care as outlined above.    Dede Query, PA-C Department of Hematology/Oncology [place] at [facility] Phone: [phone]

## 2021-02-25 ENCOUNTER — Inpatient Hospital Stay: Payer: Medicare Other

## 2021-02-25 ENCOUNTER — Inpatient Hospital Stay: Payer: Medicare Other | Admitting: Physician Assistant

## 2021-02-25 ENCOUNTER — Ambulatory Visit: Payer: Medicare Other | Admitting: Physician Assistant

## 2021-02-25 ENCOUNTER — Other Ambulatory Visit: Payer: Medicare Other

## 2021-03-01 NOTE — Progress Notes (Signed)
Cardiology Office Note:   Date:  03/02/2021  NAME:  [name] [name]    MRN: [id number] DOB:  1932-08-03   PCP:  Kristie Cowman, MD  Cardiologist:  Evalina Field, MD  Electrophysiologist:  None   Referring MD: Kristie Cowman, MD   Chief Complaint  Patient presents with  . Follow-up   History of Present Illness:   [name] [name] is a 87 y.o. male with a hx of HFpEF, HTN, CKD3, DM, trifascicular block who presents for follow-up. Seen 12/02/2020 for dizziness. Monitor planned but not completed. Extensive work-up including normal echo. Normal MPI. ETT without chronotropic incompetence. BNP 237 (improved). Found to be anemic with HGB down to 8.1. Found to have iron deficiency and B12 deficiency. We reached out to EP.  Apparently he did not have his monitor turned on when he wore it.  He wore it for 1 week but it did not record any findings.  Nonetheless, his symptoms have improved.  He is now on iron infusions.  He is also on B12 replacement.  He presents with his wife.  Weight continues to go down.  They are working very diligently on her diet.  He was diagnosed with diabetes which has been cured with diet.  He has no evidence of congestive heart failure.  He is still not an active.  Denies any chest pain or shortness of breath.  His dizziness and symptoms of unsteadiness have improved with iron supplementation as well as his B12 replacement.  He underwent a colonoscopy recently that was negative.  His hematologist has recommended an EGD.  They will look into this.  Regardless, his symptoms are improving.  Problem List 1. 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 HTN 4. CKD 3 5. Diabetes 6. Iron-deficiency anemia 7. B12 deficiency   Past Medical History: Past Medical History:  Diagnosis Date  . Hypertension   .  Hypothyroidism   . Prostate CA ([place])   . Prostate cancer [facility])     Past Surgical History: Past Surgical History:  Procedure Laterality Date  . HIP SURGERY      Current Medications: Current Meds  Medication Sig  . acetaminophen (TYLENOL) 500 MG tablet Take 500 mg by mouth every 6 (six) hours as needed.  . Cholecalciferol (VITAMIN D3) 25 MCG (1000 UT) CAPS Take 1,000 Units by mouth daily.  Marland Kitchen esomeprazole (NEXIUM) 40 MG capsule Take 40 mg by mouth daily.  . ferrous gluconate (FERGON) 324 MG tablet Take 648 mg by mouth daily.  Marland Kitchen levothyroxine (SYNTHROID) 137 MCG tablet Take 137 mcg by mouth daily.  Marland Kitchen lisinopril (ZESTRIL) 5 MG tablet Take 10 mg by mouth daily.   . tamsulosin (FLOMAX) 0.4 MG CAPS capsule Take 0.4 mg by mouth daily.  . traMADol (ULTRAM) 50 MG tablet Take 50 mg by mouth every 4 (four) hours as needed.  . vitamin B-12 (CYANOCOBALAMIN) 1000 MCG tablet Take 1 tablet (1,000 mcg total) by mouth daily.  . Vitamin D, Ergocalciferol, (DRISDOL) 1.25 MG (50000 UT) CAPS capsule Take 50,000 Units by mouth once a week.    Allergies:    Sildenafil   Social History: Social History   Socioeconomic History  . Marital status: Married    Spouse name: Not on file  . Number of children: 3  . Years of education: Not on file  . Highest  education level: Not on file  Occupational History  . Occupation: retired   Tobacco Use  . Smoking status: Former Smoker    Years: 30.00  . Smokeless tobacco: Never Used  Substance and Sexual Activity  . Alcohol use: Not Currently    Comment: Stopped 30 years ago  . Drug use: Never  . Sexual activity: Not on file  Other Topics Concern  . Not on file  Social History Narrative  . Not on file   Social Determinants of Health   Financial Resource Strain: Not on file  Food Insecurity: Not on file  Transportation Needs: Not on file  Physical Activity: Not on file  Stress: Not on file  Social Connections: Not on file     Family History: The  patient's family history includes Heart disease in his mother; Hypertension in his father; Prostate cancer in his brother and nephew; Throat cancer in his mother.  ROS:   All other ROS reviewed and negative. Pertinent positives noted in the HPI.     EKGs/Labs/Other Studies Reviewed:   The following studies were personally reviewed by me today:  TTE 11/15/2019 1. Left ventricular ejection fraction, by visual estimation, is 55 to  60%. The left ventricle has normal function. There is mildly increased  left ventricular hypertrophy.  2. Left ventricular diastolic parameters are indeterminate.  3. The left ventricle has no regional wall motion abnormalities.  4. Global right ventricle has normal systolic function.The right  ventricular size is normal. No increase in right ventricular wall  thickness.  5. Left atrial size was normal.  6. Right atrial size was normal.  7. The mitral valve is normal in structure. Trivial mitral valve  regurgitation.  8. The tricuspid valve is normal in structure.  9. The aortic valve is tricuspid. Aortic valve regurgitation is not  visualized. Mild aortic valve sclerosis without stenosis.  10. Pulmonic regurgitation is mild.  11. The pulmonic valve was normal in structure. Pulmonic valve  regurgitation is mild.  12. Normal pulmonary artery systolic pressure.  13. The inferior vena cava is dilated in size with >50% respiratory  variability, suggesting right atrial pressure of 8 mmHg.   [place] 12/12/2019 1. Sinus bradycardia with intermittent Wenckebach.  2. Baseline 1AVB noted. 3. 9 sinus pauses in setting of Wenckebach up to 3.4 seconds. No symptoms reported.  4. No atrial fibrillation.   ETT 02/06/2020  Poor exercise capacity (4.6 METS)  Failed to meet target HR. HR increased with exercise from 45bpm to 110bpm (81% max age predicted HR)  Upsloping ST segment depression was noted during stress, appears horizontal in V3. No clear evidence of  ischemia, though study was non-diagnostic due to failure to meet target HR and significant artifact during stress  Recent Labs: 12/02/2020: BNP 237.3; TSH 1.790 01/14/2021: ALT 8; BUN 15; Creatinine 1.26; Hemoglobin 9.0; Platelet Count 243; Potassium 4.1; Sodium 140   Recent Lipid Panel    Component Value Date/Time   CHOL 157 08/25/2009 0000   TRIG 107.0 08/25/2009 0000   HDL 48.00 08/25/2009 0000   CHOLHDL 3 08/25/2009 0000   VLDL 21.4 08/25/2009 0000   LDLCALC 88 08/25/2009 0000    Physical Exam:   VS:  BP (!) 138/50   Pulse 69   Ht 6\' 4"  (1.93 m)   Wt 206 lb 12.8 oz (93.8 kg)   SpO2 98%   BMI 25.17 kg/m    Wt Readings from Last 3 Encounters:  03/02/21 206 lb 12.8 oz (93.8 kg)  01/14/21 214 lb 6.4 oz (97.3 kg)  12/02/20 219 lb (99.3 kg)    General: Well nourished, well developed, in no acute distress Head: Atraumatic, normal size  Eyes: PEERLA, EOMI  Neck: Supple, no JVD Endocrine: No thryomegaly Cardiac: Normal S1, S2; RRR; no murmurs, rubs, or gallops Lungs: Clear to auscultation bilaterally, no wheezing, rhonchi or rales  Abd: Soft, nontender, no hepatomegaly  Ext: No edema, pulses 2+ Musculoskeletal: No deformities, BUE and BLE strength normal and equal Skin: Warm and dry, no rashes   Neuro: Alert and oriented to person, place, time, and situation, CNII-XII grossly intact, no focal deficits  Psych: Normal mood and affect   ASSESSMENT:   [name] [name] is a 87 y.o. male who presents for the following: 1. Dizziness   2. SOB (shortness of breath)   3. Chronic diastolic heart failure ([place])   4. Essential hypertension   5. Sinus bradycardia   6. Trifascicular block     PLAN:   1. Dizziness 2. SOB (shortness of breath) -He was seen for dizziness and shortness of breath in February.  Given his history of sinus bradycardia and trifascicular block we decided to pursue a cardiac monitor.  Apparently the monitor did not record any information.  Regardless, he was found  to be anemic with iron deficiency as well as B12 deficiency.  His symptoms have improved with supplementation of both.  Recent colonoscopy negative for any source of GI bleed.  Possibly he will need an EGD.  I will defer this to his primary care physician as well as hematologist.  Overall his symptoms are improved with supplementation and I suspect anemia was the main culprit here.  He is euvolemic on examination.  Cardiovascular lamination is unchanged and unremarkable.  3. Chronic diastolic heart failure ([place]) 4. Essential hypertension -Euvolemic on examination.  BP well controlled.  We did back off on his blood pressure medications last year.  Given his age I think a more lenient goal would be recommended.  5. Sinus bradycardia 6. Trifascicular block -Echocardiogram shows normal LV function.  Negative nuclear medicine myocardial perfusion imaging study.  Exercise treadmill stress test shows no evidence of chronotropic incompetence.  His symptoms of dizziness have improved with treatment of his anemia.  I do not think his conduction disease explains this.  His cardiac monitor did not record any data recently.  His monitor from over 1 year ago showed no evidence of high-grade conduction disease.  Given that his symptoms have improved with treatment of his anemia we will hold on any further evaluation.  I suspect his symptoms were strictly related to this.  We will keep a close eye on him for worsening of conduction disease.  Currently there is no evidence of this.  Blood disposition: Return in about 6 months (around 09/02/2021).  Medication Adjustments/Labs and Tests Ordered: Current medicines are reviewed at length with the patient today.  Concerns regarding medicines are outlined above.  No orders of the defined types were placed in this encounter.  No orders of the defined types were placed in this encounter.   Patient Instructions  Medication Instructions:  The current medical regimen is  effective;  continue present plan and medications.  *If you need a refill on your cardiac medications before your next appointment, please call your pharmacy*   Follow-Up: At [facility], you and your health needs are our priority.  As part of our continuing mission to provide you with exceptional heart care, we have created designated  Provider Care Teams.  These Care Teams include your primary Cardiologist (physician) and Advanced Practice Providers (APPs -  Physician Assistants and Nurse Practitioners) who all work together to provide you with the care you need, when you need it.  We recommend signing up for the patient portal called "MyChart".  Sign up information is provided on this After Visit Summary.  MyChart is used to connect with patients for Virtual Visits (Telemedicine).  Patients are able to view lab/test results, encounter notes, upcoming appointments, etc.  Non-urgent messages can be sent to your provider as well.   To learn more about what you can do with MyChart, go to [email or web address].    Your next appointment:   6 month(s)  The format for your next appointment:   In Person  Provider:   Eleonore Chiquito, MD        Time Spent with Patient: I have spent a total of 25 minutes with patient reviewing hospital notes, telemetry, EKGs, labs and examining the patient as well as establishing an assessment and plan that was discussed with the patient.  > 50% of time was spent in direct patient care.  Signed, Addison Naegeli. Audie Box, MD, [place]  [address] [place], [place] [address] [phone]  03/02/2021 8:10 AM

## 2021-03-02 ENCOUNTER — Ambulatory Visit: Payer: Medicare Other | Admitting: Cardiovascular Disease

## 2021-03-02 ENCOUNTER — Other Ambulatory Visit: Payer: Self-pay

## 2021-03-02 ENCOUNTER — Encounter: Payer: Self-pay | Admitting: Cardiovascular Disease

## 2021-03-02 VITALS — BP 138/50 | HR 69 | Ht 76.0 in | Wt 206.8 lb

## 2021-03-02 DIAGNOSIS — I10 Essential (primary) hypertension: Secondary | ICD-10-CM

## 2021-03-02 DIAGNOSIS — R42 Dizziness and giddiness: Secondary | ICD-10-CM | POA: Diagnosis not present

## 2021-03-02 DIAGNOSIS — I5032 Chronic diastolic (congestive) heart failure: Secondary | ICD-10-CM

## 2021-03-02 DIAGNOSIS — R001 Bradycardia, unspecified: Secondary | ICD-10-CM

## 2021-03-02 DIAGNOSIS — R0602 Shortness of breath: Secondary | ICD-10-CM

## 2021-03-02 DIAGNOSIS — I453 Trifascicular block: Secondary | ICD-10-CM

## 2021-03-02 NOTE — Patient Instructions (Signed)
Medication Instructions:  The current medical regimen is effective;  continue present plan and medications.  *If you need a refill on your cardiac medications before your next appointment, please call your pharmacy*   Follow-Up: At [facility], you and your health needs are our priority.  As part of our continuing mission to provide you with exceptional heart care, we have created designated Provider Care Teams.  These Care Teams include your primary Cardiologist (physician) and Advanced Practice Providers (APPs -  Physician Assistants and Nurse Practitioners) who all work together to provide you with the care you need, when you need it.  We recommend signing up for the patient portal called "MyChart".  Sign up information is provided on this After Visit Summary.  MyChart is used to connect with patients for Virtual Visits (Telemedicine).  Patients are able to view lab/test results, encounter notes, upcoming appointments, etc.  Non-urgent messages can be sent to your provider as well.   To learn more about what you can do with MyChart, go to [email or web address].    Your next appointment:   6 month(s)  The format for your next appointment:   In Person  Provider:   [place] O'Neal, MD      

## 2021-03-03 ENCOUNTER — Telehealth: Payer: Self-pay | Admitting: Physician Assistant

## 2021-03-03 NOTE — Telephone Encounter (Signed)
R/s appts per 5/3 sch msg. Pt aware.  

## 2021-03-09 ENCOUNTER — Other Ambulatory Visit: Payer: Medicare Other

## 2021-03-09 ENCOUNTER — Ambulatory Visit: Payer: Medicare Other | Admitting: Physician Assistant

## 2021-03-10 ENCOUNTER — Telehealth: Payer: Self-pay | Admitting: Physician Assistant

## 2021-03-10 ENCOUNTER — Inpatient Hospital Stay: Payer: Medicare Other | Admitting: Physician Assistant

## 2021-03-10 ENCOUNTER — Other Ambulatory Visit: Payer: Self-pay

## 2021-03-10 ENCOUNTER — Inpatient Hospital Stay: Payer: Medicare Other | Attending: Physician Assistant

## 2021-03-10 VITALS — BP 152/64 | HR 51 | Temp 97.2°F | Resp 17 | Wt 207.9 lb

## 2021-03-10 DIAGNOSIS — Z8546 Personal history of malignant neoplasm of prostate: Secondary | ICD-10-CM | POA: Insufficient documentation

## 2021-03-10 DIAGNOSIS — Z801 Family history of malignant neoplasm of trachea, bronchus and lung: Secondary | ICD-10-CM | POA: Insufficient documentation

## 2021-03-10 DIAGNOSIS — I10 Essential (primary) hypertension: Secondary | ICD-10-CM | POA: Diagnosis not present

## 2021-03-10 DIAGNOSIS — E538 Deficiency of other specified B group vitamins: Secondary | ICD-10-CM | POA: Diagnosis not present

## 2021-03-10 DIAGNOSIS — D509 Iron deficiency anemia, unspecified: Secondary | ICD-10-CM

## 2021-03-10 DIAGNOSIS — D508 Other iron deficiency anemias: Secondary | ICD-10-CM

## 2021-03-10 DIAGNOSIS — Z87891 Personal history of nicotine dependence: Secondary | ICD-10-CM | POA: Diagnosis not present

## 2021-03-10 LAB — FERRITIN: Ferritin: 200 ng/mL (ref 24–336)

## 2021-03-10 LAB — RETIC PANEL
Immature Retic Fract: 12.7 % (ref 2.3–15.9)
RBC.: 4.47 MIL/uL (ref 4.22–5.81)
Retic Count, Absolute: 38 10*3/uL (ref 19.0–186.0)
Retic Ct Pct: 0.9 % (ref 0.4–3.1)
Reticulocyte Hemoglobin: 31.2 pg (ref >27.9)

## 2021-03-10 LAB — CBC WITH DIFFERENTIAL (CANCER CENTER ONLY)
Abs Immature Granulocytes: 0 10*3/uL (ref 0.00–0.07)
Basophils Absolute: 0 10*3/uL (ref 0.0–0.1)
Basophils Relative: 0 %
Eosinophils Absolute: 0.1 10*3/uL (ref 0.0–0.5)
Eosinophils Relative: 2 %
HCT: 36.8 % — ABNORMAL LOW (ref 39.0–52.0)
Hemoglobin: 12.1 g/dL — ABNORMAL LOW (ref 13.0–17.0)
Immature Granulocytes: 0 %
Lymphocytes Relative: 20 %
Lymphs Abs: 0.9 10*3/uL (ref 0.7–4.0)
MCH: 27.4 pg (ref 26.0–34.0)
MCHC: 32.9 g/dL (ref 30.0–36.0)
MCV: 83.4 fL (ref 80.0–100.0)
Monocytes Absolute: 0.6 10*3/uL (ref 0.1–1.0)
Monocytes Relative: 13 %
Neutro Abs: 3 10*3/uL (ref 1.7–7.7)
Neutrophils Relative %: 65 %
Platelet Count: 227 10*3/uL (ref 150–400)
RBC: 4.41 MIL/uL (ref 4.22–5.81)
RDW: 15.4 % (ref 11.5–15.5)
WBC Count: 4.6 10*3/uL (ref 4.0–10.5)
nRBC: 0 % (ref 0.0–0.2)

## 2021-03-10 LAB — CMP (CANCER CENTER ONLY)
ALT: <6 U/L (ref 0–44)
AST: 16 U/L (ref 15–41)
Albumin: 3.2 g/dL — ABNORMAL LOW (ref 3.5–5.0)
Alkaline Phosphatase: 68 U/L (ref 38–126)
Anion gap: 9 (ref 5–15)
BUN: 13 mg/dL (ref 8–23)
CO2: 25 mmol/L (ref 22–32)
Calcium: 9.1 mg/dL (ref 8.9–10.3)
Chloride: 107 mmol/L (ref 98–111)
Creatinine: 1.15 mg/dL (ref 0.61–1.24)
GFR, Estimated: >60 mL/min (ref >60)
Glucose, Bld: 81 mg/dL (ref 70–99)
Potassium: 3.8 mmol/L (ref 3.5–5.1)
Sodium: 141 mmol/L (ref 135–145)
Total Bilirubin: 0.7 mg/dL (ref 0.3–1.2)
Total Protein: 6.8 g/dL (ref 6.5–8.1)

## 2021-03-10 LAB — IRON AND TIBC
Iron: 36 ug/dL — ABNORMAL LOW (ref 42–163)
Saturation Ratios: 17 % — ABNORMAL LOW (ref 20–55)
TIBC: 215 ug/dL (ref 202–409)
UIBC: 179 ug/dL (ref 117–376)

## 2021-03-10 LAB — VITAMIN B12: Vitamin B-12: 424 pg/mL (ref 180–914)

## 2021-03-10 NOTE — Progress Notes (Signed)
[place] [phone]   [phone]  PROGRESS NOTE  Patient Care Team: Kristie Cowman, MD as PCP - General (Family Medicine) O'Neal, Cassie Freer, MD as PCP - Cardiology (Cardiology)  Hematological/Oncological History 1) Labs from [facility]: -5//24/2021: Ferritin 40.40, Folate 14.31, Iron 65 (L), TIBC 450 (H), Vitamin B12 300, WBC 3.6 (L), Hgb 14.8, MCV 91, Plt 163K -04/29/2020: Ferritin 35.20, Folate 10.86,  Iron 73, TBC 413 (H), Vitamin B12 481,  -10/07/2020: Ferritin 26.20, Folate 6.62, Iron 49 (L), TIBC 442 (H), Vitamin B12 361, WBC 4.4, Hgb 12.7, MCV 95, Plt 177K -12/09/2020: Ferritin 17.80 (L), Folate 7.98, Iron 16 (L), TIBC 471 (H), Vitamin B12 226, WBC 3.7 (L), Hgb 7.8 (L), MCV 90, Plt 261K. Hemoccult positive.  -01/05/2021: Ferritin 16.70 (L), Folate 7.98, Iron 9 (L), TIBC 457 (H), Vitamin B12 174 (L), Hgb 9.3, MCV 87, WBC 4.7, Plt 258K  2) 01/14/2021: Establish care with Dede Query PA-C. -Recommended IV feraheme x 2 doses, continue ferrous sulfate 325 mg daily -Recommended vitamin B12 1000 mcg daily  3) Received IV feraheme on 01/20/2021 and 01/28/2021   CHIEF COMPLAINTS/PURPOSE OF CONSULTATION:  "B12 and iron deficiency anemia anemia "  HISTORY OF PRESENTING ILLNESS:  [name] [name] 87 y.o. male returns to the clinic for evaluation for B12 and iron deficiency anemia. He is accompanied by his wife for this visit. Since the last visit on 01/14/2021, patient has received IV feraheme x 2 doses and continues on oral B12 and iron supplementation.   On exam today, [name] [name] reports his energy levels have improved. Two months ago, he had a fall and now has persistent pain in the low back and hips. He notes getting xrays with his PCP and there was no evidence of fractures. He is currently taking tylenol for pain with minimal improvement. He is scheduled to see his PCP later this month to discuss a different pain medication. He ambulates using  a cane but requires a wheelchair for longer distances.  Patient denies any changes to his appetite or exercise routine.  He denies any nausea, vomiting or abdominal pain. He denies easy bruising or signs of bleeding. This includes hematochezia, melena, hematuria, hemoptysis, epistaxis or gingival bleeding. Patient has chronic shortness of breath, mainly with exertion.  He notes to have chronic lower extremity edema that improves with elevation. Patient denies any fevers, chills, night sweats, chest pain or cough. He has no other complaints. Rest of the 10 point ROS is below.   MEDICAL HISTORY:  Past Medical History:  Diagnosis Date  . Hypertension   . Hypothyroidism   . Prostate CA ([place])   . Prostate cancer [facility])     SURGICAL HISTORY: Past Surgical History:  Procedure Laterality Date  . HIP SURGERY      SOCIAL HISTORY: Social History   Socioeconomic History  . Marital status: Married    Spouse name: Not on file  . Number of children: 3  . Years of education: Not on file  . Highest education level: Not on file  Occupational History  . Occupation: retired   Tobacco Use  . Smoking status: Former Smoker    Years: 30.00  . Smokeless tobacco: Never Used  Substance and Sexual Activity  . Alcohol use: Not Currently    Comment: Stopped 30 years ago  . Drug use: Never  . Sexual activity: Not on file  Other Topics Concern  . Not on file  Social History Narrative  . Not on  file   Social Determinants of Health   Financial Resource Strain: Not on file  Food Insecurity: Not on file  Transportation Needs: Not on file  Physical Activity: Not on file  Stress: Not on file  Social Connections: Not on file  Intimate Partner Violence: Not on file    FAMILY HISTORY: Family History  Problem Relation Age of Onset  . Heart disease Mother   . Throat cancer Mother   . Hypertension Father   . Prostate cancer Brother   . Prostate cancer Nephew     ALLERGIES:  is allergic to  sildenafil.  MEDICATIONS:  Current Outpatient Medications  Medication Sig Dispense Refill  . acetaminophen (TYLENOL) 500 MG tablet Take 500 mg by mouth every 6 (six) hours as needed.    . Cholecalciferol (VITAMIN D3) 25 MCG (1000 UT) CAPS Take 1,000 Units by mouth daily.    Marland Kitchen esomeprazole (NEXIUM) 40 MG capsule Take 40 mg by mouth daily.    . ferrous gluconate (FERGON) 324 MG tablet Take 648 mg by mouth daily.    Marland Kitchen levothyroxine (SYNTHROID) 137 MCG tablet Take 137 mcg by mouth daily.    Marland Kitchen lisinopril (ZESTRIL) 5 MG tablet Take 10 mg by mouth daily.     . tamsulosin (FLOMAX) 0.4 MG CAPS capsule Take 0.4 mg by mouth daily.    . traMADol (ULTRAM) 50 MG tablet Take 50 mg by mouth every 4 (four) hours as needed.    . vitamin B-12 (CYANOCOBALAMIN) 1000 MCG tablet Take 1 tablet (1,000 mcg total) by mouth daily. 30 tablet 2  . Vitamin D, Ergocalciferol, (DRISDOL) 1.25 MG (50000 UT) CAPS capsule Take 50,000 Units by mouth once a week.     No current facility-administered medications for this visit.    REVIEW OF SYSTEMS:   Constitutional: ( - ) fevers, ( - )  chills , ( - ) night sweats Eyes: ( - ) blurriness of vision, ( - ) double vision, ( - ) watery eyes Ears, nose, mouth, throat, and face: ( - ) mucositis, ( - ) sore throat Respiratory: ( - ) cough, ( + ) dyspnea on exertion, ( - ) wheezes Cardiovascular: ( - ) palpitation, ( - ) chest discomfort, ( + ) lower extremity swelling  Gastrointestinal:  ( - ) nausea, ( - ) heartburn, ( - ) change in bowel habits Skin: ( - ) abnormal skin rashes Lymphatics: ( - ) new lymphadenopathy, ( - ) easy bruising Neurological: (+ ) numbness, ( - ) tingling, ( - ) new weaknesses Behavioral/Psych: ( - ) mood change, ( - ) new changes  All other systems were reviewed with the patient and are negative.  PHYSICAL EXAMINATION: ECOG PERFORMANCE STATUS: 1 - Symptomatic but completely ambulatory  Vitals:   03/10/21 1029  BP: (!) 152/64  Pulse: (!) 51  Resp:  17  Temp: (!) 97.2 F (36.2 C)  SpO2: 99%   Filed Weights   03/10/21 1029  Weight: 207 lb 14.4 oz (94.3 kg)    GENERAL: well appearing male in NAD  SKIN: skin color, texture, turgor are normal, no rashes or significant lesions EYES: conjunctiva are pink and non-injected, sclera clear OROPHARYNX: no exudate, no erythema; lips, buccal mucosa, and tongue normal  NECK: supple, non-tender LYMPH:  no palpable lymphadenopathy in the cervical, axillary or supraclavicular lymph nodes.  LUNGS: clear to auscultation and percussion with normal breathing effort HEART: regular rate & rhythm and no murmurs. +1 lower extremity edema  ABDOMEN: soft,  non-tender, non-distended, normal bowel sounds Musculoskeletal: no cyanosis of digits and no clubbing  PSYCH: alert & oriented x 3, fluent speech NEURO: no focal motor/sensory deficits   LABORATORY DATA:  I have reviewed the data as listed CBC Latest Ref Rng & Units 03/10/2021 01/14/2021 12/02/2020  WBC 4.0 - 10.5 K/uL 4.6 3.8(L) 3.4  Hemoglobin 13.0 - 17.0 g/dL 12.1(L) 9.0(L) 8.1(L)  Hematocrit 39.0 - 52.0 % 36.8(L) 30.7(L) 25.3(L)  Platelets 150 - 400 K/uL 227 243 246    CMP Latest Ref Rng & Units 03/10/2021 01/14/2021 12/02/2020  Glucose 70 - 99 mg/dL 81 93 93  BUN 8 - 23 mg/dL 13 15 19   Creatinine 0.61 - 1.24 mg/dL 1.15 1.26(H) 1.36(H)  Sodium 135 - 145 mmol/L 141 140 142  Potassium 3.5 - 5.1 mmol/L 3.8 4.1 4.7  Chloride 98 - 111 mmol/L 107 110 109(H)  CO2 22 - 32 mmol/L 25 25 19(L)  Calcium 8.9 - 10.3 mg/dL 9.1 8.9 8.8  Total Protein 6.5 - 8.1 g/dL 6.8 6.9 -  Total Bilirubin 0.3 - 1.2 mg/dL 0.7 1.0 -  Alkaline Phos 38 - 126 U/L 68 67 -  AST 15 - 41 U/L 16 16 -  ALT 0 - 44 U/L <6 8 -   ASSESSMENT & PLAN [name] [name] is a 87 y.o. male presenting to the clinic for evaluation for iron and B12 deficiency anemia.  Patient received feraheme x 2 doses and is currently taking ferrous sulfate 325 mg BID and B12 1000 mcg once daily. Labs from today were  reviewed that shows improvement of anemia. Hemoglobin improved from 9.0  to 12.1 today. Vitamin B12 level is within normal limits. Ferritin levels are back to normal, iron has improved from 18 to 36 and iron saturation improved from 5% to 17%. Recommend to continue with oral iron and B12 supplementation.   #Iron deficiency anemia: --Received IV Feraheme x 2 doses on 01/20/2021 and 01/28/2021.  --Currently taking ferrous sulfate 325 mg daily. Recommend to continue.  --Labs today show improvement with IV and oral iron replacement.Ferritin levels are back to normal, iron has improved from 18 to 36 and iron saturation improved from 5% to 17%. --Patient was evaluated by Dr. Koleen Distance from [place] and underwent colonoscopy with no signs of active bleeding. Consider follow up with GI if anemia worsens in the future --RTC in 3 months with labs.   #B12 deficiency: --B12 level is within normal limits today --Recommend to continue oral B12 1000 mcg PO daily.   Orders Placed This Encounter  Procedures  . CBC with Differential (Cancer Center Only)    Standing Status:   Future    Standing Expiration Date:   03/10/2022  . CMP ([place] only)    Standing Status:   Future    Standing Expiration Date:   03/10/2022  . Iron and TIBC    Standing Status:   Future    Standing Expiration Date:   03/10/2022  . Ferritin    Standing Status:   Future    Standing Expiration Date:   03/10/2022  . Retic Panel    Standing Status:   Future    Standing Expiration Date:   03/10/2022  . Methylmalonic acid, serum    Standing Status:   Future    Standing Expiration Date:   03/10/2022  . Vitamin B12    Standing Status:   Future    Standing Expiration Date:   03/10/2022    All questions were  answered. The patient knows to call the clinic with any problems, questions or concerns.  A total of 25 minutes were spent on this encounter and over half of that time was spent on counseling and coordination of care as  outlined above.    Dede Query, PA-C Department of Hematology/Oncology [place] at [facility] Phone: [phone]

## 2021-03-10 NOTE — Telephone Encounter (Signed)
Scheduled per los. Gave avs and calendar  

## 2021-06-10 ENCOUNTER — Telehealth: Payer: Self-pay | Admitting: Physician Assistant

## 2021-06-10 ENCOUNTER — Inpatient Hospital Stay: Payer: Medicare Other | Admitting: Physician Assistant

## 2021-06-10 ENCOUNTER — Inpatient Hospital Stay: Payer: Medicare Other

## 2021-06-10 NOTE — Telephone Encounter (Signed)
Called pt to r/s appts per 8/10 sch msg. No answer. Left msg for pt to call back to r/s appts.

## 2021-06-11 ENCOUNTER — Telehealth: Payer: Self-pay | Admitting: Physician Assistant

## 2021-06-11 NOTE — Telephone Encounter (Signed)
R/s appt per 8/10 sch msg. Pt aware.

## 2021-06-16 IMAGING — CR DG CHEST 2V
2 series · 2 of 2 positions shown · non-contrast
Comparison: 04/28/2007

CLINICAL DATA: Shortness of breath over the last 4 weeks.

EXAM:
CHEST - 2 VIEW

[w chest pa]
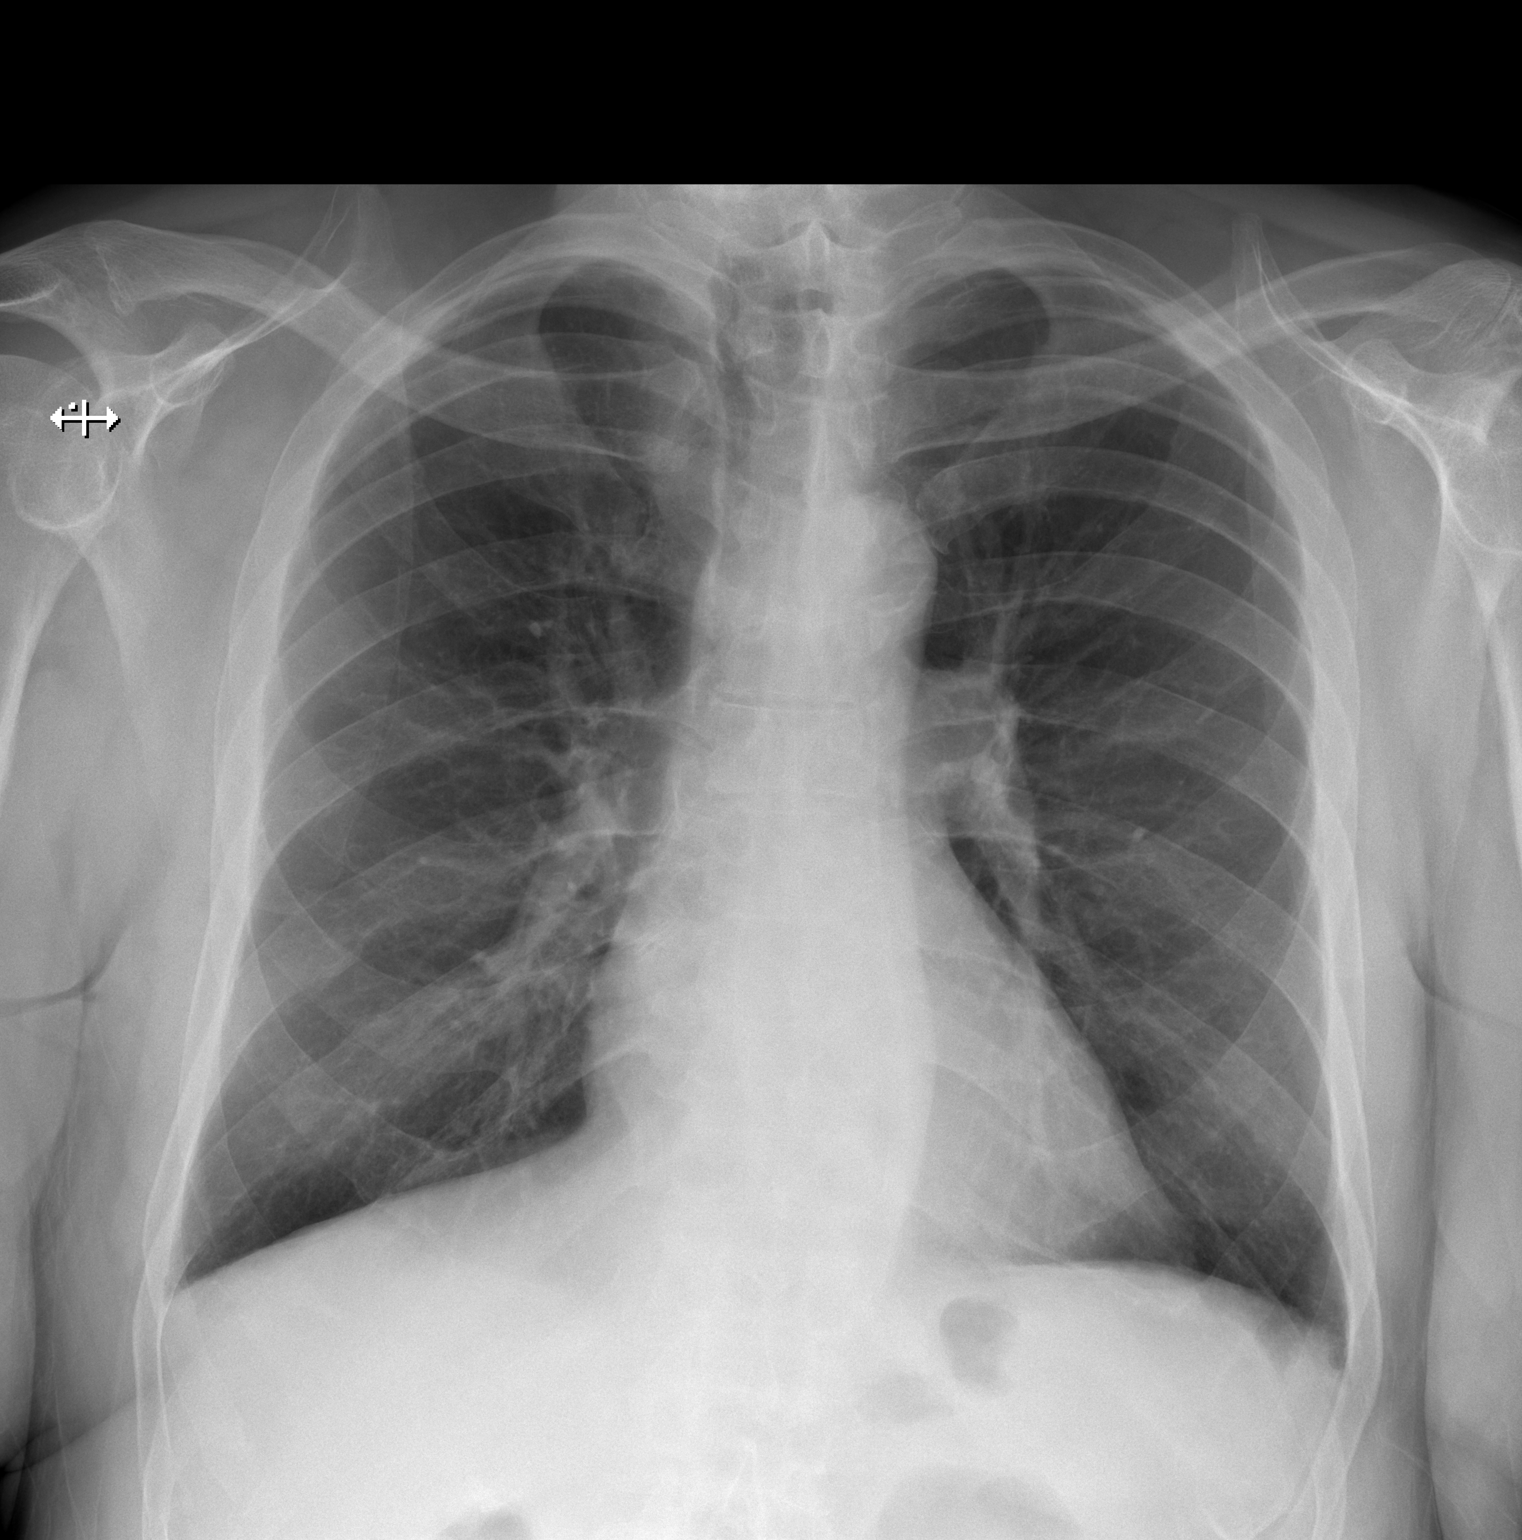

[w chest lat]
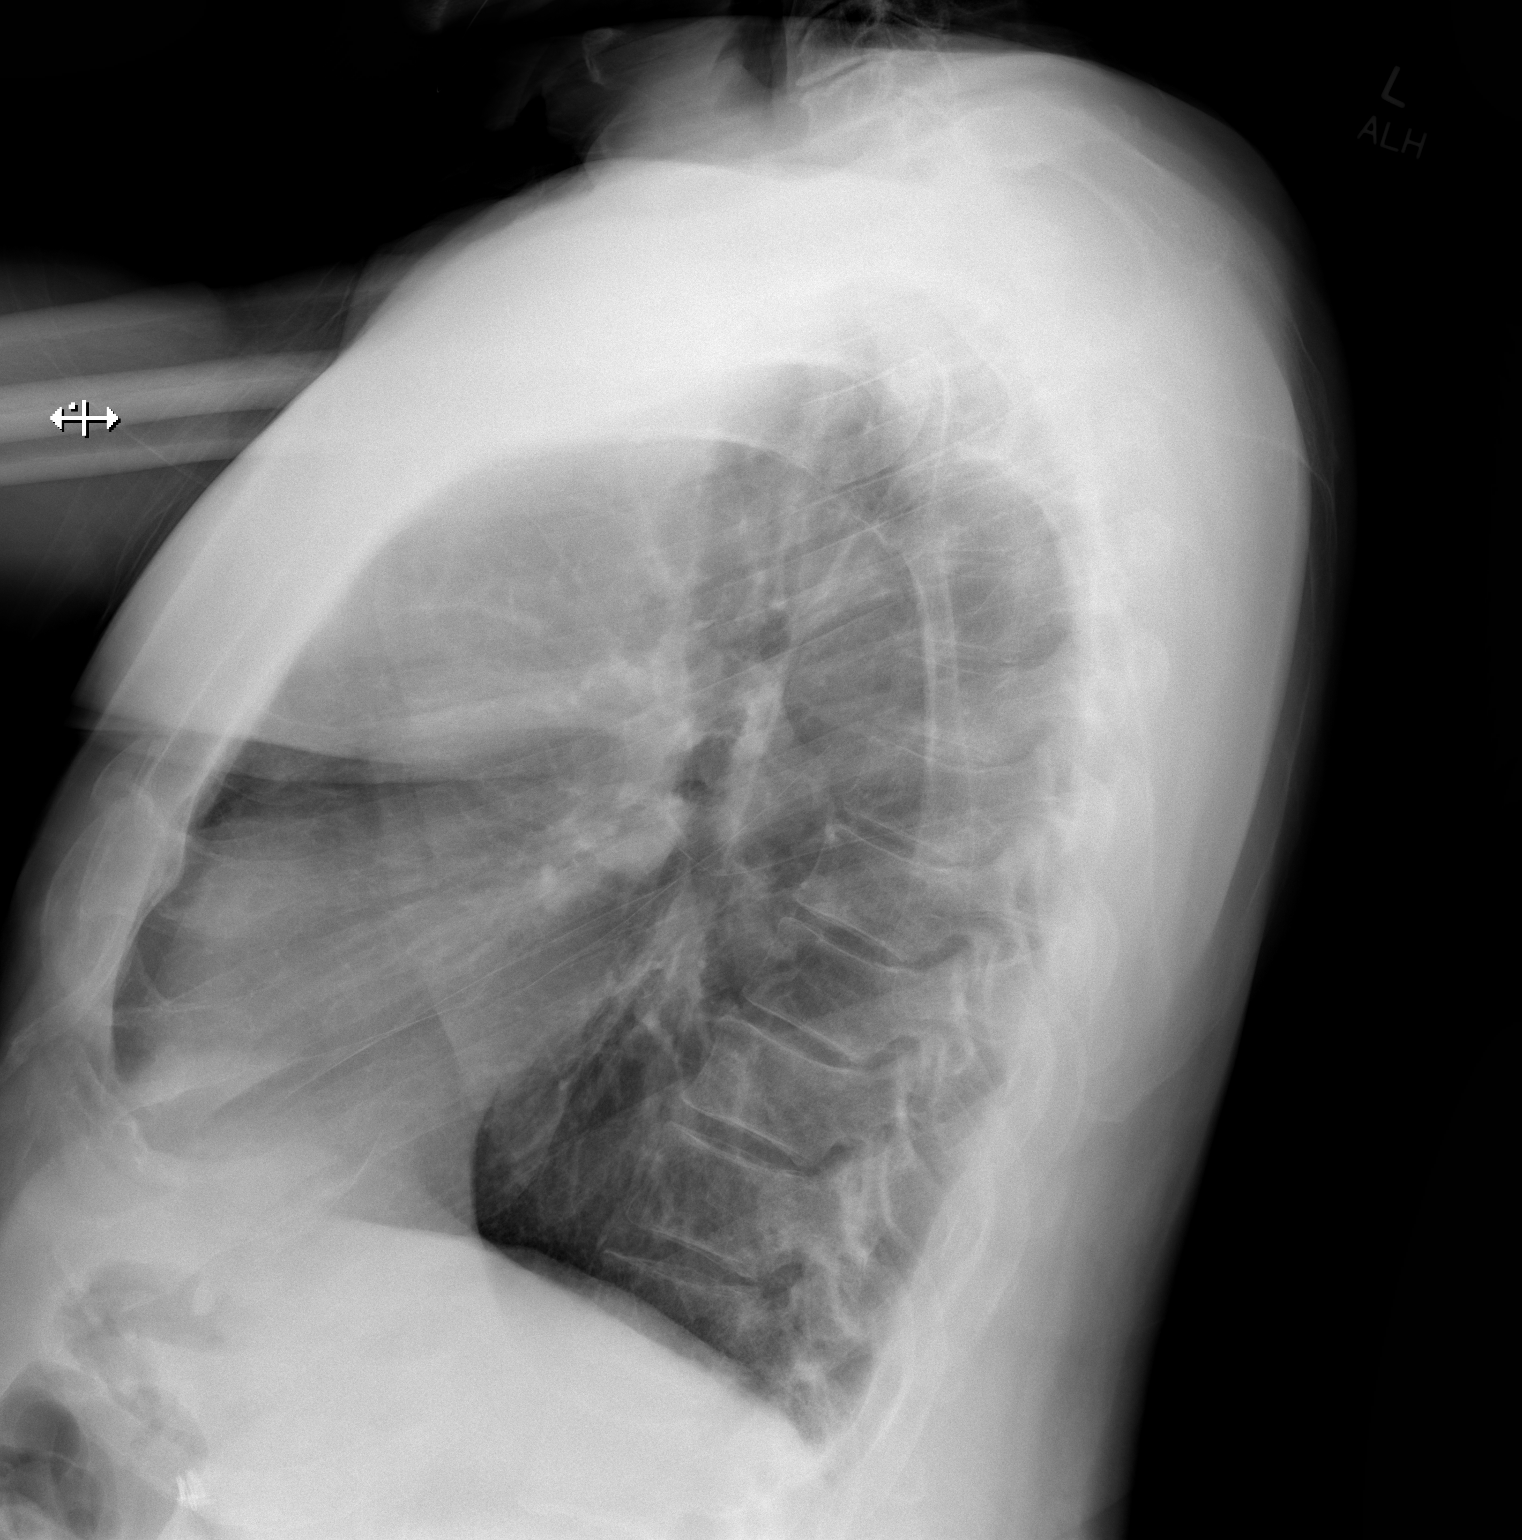

[2 of 2 positions shown; findings below may reference images not displayed]

FINDINGS: Heart size is normal. Ordinary aortic atherosclerosis. Pulmonary
vascularity is normal. The lungs are clear. No bone abnormality.
IMPRESSION: No active disease. Ordinary aortic atherosclerosis.

## 2021-06-18 ENCOUNTER — Inpatient Hospital Stay: Payer: Medicare Other | Attending: Physician Assistant

## 2021-06-18 ENCOUNTER — Inpatient Hospital Stay: Payer: Medicare Other | Admitting: Physician Assistant

## 2021-06-18 ENCOUNTER — Other Ambulatory Visit: Payer: Self-pay

## 2021-06-18 VITALS — BP 156/65 | HR 52 | Temp 97.9°F | Resp 18 | Ht 76.0 in | Wt 190.1 lb

## 2021-06-18 DIAGNOSIS — D509 Iron deficiency anemia, unspecified: Secondary | ICD-10-CM | POA: Insufficient documentation

## 2021-06-18 DIAGNOSIS — E538 Deficiency of other specified B group vitamins: Secondary | ICD-10-CM

## 2021-06-18 DIAGNOSIS — D508 Other iron deficiency anemias: Secondary | ICD-10-CM

## 2021-06-18 LAB — IRON AND TIBC
Iron: 51 ug/dL (ref 42–163)
Saturation Ratios: 22 % (ref 20–55)
TIBC: 228 ug/dL (ref 202–409)
UIBC: 177 ug/dL (ref 117–376)

## 2021-06-18 LAB — CMP (CANCER CENTER ONLY)
ALT: 6 U/L (ref 0–44)
AST: 15 U/L (ref 15–41)
Albumin: 3.3 g/dL — ABNORMAL LOW (ref 3.5–5.0)
Alkaline Phosphatase: 70 U/L (ref 38–126)
Anion gap: 9 (ref 5–15)
BUN: 12 mg/dL (ref 8–23)
CO2: 22 mmol/L (ref 22–32)
Calcium: 9.1 mg/dL (ref 8.9–10.3)
Chloride: 110 mmol/L (ref 98–111)
Creatinine: 1 mg/dL (ref 0.61–1.24)
GFR, Estimated: >60 mL/min (ref >60)
Glucose, Bld: 93 mg/dL (ref 70–99)
Potassium: 3.8 mmol/L (ref 3.5–5.1)
Sodium: 141 mmol/L (ref 135–145)
Total Bilirubin: 1.1 mg/dL (ref 0.3–1.2)
Total Protein: 6.7 g/dL (ref 6.5–8.1)

## 2021-06-18 LAB — RETIC PANEL
Immature Retic Fract: 14.7 % (ref 2.3–15.9)
RBC.: 4.24 MIL/uL (ref 4.22–5.81)
Retic Count, Absolute: 58.1 10*3/uL (ref 19.0–186.0)
Retic Ct Pct: 1.4 % (ref 0.4–3.1)
Reticulocyte Hemoglobin: 32.1 pg (ref >27.9)

## 2021-06-18 LAB — CBC WITH DIFFERENTIAL (CANCER CENTER ONLY)
Abs Immature Granulocytes: 0.01 10*3/uL (ref 0.00–0.07)
Basophils Absolute: 0 10*3/uL (ref 0.0–0.1)
Basophils Relative: 1 %
Eosinophils Absolute: 0.1 10*3/uL (ref 0.0–0.5)
Eosinophils Relative: 2 %
HCT: 36.8 % — ABNORMAL LOW (ref 39.0–52.0)
Hemoglobin: 12.2 g/dL — ABNORMAL LOW (ref 13.0–17.0)
Immature Granulocytes: 0 %
Lymphocytes Relative: 25 %
Lymphs Abs: 1.1 10*3/uL (ref 0.7–4.0)
MCH: 28.9 pg (ref 26.0–34.0)
MCHC: 33.2 g/dL (ref 30.0–36.0)
MCV: 87.2 fL (ref 80.0–100.0)
Monocytes Absolute: 0.5 10*3/uL (ref 0.1–1.0)
Monocytes Relative: 11 %
Neutro Abs: 2.7 10*3/uL (ref 1.7–7.7)
Neutrophils Relative %: 61 %
Platelet Count: 198 10*3/uL (ref 150–400)
RBC: 4.22 MIL/uL (ref 4.22–5.81)
RDW: 13.2 % (ref 11.5–15.5)
WBC Count: 4.4 10*3/uL (ref 4.0–10.5)
nRBC: 0 % (ref 0.0–0.2)

## 2021-06-18 LAB — VITAMIN B12: Vitamin B-12: 244 pg/mL (ref 180–914)

## 2021-06-18 LAB — FERRITIN: Ferritin: 159 ng/mL (ref 24–336)

## 2021-06-18 NOTE — Progress Notes (Signed)
[place] Telephone:([phone]) [phone]   [phone]  PROGRESS NOTE  Patient Care Team: Kristie Cowman, MD as PCP - General (Family Medicine) O'Neal, Cassie Freer, MD as PCP - Cardiology (Cardiology)  Hematological/Oncological History 1) Labs from [facility]: -5//24/2021: Ferritin 40.40, Folate 14.31, Iron 65 (L), TIBC 450 (H), Vitamin B12 300, WBC 3.6 (L), Hgb 14.8, MCV 91, Plt 163K -04/29/2020: Ferritin 35.20, Folate 10.86,  Iron 73, TBC 413 (H), Vitamin B12 481,  -10/07/2020: Ferritin 26.20, Folate 6.62, Iron 49 (L), TIBC 442 (H), Vitamin B12 361, WBC 4.4, Hgb 12.7, MCV 95, Plt 177K -12/09/2020: Ferritin 17.80 (L), Folate 7.98, Iron 16 (L), TIBC 471 (H), Vitamin B12 226, WBC 3.7 (L), Hgb 7.8 (L), MCV 90, Plt 261K. Hemoccult positive.  -01/05/2021: Ferritin 16.70 (L), Folate 7.98, Iron 9 (L), TIBC 457 (H), Vitamin B12 174 (L), Hgb 9.3, MCV 87, WBC 4.7, Plt 258K  2) 01/14/2021: Establish care with Dede Query PA-C. -Recommended IV feraheme x 2 doses, continue ferrous sulfate 325 mg daily -Recommended vitamin B12 1000 mcg daily  3) Received IV feraheme on 01/20/2021 and 01/28/2021   CHIEF COMPLAINTS/PURPOSE OF CONSULTATION:  "B12 and iron deficiency anemia anemia "  HISTORY OF PRESENTING ILLNESS:  [name] [name] 87 y.o. male returns to the clinic for evaluation for B12 and iron deficiency anemia. He is accompanied by his wife for this visit.   On exam today, [name] [name] reports his energy levels are overall stable. Patient continues to have chronic pain involving his back, neck, shoulders and hips since his fall in February 2022. He is currently taking tylenol for pain with minimal improvement. He is scheduled for a consultation with a pain clinic to further assist with pain management. His appetite is fair with weight loss. He denies any nausea, vomiting or abdominal pain. His bowel movements are regular without any diarrhea or constipation. He  Two months ago, he  had a fall and now has persistent pain in the low back and hips. He notes getting xrays with his PCP and there was no evidence of fractures. He is currently taking tylenol for pain with minimal improvement. He is scheduled to see his PCP later this month to discuss a different pain medication. He ambulates using a cane but requires a wheelchair for longer distances.  Patient denies any changes to his appetite or exercise routine.  He denies any nausea, vomiting or abdominal pain. He denies easy bruising or signs of bleeding. This includes hematochezia, melena, hematuria, hemoptysis, epistaxis or gingival bleeding. Patient has chronic shortness of breath, mainly with exertion.  He notes to have chronic lower extremity edema that improves with elevation. Patient denies any fevers, chills, night sweats, chest pain or cough. He has no other complaints. Rest of the 10 point ROS is below.   MEDICAL HISTORY:  Past Medical History:  Diagnosis Date   Hypertension    Hypothyroidism    Prostate CA ([place])    Prostate cancer ([place])     SURGICAL HISTORY: Past Surgical History:  Procedure Laterality Date   HIP SURGERY      SOCIAL HISTORY: Social History   Socioeconomic History   Marital status: Married    Spouse name: Not on file   Number of children: 3   Years of education: Not on file   Highest education level: Not on file  Occupational History   Occupation: retired   Tobacco Use   Smoking status: Former    Years: 30.00    Types:  Cigarettes   Smokeless tobacco: Never  Substance and Sexual Activity   Alcohol use: Not Currently    Comment: Stopped 30 years ago   Drug use: Never   Sexual activity: Not on file  Other Topics Concern   Not on file  Social History Narrative   Not on file   Social Determinants of Health   Financial Resource Strain: Not on file  Food Insecurity: Not on file  Transportation Needs: Not on file  Physical Activity: Not on file  Stress: Not on file  Social  Connections: Not on file  Intimate Partner Violence: Not on file    FAMILY HISTORY: Family History  Problem Relation Age of Onset   Heart disease Mother    Throat cancer Mother    Hypertension Father    Prostate cancer Brother    Prostate cancer Nephew     ALLERGIES:  is allergic to sildenafil.  MEDICATIONS:  Current Outpatient Medications  Medication Sig Dispense Refill   acetaminophen (TYLENOL) 500 MG tablet Take 500 mg by mouth every 6 (six) hours as needed.     Cholecalciferol (VITAMIN D3) 25 MCG (1000 UT) CAPS Take 1,000 Units by mouth daily.     esomeprazole (NEXIUM) 40 MG capsule Take 40 mg by mouth daily.     ferrous gluconate (FERGON) 324 MG tablet Take 648 mg by mouth daily.     levothyroxine (SYNTHROID) 137 MCG tablet Take 137 mcg by mouth daily.     lisinopril (ZESTRIL) 5 MG tablet Take 10 mg by mouth daily.      tamsulosin (FLOMAX) 0.4 MG CAPS capsule Take 0.4 mg by mouth daily.     traMADol (ULTRAM) 50 MG tablet Take 50 mg by mouth every 4 (four) hours as needed.     vitamin B-12 (CYANOCOBALAMIN) 1000 MCG tablet Take 1 tablet (1,000 mcg total) by mouth daily. 30 tablet 2   Vitamin D, Ergocalciferol, (DRISDOL) 1.25 MG (50000 UT) CAPS capsule Take 50,000 Units by mouth once a week.     No current facility-administered medications for this visit.    REVIEW OF SYSTEMS:   Constitutional: ( - ) fevers, ( - )  chills , ( - ) night sweats Eyes: ( - ) blurriness of vision, ( - ) double vision, ( - ) watery eyes Ears, nose, mouth, throat, and face: ( - ) mucositis, ( - ) sore throat Respiratory: ( - ) cough, ( + ) dyspnea on exertion, ( - ) wheezes Cardiovascular: ( - ) palpitation, ( - ) chest discomfort, ( + ) lower extremity swelling  Gastrointestinal:  ( - ) nausea, ( - ) heartburn, ( - ) change in bowel habits Skin: ( - ) abnormal skin rashes Lymphatics: ( - ) new lymphadenopathy, ( - ) easy bruising Neurological: (+ ) numbness, ( - ) tingling, ( - ) new  weaknesses Behavioral/Psych: ( - ) mood change, ( - ) new changes  All other systems were reviewed with the patient and are negative.  PHYSICAL EXAMINATION: ECOG PERFORMANCE STATUS: 1 - Symptomatic but completely ambulatory  Vitals:   06/18/21 0833  BP: (!) 156/65  Pulse: (!) 52  Resp: 18  Temp: 97.9 F (36.6 C)  SpO2: 100%   Filed Weights   06/18/21 0833  Weight: 190 lb 1.6 oz (86.2 kg)    GENERAL: well appearing male in NAD  SKIN: skin color, texture, turgor are normal, no rashes or significant lesions EYES: conjunctiva are pink and non-injected, sclera clear OROPHARYNX:  no exudate, no erythema; lips, buccal mucosa, and tongue normal  NECK: supple, non-tender LYMPH:  no palpable lymphadenopathy in the cervical, axillary or supraclavicular lymph nodes.  LUNGS: clear to auscultation and percussion with normal breathing effort HEART: regular rate & rhythm and no murmurs. +1 lower extremity edema (R >L) ABDOMEN: soft, non-tender, non-distended, normal bowel sounds Musculoskeletal: no cyanosis of digits and no clubbing  PSYCH: alert & oriented x 3, fluent speech NEURO: no focal motor/sensory deficits   LABORATORY DATA:  I have reviewed the data as listed CBC Latest Ref Rng & Units 06/18/2021 03/10/2021 01/14/2021  WBC 4.0 - 10.5 K/uL 4.4 4.6 3.8(L)  Hemoglobin 13.0 - 17.0 g/dL 12.2(L) 12.1(L) 9.0(L)  Hematocrit 39.0 - 52.0 % 36.8(L) 36.8(L) 30.7(L)  Platelets 150 - 400 K/uL 198 227 243    CMP Latest Ref Rng & Units 06/18/2021 03/10/2021 01/14/2021  Glucose 70 - 99 mg/dL 93 81 93  BUN 8 - 23 mg/dL '12 13 15  '$ Creatinine 0.61 - 1.24 mg/dL 1.00 1.15 1.26(H)  Sodium 135 - 145 mmol/L 141 141 140  Potassium 3.5 - 5.1 mmol/L 3.8 3.8 4.1  Chloride 98 - 111 mmol/L 110 107 110  CO2 22 - 32 mmol/L '22 25 25  '$ Calcium 8.9 - 10.3 mg/dL 9.1 9.1 8.9  Total Protein 6.5 - 8.1 g/dL 6.7 6.8 6.9  Total Bilirubin 0.3 - 1.2 mg/dL 1.1 0.7 1.0  Alkaline Phos 38 - 126 U/L 70 68 67  AST 15 - 41 U/L  '15 16 16  '$ ALT 0 - 44 U/L 6 <6 8   ASSESSMENT & PLAN [name] [name] is a 87 y.o. male presenting to the clinic for evaluation for iron and B12 deficiency anemia.   #Iron deficiency anemia: --Received IV Feraheme x 2 doses on 01/20/2021 and 01/28/2021.  --Currently taking ferrous sulfate 325 mg daily. Recommend to continue.  --Labs today show stable Hgb at 12.2, Ferritin is normal at 159, iron saturation improved from 17% to 22%, Iron improved from 36 to 51.  --Patient was evaluated by Dr. Koleen Distance from [place] and underwent colonoscopy with no signs of active bleeding. Consider follow up with GI if anemia worsens in the future --RTC in 6 months with labs.   #B12 deficiency: --B12 level is 244.  --Recommend to increase B12 1000 mcg PO to twice a day.   No orders of the defined types were placed in this encounter.   All questions were answered. The patient knows to call the clinic with any problems, questions or concerns.  I have spent a total of 25 minutes minutes of face-to-face and non-face-to-face time, preparing to see the patient, obtaining and/or reviewing separately obtained history, performing a medically appropriate examination, counseling and educating the patient, documenting clinical information in the electronic health record, and care coordination.     Dede Query, PA-C Department of Hematology/Oncology [place] [facility] Phone: [phone]

## 2021-06-20 LAB — METHYLMALONIC ACID, SERUM: Methylmalonic Acid, Quantitative: 168 nmol/L (ref 0–378)

## 2021-09-27 NOTE — Progress Notes (Deleted)
Cardiology Office Note:   Date:  09/27/2021  NAME:  [name] [name]    MRN: [id number] DOB:  08-Jun-1932   PCP:  Kristie Cowman, MD  Cardiologist:  Evalina Field, MD  Electrophysiologist:  None   Referring MD: Kristie Cowman, MD   No chief complaint on file. ***  History of Present Illness:   [name] [name] is a 87 y.o. male with a hx of HTN, CKD, HFpEF, Trifascicular block, IDA who presents for follow-up.   Problem List 1. HFpEF -EF 55-60%, elevated BNP -Normal MPI 11/2019 2.  Trifascicular block -First-degree AV block, 240 ms, right bundle branch block, left anterior fascicular block -ETT with increase in HR to 81% MPHR 02/06/2020 3. HTN 4. CKD 3 5. Diabetes  6. Iron-deficiency anemia 7. B12 deficiency   Past Medical History: Past Medical History:  Diagnosis Date   Hypertension    Hypothyroidism    Prostate CA ([place])    Prostate cancer [facility])     Past Surgical History: Past Surgical History:  Procedure Laterality Date   HIP SURGERY      Current Medications: No outpatient medications have been marked as taking for the 09/28/21 encounter (Appointment) with O'Neal, Cassie Freer, MD.     Allergies:    Sildenafil   Social History: Social History   Socioeconomic History   Marital status: Married    Spouse name: Not on file   Number of children: 3   Years of education: Not on file   Highest education level: Not on file  Occupational History   Occupation: retired   Tobacco Use   Smoking status: Former    Years: 30.00    Types: Cigarettes   Smokeless tobacco: Never  Substance and Sexual Activity   Alcohol use: Not Currently    Comment: Stopped 30 years ago   Drug use: Never   Sexual activity: Not on file  Other Topics Concern   Not on file  Social History Narrative   Not on file   Social Determinants of Health   Financial Resource Strain: Not on file  Food Insecurity: Not on file  Transportation Needs: Not on file  Physical Activity: Not on file   Stress: Not on file  Social Connections: Not on file     Family History: The patient's ***family history includes Heart disease in his mother; Hypertension in his father; Prostate cancer in his brother and nephew; Throat cancer in his mother.  ROS:   All other ROS reviewed and negative. Pertinent positives noted in the HPI.     EKGs/Labs/Other Studies Reviewed:   The following studies were personally reviewed by me today:  EKG:  EKG is *** ordered today.  The ekg ordered today demonstrates ***, and was personally reviewed by me.   TTE 11/15/2019  1. Left ventricular ejection fraction, by visual estimation, is 55 to  60%. The left ventricle has normal function. There is mildly increased  left ventricular hypertrophy.   2. Left ventricular diastolic parameters are indeterminate.   3. The left ventricle has no regional wall motion abnormalities.   4. Global right ventricle has normal systolic function.The right  ventricular size is normal. No increase in right ventricular wall  thickness.   5. Left atrial size was normal.   6. Right atrial size was normal.   7. The mitral valve is normal in structure. Trivial mitral valve  regurgitation.   8. The tricuspid valve is normal in structure.   9. The aortic valve is  tricuspid. Aortic valve regurgitation is not  visualized. Mild aortic valve sclerosis without stenosis.  10. Pulmonic regurgitation is mild.  11. The pulmonic valve was normal in structure. Pulmonic valve  regurgitation is mild.  12. Normal pulmonary artery systolic pressure.  13. The inferior vena cava is dilated in size with >50% respiratory  variability, suggesting right atrial pressure of 8 mmHg.   [place] 11/27/2019 1. Sinus bradycardia with intermittent Wenckebach.  2. Baseline 1AVB noted. 3. 9 sinus pauses in setting of Wenckebach up to 3.4 seconds. No symptoms reported.  4. No atrial fibrillation.   Recent Labs: 12/02/2020: BNP 237.3; TSH 1.790 06/18/2021: ALT 6; BUN  12; Creatinine 1.00; Hemoglobin 12.2; Platelet Count 198; Potassium 3.8; Sodium 141   Recent Lipid Panel    Component Value Date/Time   CHOL 157 08/25/2009 0000   TRIG 107.0 08/25/2009 0000   HDL 48.00 08/25/2009 0000   CHOLHDL 3 08/25/2009 0000   VLDL 21.4 08/25/2009 0000   LDLCALC 88 08/25/2009 0000    Physical Exam:   VS:  There were no vitals taken for this visit.   Wt Readings from Last 3 Encounters:  06/18/21 190 lb 1.6 oz (86.2 kg)  03/10/21 207 lb 14.4 oz (94.3 kg)  03/02/21 206 lb 12.8 oz (93.8 kg)    General: Well nourished, well developed, in no acute distress Head: Atraumatic, normal size  Eyes: PEERLA, EOMI  Neck: Supple, no JVD Endocrine: No thryomegaly Cardiac: Normal S1, S2; RRR; no murmurs, rubs, or gallops Lungs: Clear to auscultation bilaterally, no wheezing, rhonchi or rales  Abd: Soft, nontender, no hepatomegaly  Ext: No edema, pulses 2+ Musculoskeletal: No deformities, BUE and BLE strength normal and equal Skin: Warm and dry, no rashes   Neuro: Alert and oriented to person, place, time, and situation, CNII-XII grossly intact, no focal deficits  Psych: Normal mood and affect   ASSESSMENT:   [name] [name] is a 87 y.o. male who presents for the following: No diagnosis found.  PLAN:   There are no diagnoses linked to this encounter.  {Are you ordering a CV Procedure (e.g. stress test, cath, DCCV, TEE, etc)?   Press F2        :[id number]}  Disposition: No follow-ups on file.  Medication Adjustments/Labs and Tests Ordered: Current medicines are reviewed at length with the patient today.  Concerns regarding medicines are outlined above.  No orders of the defined types were placed in this encounter.  No orders of the defined types were placed in this encounter.   There are no Patient Instructions on file for this visit.   Time Spent with Patient: I have spent a total of *** minutes with patient reviewing hospital notes, telemetry, EKGs, labs and  examining the patient as well as establishing an assessment and plan that was discussed with the patient.  > 50% of time was spent in direct patient care.  Signed, Addison Naegeli. Audie Box, MD, [place]  [address], [place], [place] [address] [phone]  09/27/2021 7:59 PM

## 2021-09-28 ENCOUNTER — Ambulatory Visit: Payer: Medicare Other | Admitting: Cardiovascular Disease

## 2021-12-15 ENCOUNTER — Other Ambulatory Visit: Payer: Self-pay | Admitting: Physician Assistant

## 2021-12-15 DIAGNOSIS — D508 Other iron deficiency anemias: Secondary | ICD-10-CM

## 2021-12-15 DIAGNOSIS — E538 Deficiency of other specified B group vitamins: Secondary | ICD-10-CM

## 2021-12-16 ENCOUNTER — Inpatient Hospital Stay: Payer: Medicare Other | Attending: Physician Assistant | Admitting: Physician Assistant

## 2021-12-16 ENCOUNTER — Inpatient Hospital Stay: Payer: Medicare Other

## 2022-11-17 ENCOUNTER — Emergency Department (HOSPITAL_COMMUNITY): Payer: Medicare Other

## 2022-11-17 ENCOUNTER — Encounter (HOSPITAL_COMMUNITY): Payer: Self-pay | Admitting: Infectious Diseases

## 2022-11-17 ENCOUNTER — Observation Stay (HOSPITAL_COMMUNITY)
Admission: EM | Admit: 2022-11-17 | Discharge: 2022-11-18 | Disposition: A | Discharge status: Home / Self Care | Payer: Medicare Other | Attending: Infectious Diseases | Admitting: Infectious Diseases

## 2022-11-17 ENCOUNTER — Other Ambulatory Visit: Payer: Self-pay

## 2022-11-17 DIAGNOSIS — Z79899 Other long term (current) drug therapy: Secondary | ICD-10-CM | POA: Diagnosis not present

## 2022-11-17 DIAGNOSIS — W01198A Fall on same level from slipping, tripping and stumbling with subsequent striking against other object, initial encounter: Secondary | ICD-10-CM | POA: Insufficient documentation

## 2022-11-17 DIAGNOSIS — I130 Hypertensive heart and chronic kidney disease with heart failure and stage 1 through stage 4 chronic kidney disease, or unspecified chronic kidney disease: Secondary | ICD-10-CM | POA: Diagnosis not present

## 2022-11-17 DIAGNOSIS — E1122 Type 2 diabetes mellitus with diabetic chronic kidney disease: Secondary | ICD-10-CM | POA: Diagnosis not present

## 2022-11-17 DIAGNOSIS — N1831 Chronic kidney disease, stage 3a: Secondary | ICD-10-CM | POA: Insufficient documentation

## 2022-11-17 DIAGNOSIS — I4891 Unspecified atrial fibrillation: Secondary | ICD-10-CM | POA: Diagnosis not present

## 2022-11-17 DIAGNOSIS — S098XXA Other specified injuries of head, initial encounter: Principal | ICD-10-CM | POA: Insufficient documentation

## 2022-11-17 DIAGNOSIS — Z8546 Personal history of malignant neoplasm of prostate: Secondary | ICD-10-CM | POA: Diagnosis not present

## 2022-11-17 DIAGNOSIS — Z96641 Presence of right artificial hip joint: Secondary | ICD-10-CM | POA: Diagnosis not present

## 2022-11-17 DIAGNOSIS — U071 COVID-19: Secondary | ICD-10-CM

## 2022-11-17 DIAGNOSIS — W19XXXA Unspecified fall, initial encounter: Secondary | ICD-10-CM

## 2022-11-17 DIAGNOSIS — R296 Repeated falls: Secondary | ICD-10-CM

## 2022-11-17 DIAGNOSIS — E039 Hypothyroidism, unspecified: Secondary | ICD-10-CM | POA: Diagnosis not present

## 2022-11-17 DIAGNOSIS — I5032 Chronic diastolic (congestive) heart failure: Secondary | ICD-10-CM | POA: Diagnosis not present

## 2022-11-17 HISTORY — DX: Repeated falls: R29.6

## 2022-11-17 HISTORY — DX: Unspecified fall, initial encounter: W19.XXXA

## 2022-11-17 HISTORY — DX: Trifascicular block: I45.3

## 2022-11-17 HISTORY — DX: Deficiency of other specified B group vitamins: E53.8

## 2022-11-17 HISTORY — DX: Iron deficiency anemia, unspecified: D50.9

## 2022-11-17 HISTORY — DX: Chronic diastolic (congestive) heart failure: I50.32

## 2022-11-17 LAB — COMPREHENSIVE METABOLIC PANEL
ALT: 19 U/L (ref 0–44)
AST: 32 U/L (ref 15–41)
Albumin: 3.1 g/dL — ABNORMAL LOW (ref 3.5–5.0)
Alkaline Phosphatase: 50 U/L (ref 38–126)
Anion gap: 11 (ref 5–15)
BUN: 13 mg/dL (ref 8–23)
CO2: 19 mmol/L — ABNORMAL LOW (ref 22–32)
Calcium: 8.3 mg/dL — ABNORMAL LOW (ref 8.9–10.3)
Chloride: 108 mmol/L (ref 98–111)
Creatinine, Ser: 1.27 mg/dL — ABNORMAL HIGH (ref 0.61–1.24)
GFR, Estimated: 54 mL/min — ABNORMAL LOW (ref >60)
Glucose, Bld: 100 mg/dL — ABNORMAL HIGH (ref 70–99)
Potassium: 3.5 mmol/L (ref 3.5–5.1)
Sodium: 138 mmol/L (ref 135–145)
Total Bilirubin: 2 mg/dL — ABNORMAL HIGH (ref 0.3–1.2)
Total Protein: 5.9 g/dL — ABNORMAL LOW (ref 6.5–8.1)

## 2022-11-17 LAB — CBC WITH DIFFERENTIAL/PLATELET
Abs Immature Granulocytes: 0.01 10*3/uL (ref 0.00–0.07)
Basophils Absolute: 0 10*3/uL (ref 0.0–0.1)
Basophils Relative: 0 %
Eosinophils Absolute: 0 10*3/uL (ref 0.0–0.5)
Eosinophils Relative: 0 %
HCT: 41.4 % (ref 39.0–52.0)
Hemoglobin: 14.1 g/dL (ref 13.0–17.0)
Immature Granulocytes: 0 %
Lymphocytes Relative: 15 %
Lymphs Abs: 0.7 10*3/uL (ref 0.7–4.0)
MCH: 29.9 pg (ref 26.0–34.0)
MCHC: 34.1 g/dL (ref 30.0–36.0)
MCV: 87.9 fL (ref 80.0–100.0)
Monocytes Absolute: 0.8 10*3/uL (ref 0.1–1.0)
Monocytes Relative: 18 %
Neutro Abs: 2.8 10*3/uL (ref 1.7–7.7)
Neutrophils Relative %: 67 %
Platelets: 122 10*3/uL — ABNORMAL LOW (ref 150–400)
RBC: 4.71 MIL/uL (ref 4.22–5.81)
RDW: 13.2 % (ref 11.5–15.5)
WBC: 4.2 10*3/uL (ref 4.0–10.5)
nRBC: 0 % (ref 0.0–0.2)

## 2022-11-17 LAB — I-STAT CHEM 8, ED
BUN: 13 mg/dL (ref 8–23)
Calcium, Ion: 1.14 mmol/L — ABNORMAL LOW (ref 1.15–1.40)
Chloride: 106 mmol/L (ref 98–111)
Creatinine, Ser: 1.2 mg/dL (ref 0.61–1.24)
Glucose, Bld: 100 mg/dL — ABNORMAL HIGH (ref 70–99)
HCT: 41 % (ref 39.0–52.0)
Hemoglobin: 13.9 g/dL (ref 13.0–17.0)
Potassium: 3.5 mmol/L (ref 3.5–5.1)
Sodium: 139 mmol/L (ref 135–145)
TCO2: 19 mmol/L — ABNORMAL LOW (ref 22–32)

## 2022-11-17 LAB — VITAMIN B12: Vitamin B-12: 525 pg/mL (ref 180–914)

## 2022-11-17 LAB — URINALYSIS, ROUTINE W REFLEX MICROSCOPIC
Bacteria, UA: NONE SEEN
Bilirubin Urine: NEGATIVE
Glucose, UA: NEGATIVE mg/dL
Ketones, ur: 20 mg/dL — AB
Leukocytes,Ua: NEGATIVE
Nitrite: NEGATIVE
Protein, ur: NEGATIVE mg/dL
Specific Gravity, Urine: 1.009 (ref 1.005–1.030)
pH: 5 (ref 5.0–8.0)

## 2022-11-17 LAB — RESP PANEL BY RT-PCR (RSV, FLU A&B, COVID)  RVPGX2
Influenza A by PCR: NEGATIVE
Influenza B by PCR: NEGATIVE
Resp Syncytial Virus by PCR: NEGATIVE
SARS Coronavirus 2 by RT PCR: POSITIVE — AB

## 2022-11-17 LAB — PROTIME-INR
INR: 1.4 — ABNORMAL HIGH (ref 0.8–1.2)
Prothrombin Time: 16.8 seconds — ABNORMAL HIGH (ref 11.4–15.2)

## 2022-11-17 LAB — LACTIC ACID, PLASMA
Lactic Acid, Venous: 1.2 mmol/L (ref 0.5–1.9)
Lactic Acid, Venous: 2.1 mmol/L (ref 0.5–1.9)
Lactic Acid, Venous: 2.3 mmol/L (ref 0.5–1.9)
Lactic Acid, Venous: 2.1 mmol/L (ref 0.5–1.9)

## 2022-11-17 LAB — APTT: aPTT: 35 seconds (ref 24–36)

## 2022-11-17 LAB — PHOSPHORUS: Phosphorus: 2.9 mg/dL (ref 2.5–4.6)

## 2022-11-17 LAB — MAGNESIUM: Magnesium: 1.9 mg/dL (ref 1.7–2.4)

## 2022-11-17 MED ORDER — NIRMATRELVIR/RITONAVIR (PAXLOVID) TABLET (RENAL DOSING)
2.0000 | ORAL_TABLET | Freq: Two times a day (BID) | ORAL | Status: DC
  Administered 2022-11-17 – 2022-11-18 (×3): 2 via ORAL
  Filled 2022-11-17: qty 20

## 2022-11-17 MED ORDER — LISINOPRIL 10 MG PO TABS: 10.0000 mg | ORAL_TABLET | Freq: Every day | ORAL | Status: DC

## 2022-11-17 MED ORDER — LISINOPRIL 10 MG PO TABS
10.0000 mg | ORAL_TABLET | Freq: Every day | ORAL | Status: DC
  Administered 2022-11-18: 10 mg via ORAL
  Filled 2022-11-17: qty 1

## 2022-11-17 MED ORDER — ACETAMINOPHEN 325 MG PO TABS: 650.0000 mg | ORAL_TABLET | Freq: Four times a day (QID) | ORAL | Status: DC | PRN

## 2022-11-17 MED ORDER — LEVOTHYROXINE SODIUM 25 MCG PO TABS
150.0000 ug | ORAL_TABLET | Freq: Every day | ORAL | Status: DC
  Administered 2022-11-17 – 2022-11-18 (×2): 137 ug via ORAL
  Filled 2022-11-17 (×2): qty 1

## 2022-11-17 MED ORDER — TAMSULOSIN HCL 0.4 MG PO CAPS
0.4000 mg | ORAL_CAPSULE | Freq: Every day | ORAL | Status: DC
  Administered 2022-11-17: 0.4 mg via ORAL
  Filled 2022-11-17: qty 1

## 2022-11-17 MED ORDER — ENOXAPARIN SODIUM 40 MG/0.4ML IJ SOSY
40.0000 mg | PREFILLED_SYRINGE | INTRAMUSCULAR | Status: DC
  Administered 2022-11-17 – 2022-11-18 (×2): 40 mg via SUBCUTANEOUS
  Filled 2022-11-17 (×2): qty 0.4

## 2022-11-17 NOTE — Evaluation (Signed)
Physical Therapy Evaluation Patient Details Name: [name] [name] MRN: [id number] DOB: 1932/04/01 Today's Date: 11/17/2022  History of Present Illness  89 yo male presents to [facility] on 1/17 with weakness and multiple falls over the past week. Covid +. CTH negative for acute findings, hypodensity in R cerebellar hemisphere. PMH includes HTN, prostate cancer, hip surgery (unspecified).  Clinical Impression   Pt presents with generalized weakness, impaired balance with history of recent falls, and impaired activity tolerance. Pt to benefit from acute PT to address deficits. Pt ambulated room distance without AD but reaches for environment occasionally to self steady, antalgic gait due to R hip and back pain noted. Pt does not require physical assist for mobility at this time, just close guard for safety. Pt lives with wife, pt likely will only need supervision at home once d/c so recommending HHPT and support of wife. PT to progress mobility as tolerated, and will continue to follow acutely.         Recommendations for follow up therapy are one component of a multi-disciplinary discharge planning process, led by the attending physician.  Recommendations may be updated based on patient status, additional functional criteria and insurance authorization.  Follow Up Recommendations Home health PT      Assistance Recommended at Discharge Frequent or constant Supervision/Assistance  Patient can return home with the following  A little help with walking and/or transfers;A little help with bathing/dressing/bathroom    Equipment Recommendations None recommended by PT  Recommendations for Other Services       Functional Status Assessment Patient has had a recent decline in their functional status and demonstrates the ability to make significant improvements in function in a reasonable and predictable amount of time.     Precautions / Restrictions Precautions Precautions: Fall Restrictions Weight  Bearing Restrictions: No      Mobility  Bed Mobility Overal bed mobility: Needs Assistance Bed Mobility: Supine to Sit, Sit to Supine     Supine to sit: Supervision Sit to supine: Supervision        Transfers Overall transfer level: Needs assistance Equipment used: None Transfers: Sit to/from Stand Sit to Stand: Supervision           General transfer comment: for safety    Ambulation/Gait Ambulation/Gait assistance: Min guard Gait Distance (Feet): 40 Feet Assistive device: None Gait Pattern/deviations: Step-through pattern, Decreased stride length, Trunk flexed Gait velocity: decr     General Gait Details: pt reaching for environment to self-steady, + antalgic gait secondary to R hip and back pain s/p fall. Close guard for pt safety  Stairs            Wheelchair Mobility    Modified Rankin (Stroke Patients Only)       Balance Overall balance assessment: Needs assistance, History of Falls Sitting-balance support: No upper extremity supported, Feet supported Sitting balance-Leahy Scale: Fair     Standing balance support: No upper extremity supported, During functional activity Standing balance-Leahy Scale: Fair                               Pertinent Vitals/Pain Pain Assessment Pain Assessment: Faces Faces Pain Scale: Hurts little more Pain Location: R hip, back Pain Descriptors / Indicators: Sore, Discomfort Pain Intervention(s): Monitored during session, Repositioned, Limited activity within patient's tolerance    Home Living Family/patient expects to be discharged to:: Private residence Living Arrangements: Spouse/significant other (wife Judson Roch) Available Help at Discharge: Family Type  of Home: Apartment Home Access: Level entry       Home Layout: One level Home Equipment: Conservation officer, nature (2 wheels);Cane - single point      Prior Function Prior Level of Function : Independent/Modified Independent              Mobility Comments: pt states he uses a cane or RW as needed, but not all the time       Hand Dominance   Dominant Hand: Right    Extremity/Trunk Assessment   Upper Extremity Assessment Upper Extremity Assessment: Defer to OT evaluation    Lower Extremity Assessment Lower Extremity Assessment: Generalized weakness    Cervical / Trunk Assessment Cervical / Trunk Assessment: Kyphotic  Communication   Communication: No difficulties  Cognition Arousal/Alertness: Awake/alert Behavior During Therapy: WFL for tasks assessed/performed Overall Cognitive Status: No family/caregiver present to determine baseline cognitive functioning                                 General Comments: increased processing time, unsure of accuracy of pt as a historian        General Comments      Exercises     Assessment/Plan    PT Assessment Patient needs continued PT services  PT Problem List Decreased strength;Decreased mobility;Decreased activity tolerance;Decreased balance;Decreased knowledge of use of DME;Pain;Decreased safety awareness       PT Treatment Interventions DME instruction;Therapeutic activities;Gait training;Therapeutic exercise;Patient/family education;Balance training;Functional mobility training;Neuromuscular re-education    PT Goals (Current goals can be found in the Care Plan section)  Acute Rehab PT Goals PT Goal Formulation: With patient Time For Goal Achievement: 12/01/22 Potential to Achieve Goals: Good    Frequency Min 3X/week     Co-evaluation               AM-PAC PT "6 Clicks" Mobility  Outcome Measure Help needed turning from your back to your side while in a flat bed without using bedrails?: None Help needed moving from lying on your back to sitting on the side of a flat bed without using bedrails?: None Help needed moving to and from a bed to a chair (including a wheelchair)?: A Little Help needed standing up from a chair using  your arms (e.g., wheelchair or bedside chair)?: A Little Help needed to walk in hospital room?: A Little Help needed climbing 3-5 steps with a railing? : A Little 6 Click Score: 20    End of Session   Activity Tolerance: Patient tolerated treatment well Patient left: in bed;with call bell/phone within reach;Other (comment) (in ED) Nurse Communication: Mobility status PT Visit Diagnosis: Other abnormalities of gait and mobility (R26.89);Muscle weakness (generalized) (M62.81)    Time: 3354-5625 PT Time Calculation (min) (ACUTE ONLY): 21 min   Charges:   PT Evaluation $PT Eval Low Complexity: 1 Low         Mariama Saintvil S, PT DPT Acute Rehabilitation Services Pager [phone]  Office [phone]   Roxine Caddy E Ruffin Pyo 11/17/2022, 2:38 PM

## 2022-11-17 NOTE — H&P (Addendum)
Date: 11/17/2022               Patient Name:  [name] [name] MRN: [id number]  DOB: 02/16/1932 Age / Sex: 89 y.o., male   PCP: Kristie Cowman, MD         Medical Service: Internal Medicine Teaching Service         Attending Physician: Dr. Campbell Riches, MD      First Contact: Dr. Johny Blamer, DO Pager [phone]    Second Contact: Dr. Farrel Gordon, DO Pager [phone]         After Hours (After 5p/  First Contact Pager: [phone]  weekends / holidays): Second Contact Pager: [phone]   SUBJECTIVE   Chief Complaint: Weakness and mulitple falls  History of Present Illness:   [name] [name] is an 89 y/o person living with a history of hypothyroidism, trifascicular block, HFpEF, HTN, T2DM, iron deficiency anemia, prostate cancer in remission, CKD3a who was brought to the ED by EMS for frequent falls over the last few days. He is unsure why he is falling but notes it happens more frequently when going from a sitting to standing position. He denies any injuries from his falls and denies loss of consciousness. He denies fevers, dizziness, chills, shortness of breath, chest pain, n/v/d, leg pain, difficulties urinating or passing his bowels.   I called and spoke with his wife, Ison Wichmann, who notes the patient has not been at his baseline for the last 5 days. This past weekend they were in [place] for the funeral of Mr. Pickrel twin brother. 5 days ago [name] notes the patient fell out of bed and was looking for his twin brother. When they flew back home she noticed he felt warmer than usual but did not take a temperature. He then fell again the following day after returning tot he grocery store. None of these were witnessed falls and she is unsure if he synopsized. He has no history of falls or passing out spells. She decided to call EMS when he became too weak to get out of bed. EMS report fever of 102F.   She notes they have both been vaccinated for COVID with two shots and a booster.   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 35 PPD. Frequent alcohol use, but stopped 40 years. No other substance use.   Family History:  Mother - Hx of cancer, unsure but think lung cancer per wife Father - Esophageal cancer Twin brother - Alzheimers, raynaud's Brother and nephew - Prostate cancer  Allergies: Allergies as of 11/17/2022 - Review Complete 03/10/2021  Allergen Reaction Noted   Sildenafil Other (See Comments) 03/15/2011   Review of Systems: A complete ROS was negative except as per HPI.   OBJECTIVE:   Physical Exam: Blood pressure (!) 144/90, pulse 63, temperature 98.6 F (37 C), temperature source Oral, resp. rate 14, SpO2 100 %.  Constitutional: well-appearing, elderly appearing and in no acute distress HENT: normocephalic atraumatic, mucous membranes dry Eyes:  conjunctiva non-erythematous Neck: supple Cardiovascular: regular rate and rhythm, no m/r/g Pulmonary/Chest: normal work of breathing on room air, lungs clear to auscultation bilaterally Abdominal: soft, non-tender, non-distended MSK: normal bulk and tone Neurological: alert & oriented to self and time, EOMI and pupils equal reactive to light, no facial asymmetry, no pronator drift, 5/5 strength in bilateral upper and lower extremities, heel to shin normal. Skin: warm and dry Psych: pleasant  Labs: CBC    Component Value Date/Time   WBC 4.2 11/17/2022 0350   RBC  4.71 11/17/2022 0350   HGB 13.9 11/17/2022 0413   HGB 12.2 (L) 06/18/2021 0812   HGB 8.1 (L) 12/02/2020 0945   HCT 41.0 11/17/2022 0413   HCT 25.3 (L) 12/02/2020 0945   PLT 122 (L) 11/17/2022 0350   PLT 198 06/18/2021 0812   PLT 246 12/02/2020 0945   MCV 87.9 11/17/2022 0350   MCV 86 12/02/2020 0945   MCH 29.9 11/17/2022 0350   MCHC 34.1 11/17/2022 0350   RDW 13.2 11/17/2022 0350   RDW 12.5 12/02/2020 0945   LYMPHSABS 0.7 11/17/2022 0350   MONOABS 0.8 11/17/2022 0350   EOSABS 0.0 11/17/2022 0350   BASOSABS 0.0 11/17/2022 0350     CMP     Component Value Date/Time   NA 139 11/17/2022 0413   NA 142 12/02/2020 0945   K 3.5 11/17/2022 0413   CL 106 11/17/2022 0413   CO2 19 (L) 11/17/2022 0350   GLUCOSE 100 (H) 11/17/2022 0413   GLUCOSE 113 (H) 10/11/2006 0936   BUN 13 11/17/2022 0413   BUN 19 12/02/2020 0945   CREATININE 1.20 11/17/2022 0413   CREATININE 1.00 06/18/2021 0812   CALCIUM 8.3 (L) 11/17/2022 0350   PROT 5.9 (L) 11/17/2022 0350   ALBUMIN 3.1 (L) 11/17/2022 0350   AST 32 11/17/2022 0350   AST 15 06/18/2021 0812   ALT 19 11/17/2022 0350   ALT 6 06/18/2021 0812   ALKPHOS 50 11/17/2022 0350   BILITOT 2.0 (H) 11/17/2022 0350   BILITOT 1.1 06/18/2021 0812   GFRNONAA 54 (L) 11/17/2022 0350   GFRNONAA >60 06/18/2021 0812   GFRAA 54 (L) 12/02/2020 0945   Imaging: Pelvic x-ray No acute fracture. S/p right hip arthroplasty with hypertrophic bone over trochanteric region. Prostate markers present  Chest x-ray No focal opacities noted. No blunting of costophrenic angles. No fractures noted.   Head CT w/o contrast Hypodense area of right cerebellum. No prior head imaging for comparison.   Cervical spine w/o contrast Degenerative changes of the cervical spine. No acute fracture.   EKG: personally reviewed my interpretation is is 60 BPM, atrial flutter with 4:1 block. LAD. RBBB. No ST wave changes. Prior EKG from 2022 with evidence of sinus bradycardia 1st degree  AV block and RBBB.   ASSESSMENT & PLAN:   Assessment & Plan by Problem: Principal Problem:   Falls frequently  CONALL VANGORDER is a 89 y.o. person living with a history of hypothyroidism, trifascicular block, HFpEF, HTN, T2DM, iron deficiency anemia, prostate cancer in remission, CKD3a who presented with frequent falls, fevers, and confusion and admitted for frequent falls and COVID-19 infection on hospital day 0  Frequent falls Patient presents with frequent falls over the last 5 days. Uses a cane/walker at baseline since his right hip replacement and has not fallen frequently in the past. These falls were unwitnessed but patient denies losing consciousness. Episodes occurred falling out of bed and walking back into home. He denies dizziness, shortness of breath,  cardiac symptoms. No injuries present. No focal neurological deficits, weakness or abnormal heart sounds. Likely sequela of his COVID infection with weakness and confusion  Discussed abnormal CT imaging with Dr. Quinn Axe of neurology who reviewed imaging. Believes if abnormality seen on CT was infarct than patient would be severely ataxic and dizzy. Low suspicion for cardiac etiology despite what may be new atrial flutter, history does not seem consistent with this. . Do not suspect seizures, again no loss of consciousness, tongue biting, loss of bowel or bladder function.  -PT evaluation pending -cardiac monitor -appreciate Dr. Artemio Aly consult, monitor for neurological changes -orthostatic vital signs pending -B12 pending, chart review hx of deficiency  CoVID 19 infection Last 4 days has had increased confusion, fevers, weakness, and falls. Tested covid positive in the ED. Has had 2 covid vaccines and one booster shot per his wife. Thankfully hemodynamically stable, without dyspnea, cough. no leukocytosis. Xray imaging negative for any opacities and he is saturating well on room air without any respiratory distress. With symptoms, age and  other risk factors will treat with renally dosed paxlovid  -continue to monitor -paxlovid 150/'100mg'$  BID day 1/5  Abnormal head CT Hypotense area on right cerebellum on CT head without contrast. No prior for comparison. Radiology read artifact vs infarct, recommended MRI. Unable to obtain further imaging with right hip replacement/metal in prostate. As per above, do not believe artifact and not infarct. -monitor for neurological changes  Abnormal EKG Hx of bradycardia EKG findings of atrial flutter with 4:1 AV block. Hemodynamically stable. Per chart review has been evaluated by Dr. Farris [facility] with [facility] in the past for his history of sinus bradycardia and trifascicular block. Extensive work up with normal echocardiogram and LV function, and no evidence of chronotropic incompetence. Has previously been asymptotic from his sinus bradycardia. On chart review no history of atrial flutter, but he follows with [facility] and we are unable to see their records. If continues to have falls despite covid treatment, consider further cardiac work up.  -continue cardiac monitor -persistent and new flutter, would obtain echocardiogram -obtain records from [facility] -pending mag and phos levels -Dr. Oneta Rack last note in 2022 mentions 6 month follow up, patient will need schedule appointment at discharge  Hypothyroidism Adherent to home levothyroxine dose per wife. -continue levothyroxine -hold off on checking TSH while acutely ill  Hypertension -hold lisniopril until orthostatics obtained  Hx of prostate cancer in remission -hold tamsulosin with paxlovid treatment  CKD3a -stable and at baseline  Type 2 diabetes Medications reviewed with his wife, no diabetic therapies mentioned or listed in chart. Will continue to monitor while in hospital.   Hyperbilirubinemia No evidence of jaundice. Low suspicion for hemolysis with normal hgb. Normal transaminase levels.  -repeat CMP  tomorrow  Thrombocytopenia Platelet of 122, repeat tomorrow and if still low would get blood smear.   Diet: Normal. Will need wife to bring dentures from home VTE: Enoxaparin IVF: None,None Code: Full  Prior to Admission Living Arrangement: Home, living wife Anticipated Discharge Location: Pending further work up and evaluation Barriers to Discharge: further work up of frequent falls  Dispo: Admit patient to Observation with expected length of stay less than 2 midnights.  Signed: Riesa Pope, MD Internal Medicine Resident PGY-3  11/17/2022, 8:57 AM

## 2022-11-17 NOTE — ED Triage Notes (Signed)
Pt bib [facility] from home c/o generalized weakness+ multiple falls over the past week. Denies, n/v/d, endorses mild abd pain. Per EMS temp 102, '500mg'$  tylenol given PTA. Pt HR 44, 500 NS bolus given.  HR 44 BP 140/70 RR 30 SPO2 98% RA CBG 124

## 2022-11-17 NOTE — ED Notes (Signed)
Pt in bed eating dinner tray, pt reports 3/10 back pain, resps even and unlabored, pt awaits transport.

## 2022-11-17 NOTE — ED Notes (Signed)
Patient transported on monitor with transit at this time in NAD.

## 2022-11-17 NOTE — Plan of Care (Signed)
  Problem: Education: Goal: Knowledge of risk factors and measures for prevention of condition will improve Outcome: Progressing   Problem: Coping: Goal: Psychosocial and spiritual needs will be supported Outcome: Progressing   Problem: Respiratory: Goal: Will maintain a patent airway Outcome: Progressing   

## 2022-11-17 NOTE — ED Notes (Signed)
Pt ambulated around the room and displayed weakness . Had to hold onto bed to stop from stumbling.

## 2022-11-17 NOTE — ED Provider Notes (Signed)
[facility] EMERGENCY DEPARTMENT Provider Note   CSN: [id number] Arrival date & time: 11/17/22  0321     History  No chief complaint on file.   DELONTA YOHANNES is a 89 y.o. male.  The history is provided by the patient and medical records.  Jakobie Henslee Denis is a 89 y.o. male who presents to the Emergency Department complaining of multiple falls.  He presents to the emergency department by EMS from home for evaluation following multiple falls.  He states that he was attempting to get in the house with a bag and tripped and fell and struck his face.  He was unable to get back up so EMS came to assist and he was found to be bradycardic and febrile and it was recommended that he get transported to the emergency department.  He states that he has been falling more over the last month.  He did have a fall in [place] when he was visiting for his twin brother's funeral.  He does report some dysuria and chronic pain in his hips and shoulders.  No sore throat, cough, chest pain, shortness of breath, nausea, vomiting, abdominal pain.  He lives at home with his wife.  He denies any tobacco, alcohol, drug use.  He is unclear of his medical history.    Hx/o arthritis, HTN - takes lisinopril, prostate cancer, thyroid disease Home Medications Prior to Admission medications   Medication Sig Start Date End Date Taking? Authorizing Provider  acetaminophen (TYLENOL) 500 MG tablet Take 500 mg by mouth every 6 (six) hours as needed.    [provider]  Cholecalciferol (VITAMIN D3) 25 MCG (1000 UT) CAPS Take 1,000 Units by mouth daily.    [provider]  esomeprazole (NEXIUM) 40 MG capsule Take 40 mg by mouth daily. 11/20/19   [provider]  ferrous gluconate (FERGON) 324 MG tablet Take 648 mg by mouth daily. 11/21/19   [provider]  levothyroxine (SYNTHROID) 137 MCG tablet Take 137 mcg by mouth daily. 09/24/19   [provider]  lisinopril  (ZESTRIL) 5 MG tablet Take 10 mg by mouth daily.  03/16/20   [provider]  tamsulosin (FLOMAX) 0.4 MG CAPS capsule Take 0.4 mg by mouth daily. 09/24/19   [provider]  traMADol (ULTRAM) 50 MG tablet Take 50 mg by mouth every 4 (four) hours as needed. 09/24/19   [provider]  vitamin B-12 (CYANOCOBALAMIN) 1000 MCG tablet Take 1 tablet (1,000 mcg total) by mouth daily. 01/14/21   Lincoln Brigham, PA-C  Vitamin D, Ergocalciferol, (DRISDOL) 1.25 MG (50000 UT) CAPS capsule Take 50,000 Units by mouth once a week. 06/12/19   [provider]      Allergies    Sildenafil    Review of Systems   Review of Systems  All other systems reviewed and are negative.   Physical Exam Updated Vital Signs BP (!) 150/82   Pulse 64   Temp 99.2 F (37.3 C) (Oral)   Resp 15   SpO2 100%  Physical Exam Vitals and nursing note reviewed.  Constitutional:      Appearance: He is well-developed.  HENT:     Head: Normocephalic and atraumatic.  Cardiovascular:     Rate and Rhythm: Normal rate. Rhythm irregular.     Heart sounds: No murmur heard. Pulmonary:     Effort: Pulmonary effort is normal. No respiratory distress.     Breath sounds: Normal breath sounds.  Abdominal:  Palpations: Abdomen is soft.     Tenderness: There is no guarding or rebound.     Comments: Mild suprapubic and left lower quadrant tenderness.  There is mild tenderness over the hips bilaterally  Musculoskeletal:        General: No tenderness.  Skin:    General: Skin is warm and dry.  Neurological:     Mental Status: He is alert.     Comments: Oriented to person, place and recent events.  Disoriented to time.  Generalized weakness.  Psychiatric:        Behavior: Behavior normal.     ED Results / Procedures / Treatments   Labs (all labs ordered are listed, but only abnormal results are displayed) Labs Reviewed  RESP PANEL BY RT-PCR (RSV, FLU A&B, COVID)  RVPGX2 - Abnormal; Notable for  the following components:      Result Value   SARS Coronavirus 2 by RT PCR POSITIVE (*)    All other components within normal limits  COMPREHENSIVE METABOLIC PANEL - Abnormal; Notable for the following components:   CO2 19 (*)    Glucose, Bld 100 (*)    Creatinine, Ser 1.27 (*)    Calcium 8.3 (*)    Total Protein 5.9 (*)    Albumin 3.1 (*)    Total Bilirubin 2.0 (*)    GFR, Estimated 54 (*)    All other components within normal limits  CBC WITH DIFFERENTIAL/PLATELET - Abnormal; Notable for the following components:   Platelets 122 (*)    All other components within normal limits  PROTIME-INR - Abnormal; Notable for the following components:   Prothrombin Time 16.8 (*)    INR 1.4 (*)    All other components within normal limits  I-STAT CHEM 8, ED - Abnormal; Notable for the following components:   Glucose, Bld 100 (*)    Calcium, Ion 1.14 (*)    TCO2 19 (*)    All other components within normal limits  CULTURE, BLOOD (ROUTINE X 2)  CULTURE, BLOOD (ROUTINE X 2)  URINE CULTURE  LACTIC ACID, PLASMA  APTT  LACTIC ACID, PLASMA  URINALYSIS, ROUTINE W REFLEX MICROSCOPIC    EKG EKG Interpretation  Date/Time:  Wednesday November 17 2022 05:47:21 EST Ventricular Rate:  64 PR Interval:    QRS Duration: 137 QT Interval:  418 QTC Calculation: 432 R Axis:   -53 Text Interpretation: Atrial flutter with predominant 4:1 AV block RBBB and LAFB Left ventricular hypertrophy Confirmed by Quintella Reichert [phone]) on 11/17/2022 5:55:02 AM  Radiology CT Head Wo Contrast  Result Date: 11/17/2022 CLINICAL DATA:  Multiple falls, head and neck trauma. EXAM: CT HEAD WITHOUT CONTRAST CT CERVICAL SPINE WITHOUT CONTRAST TECHNIQUE: Multidetector CT imaging of the head and cervical spine was performed following the standard protocol without intravenous contrast. Multiplanar CT image reconstructions of the cervical spine were also generated. RADIATION DOSE REDUCTION: This exam was performed according to  the departmental dose-optimization program which includes automated exposure control, adjustment of the mA and/or kV according to patient size and/or use of iterative reconstruction technique. COMPARISON:  None Available. FINDINGS: CT HEAD FINDINGS Brain: No acute intracranial hemorrhage, midline shift or mass effect. No extra-axial fluid collection. Periventricular white matter hypodensities are present bilaterally. No hydrocephalus. Hypodense region is noted in the cerebellum on the right. Vascular: No hyperdense vessel or unexpected calcification. Skull: No acute fracture.  Hyperostosis frontalis is noted. Sinuses/Orbits: Mild diffuse mucosal thickening in the paranasal sinuses. No acute orbital abnormality. Other: None. CT  CERVICAL SPINE FINDINGS Alignment: Mild anterolisthesis at C2-C3. Skull base and vertebrae: No acute fracture. There is fusion of the C6-C7 vertebral bodies. Soft tissues and spinal canal: No prevertebral fluid or swelling. No visible canal hematoma. Disc levels: Multilevel intervertebral disc space narrowing, uncovertebral osteophyte formation and facet arthropathy, most pronounced at C5-C6 and C6-C7. Upper chest: No acute abnormality. Other: Carotid artery calcifications. IMPRESSION: 1. No acute intracranial haemorrhage. 2. Vague hypodensity in the right cerebellar hemisphere, possible beam hardening artifact versus infarct. MRI is suggested for further evaluation. 3. Chronic microvascular ischemic changes. 4. Multilevel degenerative changes in the cervical spine without evidence of acute fracture. Electronically Signed   By: Brett Fairy M.D.   On: 11/17/2022 04:40   CT Cervical Spine Wo Contrast  Result Date: 11/17/2022 CLINICAL DATA:  Multiple falls, head and neck trauma. EXAM: CT HEAD WITHOUT CONTRAST CT CERVICAL SPINE WITHOUT CONTRAST TECHNIQUE: Multidetector CT imaging of the head and cervical spine was performed following the standard protocol without intravenous contrast.  Multiplanar CT image reconstructions of the cervical spine were also generated. RADIATION DOSE REDUCTION: This exam was performed according to the departmental dose-optimization program which includes automated exposure control, adjustment of the mA and/or kV according to patient size and/or use of iterative reconstruction technique. COMPARISON:  None Available. FINDINGS: CT HEAD FINDINGS Brain: No acute intracranial hemorrhage, midline shift or mass effect. No extra-axial fluid collection. Periventricular white matter hypodensities are present bilaterally. No hydrocephalus. Hypodense region is noted in the cerebellum on the right. Vascular: No hyperdense vessel or unexpected calcification. Skull: No acute fracture.  Hyperostosis frontalis is noted. Sinuses/Orbits: Mild diffuse mucosal thickening in the paranasal sinuses. No acute orbital abnormality. Other: None. CT CERVICAL SPINE FINDINGS Alignment: Mild anterolisthesis at C2-C3. Skull base and vertebrae: No acute fracture. There is fusion of the C6-C7 vertebral bodies. Soft tissues and spinal canal: No prevertebral fluid or swelling. No visible canal hematoma. Disc levels: Multilevel intervertebral disc space narrowing, uncovertebral osteophyte formation and facet arthropathy, most pronounced at C5-C6 and C6-C7. Upper chest: No acute abnormality. Other: Carotid artery calcifications. IMPRESSION: 1. No acute intracranial haemorrhage. 2. Vague hypodensity in the right cerebellar hemisphere, possible beam hardening artifact versus infarct. MRI is suggested for further evaluation. 3. Chronic microvascular ischemic changes. 4. Multilevel degenerative changes in the cervical spine without evidence of acute fracture. Electronically Signed   By: Brett Fairy M.D.   On: 11/17/2022 04:40   DG Chest Port 1 View  Result Date: 11/17/2022 CLINICAL DATA:  Possible sepsis, fevers EXAM: PORTABLE CHEST 1 VIEW COMPARISON:  12/02/2020 FINDINGS: The heart size and mediastinal  contours are within normal limits. Both lungs are clear. The visualized skeletal structures are unremarkable. IMPRESSION: No active disease. Electronically Signed   By: Inez Catalina M.D.   On: 11/17/2022 03:57   DG Pelvis Portable  Result Date: 11/17/2022 CLINICAL DATA:  Sepsis EXAM: PORTABLE PELVIS 1-2 VIEWS COMPARISON:  10/27/2006 FINDINGS: No acute fracture, erosion, or visible bone lesion. Right hip arthroplasty with hypertrophic bone at the trochanteric region. The covered lumbar spine shows disc degeneration with mild scoliosis. Prostate fiducial markers. IMPRESSION: No acute finding. Electronically Signed   By: Jorje Guild M.D.   On: 11/17/2022 03:56    Procedures Procedures    Medications Ordered in ED Medications - No data to display  ED Course/ Medical Decision Making/ A&P  Medical Decision Making Amount and/or Complexity of Data Reviewed Labs: ordered. Radiology: ordered. ECG/medicine tests: ordered.  Risk Decision regarding hospitalization.   Patient with history of hypertension, remote history of prostate cancer, hypothyroid here for evaluation following multiple falls with weakness, unable to get up.  He was found to be febrile today prior to ED arrival.  His fever did improve with acetaminophen administration by EMS.  He is globally weak and requires assistance to ambulate.  Chest x-ray negative for acute infiltrate-images personally reviewed and interpreted, agree with radiologist interpretation.  CT head and C-spine are negative for acute process.  He is noted to be in rate controlled atrial fibrillation in the emergency department.  EMS reported heart rate in the 40s.  No definite history of atrial fibrillation but his wife does note that he had a heart rhythm issue that had been seen by a cardiologist that was referred to by his PCP at [facility].  Those records are not available.  His heart was reached via phone for additional  history information.  He is positive for COVID-19.  Given his multiple falls, weakness and unsteadiness in the emergency department and COVID-19 status recommend observation admission.  Medicine consulted for admission for ongoing care.  UA pending at time of admission call.        Final Clinical Impression(s) / ED Diagnoses Final diagnoses:  COVID-19 virus infection  Fall, initial encounter  Atrial fibrillation, unspecified type [facility])    Rx / DC Orders ED Discharge Orders     None         Quintella Reichert, MD 11/17/22 [phone]

## 2022-11-17 NOTE — ED Notes (Signed)
ED TO INPATIENT HANDOFF REPORT  ED Nurse Name and Phone #: Darnelle Maffucci [phone]  J Name/Age/Gender [name] [name] 89 y.o. male Room/Bed: 011C/011C  Code Status   Code Status: Full Code  Home/SNF/Other Nursing Home. Independent living Patient oriented to: self, place, and time Is this baseline? Yes   Triage Complete: Triage complete  Chief Complaint Falls frequently [R29.6]  Triage Note Pt bib [facility] from home c/o generalized weakness+ multiple falls over the past week. Denies, n/v/d, endorses mild abd pain. Per EMS temp 102, '500mg'$  tylenol given PTA. Pt HR 44, 500 NS bolus given.  HR 44 BP 140/70 RR 30 SPO2 98% RA CBG 124   Allergies Allergies  Allergen Reactions   Sildenafil Other (See Comments)    Other reaction(s): Blurring of visual image    Level of Care/Admitting Diagnosis ED Disposition     ED Disposition  Admit   Condition  --   Comment  Hospital Area: [facility] [[id number]]  Level of Care: Telemetry Medical [104]  May place patient in observation at [facility] or [facility] if equivalent level of care is available:: No  Covid Evaluation: Confirmed COVID Positive  Diagnosis: Falls frequently [570177]  Admitting Physician: Campbell Riches [[id number]]  Attending Physician: HATCHER, [name] C [[id number]]          B Medical/Surgery History Past Medical History:  Diagnosis Date   Hypertension    Hypothyroidism    Prostate CA ([place])    Prostate cancer [facility])    Past Surgical History:  Procedure Laterality Date   HIP SURGERY       A IV Location/Drains/Wounds Patient Lines/Drains/Airways Status     Active Line/Drains/Airways     Name Placement date Placement time Site Days   Peripheral IV 11/17/22 18 G 1.16" Anterior;Proximal;Right Forearm 11/17/22  0413  Forearm  less than 1            Intake/Output Last 24 hours No intake or output data in the 24 hours ending 11/17/22 1751  Labs/Imaging Results for orders placed or performed  during the hospital encounter of 11/17/22 (from the past 48 hour(s))  Lactic acid, plasma     Status: None   Collection Time: 11/17/22  3:50 AM  Result Value Ref Range   Lactic Acid, Venous 1.2 0.5 - 1.9 mmol/L    Comment: Performed at [facility], [address] [place], [place] [address]  Comprehensive metabolic panel     Status: Abnormal   Collection Time: 11/17/22  3:50 AM  Result Value Ref Range   Sodium 138 135 - 145 mmol/L   Potassium 3.5 3.5 - 5.1 mmol/L   Chloride 108 98 - 111 mmol/L   CO2 19 (L) 22 - 32 mmol/L   Glucose, Bld 100 (H) 70 - 99 mg/dL    Comment: Glucose reference range applies only to samples taken after fasting for at least 8 hours.   BUN 13 8 - 23 mg/dL   Creatinine, Ser 1.27 (H) 0.61 - 1.24 mg/dL   Calcium 8.3 (L) 8.9 - 10.3 mg/dL   Total Protein 5.9 (L) 6.5 - 8.1 g/dL   Albumin 3.1 (L) 3.5 - 5.0 g/dL   AST 32 15 - 41 U/L   ALT 19 0 - 44 U/L   Alkaline Phosphatase 50 38 - 126 U/L   Total Bilirubin 2.0 (H) 0.3 - 1.2 mg/dL   GFR, Estimated 54 (L) >60 mL/min    Comment: (NOTE) Calculated using the CKD-EPI Creatinine  Equation (2021)    Anion gap 11 5 - 15    Comment: Performed at [facility], [place] [address], [place], [place] [address]  CBC with Differential     Status: Abnormal   Collection Time: 11/17/22  3:50 AM  Result Value Ref Range   WBC 4.2 4.0 - 10.5 K/uL   RBC 4.71 4.22 - 5.81 MIL/uL   Hemoglobin 14.1 13.0 - 17.0 g/dL   HCT 41.4 39.0 - 52.0 %   MCV 87.9 80.0 - 100.0 fL   MCH 29.9 26.0 - 34.0 pg   MCHC 34.1 30.0 - 36.0 g/dL   RDW 13.2 11.5 - 15.5 %   Platelets 122 (L) 150 - 400 K/uL   nRBC 0.0 0.0 - 0.2 %   Neutrophils Relative % 67 %   Neutro Abs 2.8 1.7 - 7.7 K/uL   Lymphocytes Relative 15 %   Lymphs Abs 0.7 0.7 - 4.0 K/uL   Monocytes Relative 18 %   Monocytes Absolute 0.8 0.1 - 1.0 K/uL   Eosinophils Relative 0 %   Eosinophils Absolute 0.0 0.0 - 0.5 K/uL   Basophils Relative 0 %   Basophils Absolute 0.0 0.0 - 0.1 K/uL    Immature Granulocytes 0 %   Abs Immature Granulocytes 0.01 0.00 - 0.07 K/uL    Comment: Performed at [facility] [address] [place], [place] [address]  Protime-INR     Status: Abnormal   Collection Time: 11/17/22  3:50 AM  Result Value Ref Range   Prothrombin Time 16.8 (H) 11.4 - 15.2 seconds   INR 1.4 (H) 0.8 - 1.2    Comment: (NOTE) INR goal varies based on device and disease states. Performed at [facility], [place] [address] [place], [place] [address]   APTT     Status: None   Collection Time: 11/17/22  3:50 AM  Result Value Ref Range   aPTT 35 24 - 36 seconds    Comment: Performed at [facility] [address], [place], [place] [address]  Resp panel by RT-PCR (RSV, Flu A&B, Covid)     Status: Abnormal   Collection Time: 11/17/22  3:50 AM   Specimen: Nasal Swab  Result Value Ref Range   SARS Coronavirus 2 by RT PCR POSITIVE (A) NEGATIVE    Comment: (NOTE) SARS-CoV-2 target nucleic acids are DETECTED.  The SARS-CoV-2 RNA is generally detectable in upper respiratory specimens during the acute phase of infection. Positive results are indicative of the presence of the identified virus, but do not rule out bacterial infection or co-infection with other pathogens not detected by the test. Clinical correlation with patient history and other diagnostic information is necessary to determine patient infection status. The expected result is Negative.  Fact Sheet for Patients: EntrepreneurPulse.com.au  Fact Sheet for Healthcare Providers: IncredibleEmployment.be  This test is not yet approved or cleared by the [place] FDA and  has been authorized for detection and/or diagnosis of SARS-CoV-2 by FDA under an Emergency Use Authorization (EUA).  This EUA will remain in effect (meaning this test can be used) for the duration of  the COVID-19 declaration under Section 564(b)(1) of the A ct, 21 U.S.C. section  360bbb-3(b)(1), unless the authorization is terminated or revoked sooner.     Influenza A by PCR NEGATIVE NEGATIVE   Influenza B by PCR NEGATIVE NEGATIVE    Comment: (NOTE) The Xpert Xpress SARS-CoV-2/FLU/RSV plus assay is intended as an aid in the diagnosis  of influenza from Nasopharyngeal swab specimens and should not be used as a sole basis for treatment. Nasal washings and aspirates are unacceptable for Xpert Xpress SARS-CoV-2/FLU/RSV testing.  Fact Sheet for Patients: EntrepreneurPulse.com.au  Fact Sheet for Healthcare Providers: IncredibleEmployment.be  This test is not yet approved or cleared by the [place] FDA and has been authorized for detection and/or diagnosis of SARS-CoV-2 by FDA under an Emergency Use Authorization (EUA). This EUA will remain in effect (meaning this test can be used) for the duration of the COVID-19 declaration under Section 564(b)(1) of the Act, 21 U.S.C. section 360bbb-3(b)(1), unless the authorization is terminated or revoked.     Resp Syncytial Virus by PCR NEGATIVE NEGATIVE    Comment: (NOTE) Fact Sheet for Patients: EntrepreneurPulse.com.au  Fact Sheet for Healthcare Providers: IncredibleEmployment.be  This test is not yet approved or cleared by the [place] FDA and has been authorized for detection and/or diagnosis of SARS-CoV-2 by FDA under an Emergency Use Authorization (EUA). This EUA will remain in effect (meaning this test can be used) for the duration of the COVID-19 declaration under Section 564(b)(1) of the Act, 21 U.S.C. section 360bbb-3(b)(1), unless the authorization is terminated or revoked.  Performed at [facility], [place] [address] [place], [place] [address]   I-Stat Chem 8, ED     Status: Abnormal   Collection Time: 11/17/22  4:13 AM  Result Value Ref Range   Sodium 139 135 - 145 mmol/L   Potassium 3.5 3.5 - 5.1 mmol/L    Chloride 106 98 - 111 mmol/L   BUN 13 8 - 23 mg/dL   Creatinine, Ser 1.20 0.61 - 1.24 mg/dL   Glucose, Bld 100 (H) 70 - 99 mg/dL    Comment: Glucose reference range applies only to samples taken after fasting for at least 8 hours.   Calcium, Ion 1.14 (L) 1.15 - 1.40 mmol/L   TCO2 19 (L) 22 - 32 mmol/L   Hemoglobin 13.9 13.0 - 17.0 g/dL   HCT 41.0 39.0 - 52.0 %  Urinalysis, Routine w reflex microscopic Urine, Clean Catch     Status: Abnormal   Collection Time: 11/17/22  6:40 AM  Result Value Ref Range   Color, Urine YELLOW YELLOW   APPearance CLEAR CLEAR   Specific Gravity, Urine 1.009 1.005 - 1.030   pH 5.0 5.0 - 8.0   Glucose, UA NEGATIVE NEGATIVE mg/dL   Hgb urine dipstick SMALL (A) NEGATIVE   Bilirubin Urine NEGATIVE NEGATIVE   Ketones, ur 20 (A) NEGATIVE mg/dL   Protein, ur NEGATIVE NEGATIVE mg/dL   Nitrite NEGATIVE NEGATIVE   Leukocytes,Ua NEGATIVE NEGATIVE   RBC / HPF 0-5 0 - 5 RBC/hpf   WBC, UA 0-5 0 - 5 WBC/hpf   Bacteria, UA NONE SEEN NONE SEEN   Squamous Epithelial / HPF 0-5 0 - 5 /HPF   Mucus PRESENT     Comment: Performed at [facility], [place] [address] [place], [place] [address]  Magnesium     Status: None   Collection Time: 11/17/22  2:43 PM  Result Value Ref Range   Magnesium 1.9 1.7 - 2.4 mg/dL    Comment: Performed at [facility], [place] [address], [place], [place] [address]  Phosphorus     Status: None   Collection Time: 11/17/22  2:43 PM  Result Value Ref Range   Phosphorus 2.9 2.5 - 4.6 mg/dL    Comment: Performed at [facility] [address],  [place], [place] [address]  Lactic acid, plasma     Status: Abnormal   Collection Time: 11/17/22  2:45 PM  Result Value Ref Range   Lactic Acid, Venous 2.1 (HH) 0.5 - 1.9 mmol/L    Comment: CRITICAL RESULT CALLED TO, READ BACK BY AND VERIFIED WITH K.SWORD,RN '@[id number]'$  11/17/2022 [email or web address].J Performed at [facility], [place] [address], [place], [place] [address]    CT Head Wo Contrast  Result  Date: 11/17/2022 CLINICAL DATA:  Multiple falls, head and neck trauma. EXAM: CT HEAD WITHOUT CONTRAST CT CERVICAL SPINE WITHOUT CONTRAST TECHNIQUE: Multidetector CT imaging of the head and cervical spine was performed following the standard protocol without intravenous contrast. Multiplanar CT image reconstructions of the cervical spine were also generated. RADIATION DOSE REDUCTION: This exam was performed according to the departmental dose-optimization program which includes automated exposure control, adjustment of the mA and/or kV according to patient size and/or use of iterative reconstruction technique. COMPARISON:  None Available. FINDINGS: CT HEAD FINDINGS Brain: No acute intracranial hemorrhage, midline shift or mass effect. No extra-axial fluid collection. Periventricular white matter hypodensities are present bilaterally. No hydrocephalus. Hypodense region is noted in the cerebellum on the right. Vascular: No hyperdense vessel or unexpected calcification. Skull: No acute fracture.  Hyperostosis frontalis is noted. Sinuses/Orbits: Mild diffuse mucosal thickening in the paranasal sinuses. No acute orbital abnormality. Other: None. CT CERVICAL SPINE FINDINGS Alignment: Mild anterolisthesis at C2-C3. Skull base and vertebrae: No acute fracture. There is fusion of the C6-C7 vertebral bodies. Soft tissues and spinal canal: No prevertebral fluid or swelling. No visible canal hematoma. Disc levels: Multilevel intervertebral disc space narrowing, uncovertebral osteophyte formation and facet arthropathy, most pronounced at C5-C6 and C6-C7. Upper chest: No acute abnormality. Other: Carotid artery calcifications. IMPRESSION: 1. No acute intracranial haemorrhage. 2. Vague hypodensity in the right cerebellar hemisphere, possible beam hardening artifact versus infarct. MRI is suggested for further evaluation. 3. Chronic microvascular ischemic changes. 4. Multilevel degenerative changes in the cervical spine without  evidence of acute fracture. Electronically Signed   By: Brett Fairy M.D.   On: 11/17/2022 04:40   CT Cervical Spine Wo Contrast  Result Date: 11/17/2022 CLINICAL DATA:  Multiple falls, head and neck trauma. EXAM: CT HEAD WITHOUT CONTRAST CT CERVICAL SPINE WITHOUT CONTRAST TECHNIQUE: Multidetector CT imaging of the head and cervical spine was performed following the standard protocol without intravenous contrast. Multiplanar CT image reconstructions of the cervical spine were also generated. RADIATION DOSE REDUCTION: This exam was performed according to the departmental dose-optimization program which includes automated exposure control, adjustment of the mA and/or kV according to patient size and/or use of iterative reconstruction technique. COMPARISON:  None Available. FINDINGS: CT HEAD FINDINGS Brain: No acute intracranial hemorrhage, midline shift or mass effect. No extra-axial fluid collection. Periventricular white matter hypodensities are present bilaterally. No hydrocephalus. Hypodense region is noted in the cerebellum on the right. Vascular: No hyperdense vessel or unexpected calcification. Skull: No acute fracture.  Hyperostosis frontalis is noted. Sinuses/Orbits: Mild diffuse mucosal thickening in the paranasal sinuses. No acute orbital abnormality. Other: None. CT CERVICAL SPINE FINDINGS Alignment: Mild anterolisthesis at C2-C3. Skull base and vertebrae: No acute fracture. There is fusion of the C6-C7 vertebral bodies. Soft tissues and spinal canal: No prevertebral fluid or swelling. No visible canal hematoma. Disc levels: Multilevel intervertebral disc space narrowing, uncovertebral osteophyte formation and facet arthropathy, most pronounced at C5-C6 and C6-C7. Upper chest: No acute abnormality. Other: Carotid artery calcifications. IMPRESSION: 1. No acute intracranial haemorrhage. 2. Vague  hypodensity in the right cerebellar hemisphere, possible beam hardening artifact versus infarct. MRI is  suggested for further evaluation. 3. Chronic microvascular ischemic changes. 4. Multilevel degenerative changes in the cervical spine without evidence of acute fracture. Electronically Signed   By: Brett Fairy M.D.   On: 11/17/2022 04:40   DG Chest Port 1 View  Result Date: 11/17/2022 CLINICAL DATA:  Possible sepsis, fevers EXAM: PORTABLE CHEST 1 VIEW COMPARISON:  12/02/2020 FINDINGS: The heart size and mediastinal contours are within normal limits. Both lungs are clear. The visualized skeletal structures are unremarkable. IMPRESSION: No active disease. Electronically Signed   By: Inez Catalina M.D.   On: 11/17/2022 03:57   DG Pelvis Portable  Result Date: 11/17/2022 CLINICAL DATA:  Sepsis EXAM: PORTABLE PELVIS 1-2 VIEWS COMPARISON:  10/27/2006 FINDINGS: No acute fracture, erosion, or visible bone lesion. Right hip arthroplasty with hypertrophic bone at the trochanteric region. The covered lumbar spine shows disc degeneration with mild scoliosis. Prostate fiducial markers. IMPRESSION: No acute finding. Electronically Signed   By: Jorje Guild M.D.   On: 11/17/2022 03:56    Pending Labs Unresulted Labs (From admission, onward)     Start     Ordered   11/18/22 5732  Basic metabolic panel  Tomorrow morning,   R        11/17/22 1346   11/17/22 1024  Vitamin B12  Once,   R        11/17/22 1023   11/17/22 0336  Blood Culture (routine x 2)  (Undifferentiated presentation (screening labs and basic nursing orders))  BLOOD CULTURE X 2,   STAT      11/17/22 0337   11/17/22 0336  Urine Culture  (Undifferentiated presentation (screening labs and basic nursing orders))  ONCE - URGENT,   URGENT       Question:  Indication  Answer:  Dysuria   11/17/22 0337            Vitals/Pain Today's Vitals   11/17/22 1500 11/17/22 1631 11/17/22 1703 11/17/22 1725  BP: (!) 163/77   (!) 152/88  Pulse: (!) 114   71  Resp: (!) 27   (!) 21  Temp:  98.5 F (36.9 C)  98.2 F (36.8 C)  TempSrc:  Oral  Oral   SpO2: 96%   99%  PainSc:   0-No pain 0-No pain    Isolation Precautions Airborne and Contact precautions  Medications Medications  enoxaparin (LOVENOX) injection 40 mg (40 mg Subcutaneous Given 11/17/22 1220)  levothyroxine (SYNTHROID) tablet 137 mcg (137 mcg Oral Given 11/17/22 1220)  acetaminophen (TYLENOL) tablet 650 mg (has no administration in time range)  nirmatrelvir/ritonavir (renal dosing) (PAXLOVID) 2 tablet (2 tablets Oral Given 11/17/22 1700)  lisinopril (ZESTRIL) tablet 10 mg (has no administration in time range)    Mobility walks with device       R Recommendations: See Admitting Provider Note  Report given to:   Additional Notes:

## 2022-11-18 ENCOUNTER — Encounter: Payer: Self-pay | Admitting: Physician Assistant

## 2022-11-18 ENCOUNTER — Encounter (HOSPITAL_COMMUNITY): Payer: Self-pay | Admitting: Infectious Diseases

## 2022-11-18 ENCOUNTER — Other Ambulatory Visit (HOSPITAL_COMMUNITY): Payer: Self-pay

## 2022-11-18 ENCOUNTER — Observation Stay (HOSPITAL_BASED_OUTPATIENT_CLINIC_OR_DEPARTMENT_OTHER): Payer: Medicare Other

## 2022-11-18 ENCOUNTER — Telehealth: Payer: Self-pay

## 2022-11-18 DIAGNOSIS — I4819 Other persistent atrial fibrillation: Secondary | ICD-10-CM | POA: Diagnosis not present

## 2022-11-18 DIAGNOSIS — I4891 Unspecified atrial fibrillation: Secondary | ICD-10-CM | POA: Diagnosis not present

## 2022-11-18 DIAGNOSIS — S098XXA Other specified injuries of head, initial encounter: Secondary | ICD-10-CM | POA: Diagnosis not present

## 2022-11-18 DIAGNOSIS — U071 COVID-19: Secondary | ICD-10-CM | POA: Diagnosis not present

## 2022-11-18 DIAGNOSIS — Z79899 Other long term (current) drug therapy: Secondary | ICD-10-CM | POA: Diagnosis not present

## 2022-11-18 DIAGNOSIS — I4892 Unspecified atrial flutter: Secondary | ICD-10-CM

## 2022-11-18 LAB — T4, FREE: Free T4: 1.63 ng/dL — ABNORMAL HIGH (ref 0.61–1.12)

## 2022-11-18 LAB — BASIC METABOLIC PANEL
Anion gap: 6 (ref 5–15)
BUN: 13 mg/dL (ref 8–23)
CO2: 24 mmol/L (ref 22–32)
Calcium: 8.3 mg/dL — ABNORMAL LOW (ref 8.9–10.3)
Chloride: 105 mmol/L (ref 98–111)
Creatinine, Ser: 1.1 mg/dL (ref 0.61–1.24)
GFR, Estimated: >60 mL/min (ref >60)
Glucose, Bld: 90 mg/dL (ref 70–99)
Potassium: 3.4 mmol/L — ABNORMAL LOW (ref 3.5–5.1)
Sodium: 135 mmol/L (ref 135–145)

## 2022-11-18 LAB — CBC
HCT: 41.3 % (ref 39.0–52.0)
Hemoglobin: 14.6 g/dL (ref 13.0–17.0)
MCH: 30.5 pg (ref 26.0–34.0)
MCHC: 35.4 g/dL (ref 30.0–36.0)
MCV: 86.2 fL (ref 80.0–100.0)
Platelets: 132 10*3/uL — ABNORMAL LOW (ref 150–400)
RBC: 4.79 MIL/uL (ref 4.22–5.81)
RDW: 13.2 % (ref 11.5–15.5)
WBC: 3.2 10*3/uL — ABNORMAL LOW (ref 4.0–10.5)
nRBC: 0 % (ref 0.0–0.2)

## 2022-11-18 LAB — ECHOCARDIOGRAM COMPLETE
Area-P 1/2: 2.77 cm2
Height: 76 in
S' Lateral: 3.2 cm
Weight: 3125.24 oz

## 2022-11-18 LAB — TSH: TSH: 0.015 u[IU]/mL — ABNORMAL LOW (ref 0.350–4.500)

## 2022-11-18 MED ORDER — NIRMATRELVIR/RITONAVIR (PAXLOVID) TABLET (RENAL DOSING): 2.0000 | ORAL_TABLET | Freq: Two times a day (BID) | ORAL | 0 refills | Status: AC

## 2022-11-18 MED ORDER — APIXABAN 2.5 MG PO TABS: 2.5000 mg | ORAL_TABLET | Freq: Two times a day (BID) | ORAL | 0 refills | Status: DC

## 2022-11-18 MED ORDER — POTASSIUM CHLORIDE CRYS ER 20 MEQ PO TBCR
40.0000 meq | EXTENDED_RELEASE_TABLET | Freq: Once | ORAL | Status: AC
  Administered 2022-11-18: 40 meq via ORAL
  Filled 2022-11-18: qty 2

## 2022-11-18 MED ORDER — APIXABAN 2.5 MG PO TABS
2.5000 mg | ORAL_TABLET | Freq: Two times a day (BID) | ORAL | Status: DC
  Administered 2022-11-18: 2.5 mg via ORAL
  Filled 2022-11-18: qty 1

## 2022-11-18 MED ORDER — LEVOTHYROXINE SODIUM 112 MCG PO TABS: 112.0000 ug | ORAL_TABLET | Freq: Every day | ORAL | 11 refills | Status: DC

## 2022-11-18 NOTE — Hospital Course (Addendum)
01/18: sore in both shoulders from the fall. [name] [name] raising arm above horizontal due to pain. Thinks he has hurt the shoulder before. Mentions falling once before at a restaurant in feb 2023, was getting out of the booth, shoe got hung on table leg wic led him to fall ultimatel. Has fallen 2-3 other times. When he fell this weekend he ha is hands full of food and was getting out of the car. A box started to slide off and when he went to grab it he fell down.  -fall 1 at restaurant in feb -fall 2 1 wek later fell tripping over blacktop after getng out of car -fall 3 this one

## 2022-11-18 NOTE — Progress Notes (Signed)
  Echocardiogram 2D Echocardiogram has been performed.  [name] [name] 11/18/2022, 9:47 AM

## 2022-11-18 NOTE — TOC Benefit Eligibility Note (Signed)
Patient Advocate Encounter  Insurance verification completed.    The patient is currently admitted and upon discharge could be taking Eliquis 5 mg.  The current 30 day co-pay is $47.00.   The patient is insured through [employer] Medicare Part D   Kendarrius Tanzi, CPHT Pharmacy Patient Advocate Specialist [facility] Patient Advocate Team Direct Number: [phone]  Fax: [phone]       

## 2022-11-18 NOTE — Telephone Encounter (Signed)
Lane from [facility] called regarding rx for Plaxlovis you wrote a rx for 3 days the rx only comes in a 5 pack Orene Desanctis, is also requesting alternative for Eliquis, Please return Lane's call to verify instructions and to discuss alternative medications '@[phone]'$ [phone]

## 2022-11-18 NOTE — Progress Notes (Signed)
Mobility Specialist - Progress Note   11/18/22 1109  Mobility  Activity Ambulated with assistance in hallway  Level of Assistance Contact guard assist, steadying assist  Assistive Device Front wheel walker  Distance Ambulated (ft) 200 ft  Activity Response Tolerated well  Mobility Referral Yes  $Mobility charge 1 Mobility    Pt received in bed agreeable to mobility. No complaints throughout. Left sitting EOB w/ call bell in reach and bed alarm on.   [place] Specialist Please contact via SecureChat or Rehab office at [phone]

## 2022-11-18 NOTE — Consult Note (Addendum)
Cardiology Consultation   Patient ID: [name] [name] MRN: [id number]; DOB: Jul 06, 1932  Admit date: 11/17/2022 Date of Consult: 11/18/2022  PCP:  Kristie Cowman, MD   [facility] Cardiologist:  Evalina Field, MD        Patient Profile:   [name] [name] is a 89 y.o. male with a hx of trifasicular block (RBBB+LAFB+first degree AVB), chronic HFpEF, HTN, CKD stage 3a, IDA, DM, B12 deficiency, falls, prostate CA who is being seen 11/18/2022 for the evaluation of atrial flutter at the request of Dr. Marlon Pel.  History of Present Illness:   [name] [name] has previously followed with Dr. Audie [name] with the above history. He had a reassuring nuc in 11/2019. He is also known to have trifascicular block with ETT in 01/2020 without chronotropic incompetence. He was previously supposed to wear a monitor for dizziness which he did for a week but it did not record any findings - since doing well at last f/u 03/2021, repeat was not pursued. He has a h/o unsteadiness as well.   He presented this admission with multiple falls over the last week. Looking back he thinks over the last year he's fallen at least half a dozen times. The day of admission he was trying to get into his house and tripped and fell and struck his face. He was unable to get up so EMS was called. He was found to be bradycardic and febrile at 102F with HR 44bpm. EKG at bedside showed atrial flutter with HR 48bpm. He was given 500cc bolus and transferred to the hospital where he tested positive for Covid. He denies any syncope. He had attended a funeral in [place] recently for his twin brother. He denies any recent chest pain or SOB. No edema or overt dizziness. He's had a mild cough. The falls seemed to be of a mechanical nature usually related to tripping or unsteadiness. Initial EKG here showed atrial flutter 4:1 block with RBBB+LAFB. Labs notable for Cr 1.27->1.10 similar to prior baseline, mild thrombocytopenia/leukopenia, Mg 1.9, K  3.4-3.5 range, lactic acid peak of 2.3, normal Hgb. 2D echo showed EF 60-65%, mild LVH, mild LAE, aortic sclerosis without stenosis, no significant change from prior. CXR NAD. CT head with no acute intracranial hemorrhage, + chronic ischemic changes, multilevel degenerative changes in c-spine without fx, vague hypodensity in the right cerebellar hemisphere, possible beam hardening artifact versus infarct, MRI is suggested for further evaluation, not pursued. BP appears labile from 101/89 to 163/90. He denies any acute complaint. He is not presently on telemetry any longer.  EKG today is more consistent with atrial fib with occasional PVC and known RBBB/LAFB, HR 80.  Past Medical History:  Diagnosis Date   B12 deficiency    Chronic heart failure with preserved ejection fraction (HFpEF) (HCC)    Falls    Hypertension    Hypothyroidism    Iron deficiency anemia    Prostate cancer ([place])    Trifascicular block     Past Surgical History:  Procedure Laterality Date   HIP SURGERY       Home Medications:  Prior to Admission medications   Medication Sig Start Date End Date Taking? Authorizing Provider  acetaminophen (TYLENOL) 500 MG tablet Take 500 mg by mouth every 6 (six) hours as needed.   Yes [provider]  etodolac (LODINE) 400 MG tablet Take 400 mg by mouth 2 (two) times daily as needed for mild pain. 11/11/22  Yes [provider]  levothyroxine (SYNTHROID)  150 MCG tablet Take 150 mcg by mouth daily before breakfast.   Yes [provider]  lisinopril (ZESTRIL) 10 MG tablet Take 10 mg by mouth daily.   Yes [provider]  methocarbamol (ROBAXIN) 500 MG tablet Take 500 mg by mouth 4 (four) times daily as needed for muscle spasms. 11/11/22  Yes [provider]  polyethylene glycol (MIRALAX / GLYCOLAX) 17 g packet Take 17 g by mouth daily.   Yes [provider]  tamsulosin (FLOMAX) 0.4 MG CAPS capsule Take 0.4 mg by mouth daily. 09/24/19   Yes [provider]    Inpatient Medications: Scheduled Meds:  enoxaparin (LOVENOX) injection  40 mg Subcutaneous Q24H   levothyroxine  137 mcg Oral Q0600   lisinopril  10 mg Oral Daily   nirmatrelvir/ritonavir (renal dosing)  2 tablet Oral BID   potassium chloride  40 mEq Oral Once   Continuous Infusions:  PRN Meds: acetaminophen  Allergies:    Allergies  Allergen Reactions   Sildenafil Other (See Comments)    Other reaction(s): Blurring of visual image    Social History:   Social History   Socioeconomic History   Marital status: Married    Spouse name: Not on file   Number of children: 3   Years of education: Not on file   Highest education level: Not on file  Occupational History   Occupation: retired   Tobacco Use   Smoking status: Former    Years: 30.00    Types: Cigarettes   Smokeless tobacco: Never  Substance and Sexual Activity   Alcohol use: Not Currently    Comment: Stopped 30 years ago   Drug use: Never   Sexual activity: Not on file  Other Topics Concern   Not on file  Social History Narrative   Not on file   Social Determinants of Health   Financial Resource Strain: Not on file  Food Insecurity: Not on file  Transportation Needs: No Transportation Needs (11/17/2022)   PRAPARE - Hydrologist (Medical): No    Lack of Transportation (Non-Medical): No  Physical Activity: Not on file  Stress: Not on file  Social Connections: Not on file  Intimate Partner Violence: Not At Risk (11/17/2022)   Humiliation, Afraid, Rape, and Kick questionnaire    Fear of Current or Ex-Partner: No    Emotionally Abused: No    Physically Abused: No    Sexually Abused: No    Family History:   Family History  Problem Relation Age of Onset   Heart disease Mother    Throat cancer Mother    Hypertension Father    Prostate cancer Brother    Prostate cancer Nephew      ROS:  Please see the history of present illness.  All  other ROS reviewed and negative.     Physical Exam/Data:   Vitals:   11/17/22 1957 11/17/22 2346 11/18/22 0401 11/18/22 1033  BP: (!) 163/90 (!) 112/54 (!) 148/90 101/89  Pulse: 71 (!) 51 66 83  Resp: 18   17  Temp: 98 F (36.7 C) 99.5 F (37.5 C)    TempSrc: Oral Oral    SpO2: 100% 100% 100% 97%  Weight: 88.6 kg     Height: '6\' 4"'$  (1.93 m)       Intake/Output Summary (Last 24 hours) at 11/18/2022 1227 Last data filed at 11/18/2022 1000 Gross per 24 hour  Intake 360 ml  Output --  Net 360 ml  11/17/2022    7:57 PM 06/18/2021    8:33 AM 03/10/2021   10:29 AM  Last 3 Weights  Weight (lbs) 195 lb 5.2 oz 190 lb 1.6 oz 207 lb 14.4 oz  Weight (kg) 88.6 kg 86.229 kg 94.303 kg     Body mass index is 23.78 kg/m.  General: Well developed, thin in no acute distress. Head: Normocephalic, atraumatic, sclera non-icteric, no xanthomas, nares are without discharge. Neck: Negative for carotid bruits. JVP not elevated. Lungs: Clear bilaterally to auscultation without wheezes, rales, or rhonchi. Breathing is unlabored. Heart: RRR S1 S2 without murmurs, rubs, or gallops.  Abdomen: Soft, non-tender, non-distended with normoactive bowel sounds. No rebound/guarding. Extremities: No clubbing or cyanosis. No edema. Distal pedal pulses are 2+ and equal bilaterally. Neuro: Alert and oriented X 3. Moves all extremities spontaneously. Psych:  Responds to questions appropriately with a normal affect.   EKG:  The EKG was personally reviewed and demonstrates:  admit atrial flutter 64bpm with 4:1 block with RBBB+LAFB, nonspecific STTW changes. Today: EKG today is more consistent with atrial fib 70bpm with occasional PVC, RBBB/LAFB, nonspecific STTW changes Telemetry:  Telemetry was personally reviewed and demonstrates:  not available  Relevant CV Studies: 2D Echo today   1. Left ventricular ejection fraction, by estimation, is 60 to 65%. The  left ventricle has normal function. The left ventricle  has no regional  wall motion abnormalities. There is mild left ventricular hypertrophy.  Left ventricular diastolic parameters  are indeterminate.   2. Right ventricular systolic function is normal. The right ventricular  size is normal. There is normal pulmonary artery systolic pressure.   3. Left atrial size was mildly dilated.   4. No evidence of mitral valve regurgitation.   5. There is mild calcification of the aortic valve. Aortic valve  regurgitation is trivial. Aortic valve sclerosis is present, with no  evidence of aortic valve stenosis.   6. The inferior vena cava is normal in size with greater than 50%  respiratory variability, suggesting right atrial pressure of 3 mmHg.   Comparison(s): No significant change from prior study.    Laboratory Data:  High Sensitivity Troponin:  No results for input(s): "TROPONINIHS" in the last 720 hours.   Chemistry Recent Labs  Lab 11/17/22 0350 11/17/22 0413 11/17/22 1443 11/18/22 0308  NA 138 139  --  135  K 3.5 3.5  --  3.4*  CL 108 106  --  105  CO2 19*  --   --  24  GLUCOSE 100* 100*  --  90  BUN 13 13  --  13  CREATININE 1.27* 1.20  --  1.10  CALCIUM 8.3*  --   --  8.3*  MG  --   --  1.9  --   GFRNONAA 54*  --   --  >60  ANIONGAP 11  --   --  6    Recent Labs  Lab 11/17/22 0350  PROT 5.9*  ALBUMIN 3.1*  AST 32  ALT 19  ALKPHOS 50  BILITOT 2.0*   Lipids No results for input(s): "CHOL", "TRIG", "HDL", "LABVLDL", "LDLCALC", "CHOLHDL" in the last 168 hours.  Hematology Recent Labs  Lab 11/17/22 0350 11/17/22 0413 11/18/22 0308  WBC 4.2  --  3.2*  RBC 4.71  --  4.79  HGB 14.1 13.9 14.6  HCT 41.4 41.0 41.3  MCV 87.9  --  86.2  MCH 29.9  --  30.5  MCHC 34.1  --  35.4  RDW 13.2  --  13.2  PLT 122*  --  132*   Thyroid No results for input(s): "TSH", "FREET4" in the last 168 hours.  BNPNo results for input(s): "BNP", "PROBNP" in the last 168 hours.  DDimer No results for input(s): "DDIMER" in the last 168  hours.   Radiology/Studies:  ECHOCARDIOGRAM COMPLETE  Result Date: 11/18/2022    ECHOCARDIOGRAM REPORT   Patient Name:   [name] [name] [facility] Date of Exam: 11/18/2022 Medical Rec #:  [id number]         Height:       76.0 in Accession #:    [id number]        Weight:       195.3 lb Date of Birth:  January 03, 1932        BSA:          2.193 m Patient Age:    50 years          BP:           148/90 mmHg Patient Gender: M                 HR:           58 bpm. Exam Location:  Inpatient Procedure: 2D Echo, Cardiac Doppler and Color Doppler Indications:    Atrial Flutter I48.92  History:        Patient has prior history of Echocardiogram examinations, most                 recent 11/15/2019. Covid, Arrythmias:Atrial Fibrillation; Risk                 Factors:Former Smoker and Hypertension.  Sonographer:    Greer Pickerel Referring Phys: [id number] [name] C HATCHER  Sonographer Comments: Suboptimal subcostal window. Image acquisition challenging due to patient body habitus and Image acquisition challenging due to respiratory motion. IMPRESSIONS  1. Left ventricular ejection fraction, by estimation, is 60 to 65%. The left ventricle has normal function. The left ventricle has no regional wall motion abnormalities. There is mild left ventricular hypertrophy. Left ventricular diastolic parameters are indeterminate.  2. Right ventricular systolic function is normal. The right ventricular size is normal. There is normal pulmonary artery systolic pressure.  3. Left atrial size was mildly dilated.  4. No evidence of mitral valve regurgitation.  5. There is mild calcification of the aortic valve. Aortic valve regurgitation is trivial. Aortic valve sclerosis is present, with no evidence of aortic valve stenosis.  6. The inferior vena cava is normal in size with greater than 50% respiratory variability, suggesting right atrial pressure of 3 mmHg. Comparison(s): No significant change from prior study. FINDINGS  Left Ventricle: Left ventricular  ejection fraction, by estimation, is 60 to 65%. The left ventricle has normal function. The left ventricle has no regional wall motion abnormalities. The left ventricular internal cavity size was normal in size. There is  mild left ventricular hypertrophy. Left ventricular diastolic parameters are indeterminate. Right Ventricle: The right ventricular size is normal. Right ventricular systolic function is normal. There is normal pulmonary artery systolic pressure. The tricuspid regurgitant velocity is 1.72 m/s, and with an assumed right atrial pressure of 15 mmHg, the estimated right ventricular systolic pressure is 68.3 mmHg. Left Atrium: Left atrial size was mildly dilated. Right Atrium: Right atrial size was normal in size. Pericardium: There is no evidence of pericardial effusion. Mitral Valve: No evidence of mitral valve regurgitation. Tricuspid Valve: Tricuspid valve regurgitation is not demonstrated. Aortic Valve: There is mild calcification of  the aortic valve. Aortic valve regurgitation is trivial. Aortic valve sclerosis is present, with no evidence of aortic valve stenosis. Pulmonic Valve: Pulmonic valve regurgitation is trivial. Aorta: The aortic root and ascending aorta are structurally normal, with no evidence of dilitation. Venous: The inferior vena cava is normal in size with greater than 50% respiratory variability, suggesting right atrial pressure of 3 mmHg. IAS/Shunts: The interatrial septum was not well visualized.  LEFT VENTRICLE PLAX 2D LVIDd:         4.80 cm LVIDs:         3.20 cm LV PW:         1.30 cm LV IVS:        0.80 cm LVOT diam:     2.20 cm LV SV:         60 LV SV Index:   27 LVOT Area:     3.80 cm  RIGHT VENTRICLE TAPSE (M-mode): 2.3 cm LEFT ATRIUM              Index        RIGHT ATRIUM           Index LA diam:        3.40 cm  1.55 cm/m   RA Area:     19.60 cm LA Vol (A2C):   124.0 ml 56.54 ml/m  RA Volume:   57.60 ml  26.26 ml/m LA Vol (A4C):   85.8 ml  39.12 ml/m LA Biplane Vol:  106.0 ml 48.33 ml/m  AORTIC VALVE             PULMONIC VALVE LVOT Vmax:   82.60 cm/s  PR End Diast Vel: 3.36 msec LVOT Vmean:  50.000 cm/s LVOT VTI:    0.157 m  AORTA Ao Root diam: 3.50 cm Ao Asc diam:  3.50 cm MITRAL VALVE               TRICUSPID VALVE MV Area (PHT): 2.77 cm    TR Peak grad:   11.8 mmHg MV Decel Time: 274 msec    TR Vmax:        172.00 cm/s MV E velocity: 72.30 cm/s MV A velocity: 42.20 cm/s  SHUNTS MV E/A ratio:  1.71        Systemic VTI:  0.16 m                            Systemic Diam: 2.20 cm Phineas Inches Electronically signed by Phineas Inches Signature Date/Time: 11/18/2022/10:21:32 AM    Final    CT Head Wo Contrast  Result Date: 11/17/2022 CLINICAL DATA:  Multiple falls, head and neck trauma. EXAM: CT HEAD WITHOUT CONTRAST CT CERVICAL SPINE WITHOUT CONTRAST TECHNIQUE: Multidetector CT imaging of the head and cervical spine was performed following the standard protocol without intravenous contrast. Multiplanar CT image reconstructions of the cervical spine were also generated. RADIATION DOSE REDUCTION: This exam was performed according to the departmental dose-optimization program which includes automated exposure control, adjustment of the mA and/or kV according to patient size and/or use of iterative reconstruction technique. COMPARISON:  None Available. FINDINGS: CT HEAD FINDINGS Brain: No acute intracranial hemorrhage, midline shift or mass effect. No extra-axial fluid collection. Periventricular white matter hypodensities are present bilaterally. No hydrocephalus. Hypodense region is noted in the cerebellum on the right. Vascular: No hyperdense vessel or unexpected calcification. Skull: No acute fracture.  Hyperostosis frontalis is noted. Sinuses/Orbits: Mild diffuse mucosal thickening in the paranasal  sinuses. No acute orbital abnormality. Other: None. CT CERVICAL SPINE FINDINGS Alignment: Mild anterolisthesis at C2-C3. Skull base and vertebrae: No acute fracture. There is fusion of  the C6-C7 vertebral bodies. Soft tissues and spinal canal: No prevertebral fluid or swelling. No visible canal hematoma. Disc levels: Multilevel intervertebral disc space narrowing, uncovertebral osteophyte formation and facet arthropathy, most pronounced at C5-C6 and C6-C7. Upper chest: No acute abnormality. Other: Carotid artery calcifications. IMPRESSION: 1. No acute intracranial haemorrhage. 2. Vague hypodensity in the right cerebellar hemisphere, possible beam hardening artifact versus infarct. MRI is suggested for further evaluation. 3. Chronic microvascular ischemic changes. 4. Multilevel degenerative changes in the cervical spine without evidence of acute fracture. Electronically Signed   By: Brett Fairy M.D.   On: 11/17/2022 04:40   CT Cervical Spine Wo Contrast  Result Date: 11/17/2022 CLINICAL DATA:  Multiple falls, head and neck trauma. EXAM: CT HEAD WITHOUT CONTRAST CT CERVICAL SPINE WITHOUT CONTRAST TECHNIQUE: Multidetector CT imaging of the head and cervical spine was performed following the standard protocol without intravenous contrast. Multiplanar CT image reconstructions of the cervical spine were also generated. RADIATION DOSE REDUCTION: This exam was performed according to the departmental dose-optimization program which includes automated exposure control, adjustment of the mA and/or kV according to patient size and/or use of iterative reconstruction technique. COMPARISON:  None Available. FINDINGS: CT HEAD FINDINGS Brain: No acute intracranial hemorrhage, midline shift or mass effect. No extra-axial fluid collection. Periventricular white matter hypodensities are present bilaterally. No hydrocephalus. Hypodense region is noted in the cerebellum on the right. Vascular: No hyperdense vessel or unexpected calcification. Skull: No acute fracture.  Hyperostosis frontalis is noted. Sinuses/Orbits: Mild diffuse mucosal thickening in the paranasal sinuses. No acute orbital abnormality. Other:  None. CT CERVICAL SPINE FINDINGS Alignment: Mild anterolisthesis at C2-C3. Skull base and vertebrae: No acute fracture. There is fusion of the C6-C7 vertebral bodies. Soft tissues and spinal canal: No prevertebral fluid or swelling. No visible canal hematoma. Disc levels: Multilevel intervertebral disc space narrowing, uncovertebral osteophyte formation and facet arthropathy, most pronounced at C5-C6 and C6-C7. Upper chest: No acute abnormality. Other: Carotid artery calcifications. IMPRESSION: 1. No acute intracranial haemorrhage. 2. Vague hypodensity in the right cerebellar hemisphere, possible beam hardening artifact versus infarct. MRI is suggested for further evaluation. 3. Chronic microvascular ischemic changes. 4. Multilevel degenerative changes in the cervical spine without evidence of acute fracture. Electronically Signed   By: Brett Fairy M.D.   On: 11/17/2022 04:40   DG Chest Port 1 View  Result Date: 11/17/2022 CLINICAL DATA:  Possible sepsis, fevers EXAM: PORTABLE CHEST 1 VIEW COMPARISON:  12/02/2020 FINDINGS: The heart size and mediastinal contours are within normal limits. Both lungs are clear. The visualized skeletal structures are unremarkable. IMPRESSION: No active disease. Electronically Signed   By: Inez Catalina M.D.   On: 11/17/2022 03:57   DG Pelvis Portable  Result Date: 11/17/2022 CLINICAL DATA:  Sepsis EXAM: PORTABLE PELVIS 1-2 VIEWS COMPARISON:  10/27/2006 FINDINGS: No acute fracture, erosion, or visible bone lesion. Right hip arthroplasty with hypertrophic bone at the trochanteric region. The covered lumbar spine shows disc degeneration with mild scoliosis. Prostate fiducial markers. IMPRESSION: No acute finding. Electronically Signed   By: Jorje Guild M.D.   On: 11/17/2022 03:56     Assessment and Plan:   1. Newly recognized atrial flutter/atrial fibrillation - rate controlled on his own with underlying known conduction disease - CHADSVASC 4 therefore anticoagulation  would be warranted based on this alone but has fall  risk as noted- need to keep in mind he is on paxlovid with drug/drug interactions if [facility] started - per prelim d/w Dr. Audie [name], start Eliquis but will order per pharmacy so they can dose adjust -  since he is largely asymptomatic with this I suspect we will continue with rate based approach - will re-order telemetry for monitoring while inpatient - get TSH with labs  2. Underlying trifascicular block - follow for progressive bradycardia - HR 44-48 by EMS per record review - have re-ordered telemetry for monitoring  3. Chronic HFpEF - volume status stable, not requiring diuretic either now or PTA  4. Frequent falls - pt can recall multiple falls going back to February of this past year including some where he has struck his head - PT eval has been ordered  5. Covid-19 infection, thrombocytopenia, hypokalemia - will give 64mq KCl now otherwise management per primary team, Mg was 1.9 - on Paxlovid  Risk Assessment/Risk Scores:        New York Heart Association (NYHA) Functional Class NYHA Class II  CHA2DS2-VASc Score = 4   This indicates a 4.8% annual risk of stroke. The patient's score is based upon: CHF History: 1 HTN History: 1 Diabetes History: 0 Stroke History: 0 Vascular Disease History: 0 Age Score: 2 Gender Score: 0         For questions or updates, please contact CRolling HillsPlease consult [email or web address] for contact info under    Signed, DCharlie Pitter PA-C  11/18/2022 12:27 PM

## 2022-11-18 NOTE — TOC Initial Note (Signed)
Transition of Care (TOC) - Initial/Assessment Note   Spoke to patient at bedside. Confirmed face sheet information.   Patient from home with wife. Has PCP , transportation etc  PT recommended HHPT . Patient has no preference in agency.   Tommi Rumps with [facility] unable to accept referral.   Claiborne Billings with [facility] accepted  Patient Details  Name: [name] [name] MRN: [id number] Date of Birth: 07-25-1932  Transition of Care [facility]) CM/SW Contact:    Marilu Favre, RN Phone Number: 11/18/2022, 11:58 AM  Clinical Narrative:                   Expected Discharge Plan: [place] Barriers to Discharge: Continued Medical Work up   Patient Goals and CMS Choice   CMS Medicare.gov Compare Post Acute Care list provided to:: Patient Choice offered to / list presented to : Patient      Expected Discharge Plan and Services   Discharge Planning Services: CM Consult Post Acute Care Choice: [place] arrangements for the past 2 months: Apartment                 DME Arranged: N/A DME Agency: NA       HH Arranged: PT          Prior Living Arrangements/Services Living arrangements for the past 2 months: Apartment Lives with:: Spouse Patient language and need for interpreter reviewed:: Yes Do you feel safe going back to the place where you live?: Yes      Need for Family Participation in Patient Care: Yes (Comment) Care giver support system in place?: Yes (comment) Current home services: DME Criminal Activity/Legal Involvement Pertinent to Current Situation/Hospitalization: No - Comment as needed  Activities of Daily Living Home Assistive Devices/Equipment: Dentures (specify type), Walker (specify type) (upper and lower dentures) ADL Screening (condition at time of admission) Patient's cognitive ability adequate to safely complete daily activities?: Yes Is the patient deaf or have difficulty hearing?: No Does the patient have difficulty seeing, even when  wearing glasses/contacts?: No Does the patient have difficulty concentrating, remembering, or making decisions?: No Patient able to express need for assistance with ADLs?: Yes Does the patient have difficulty dressing or bathing?: No Independently performs ADLs?: Yes (appropriate for developmental age) Does the patient have difficulty walking or climbing stairs?: Yes Weakness of Legs: None Weakness of Arms/Hands: None  Permission Sought/Granted   Permission granted to share information with : No              Emotional Assessment Appearance:: Appears stated age Attitude/Demeanor/Rapport: Engaged Affect (typically observed): Accepting Orientation: : Oriented to Self, Oriented to Place, Oriented to  Time, Oriented to Situation Alcohol / Substance Use: Not Applicable Psych Involvement: No (comment)  Admission diagnosis:  Falls frequently [R29.6] Fall, initial encounter [W19.XXXA] Atrial fibrillation, unspecified type ([place]) [I48.91] COVID-19 virus infection [U07.1] Patient Active Problem List   Diagnosis Date Noted   Falls frequently 11/17/2022   Atrial fibrillation ([place]) 11/17/2022   COVID-19 virus infection 11/17/2022   B12 deficiency 03/10/2021   DIZZINESS 08/28/2009   SHOULDER PAIN, LEFT 08/25/2009   FATIGUE 08/25/2009   DIABETES MELLITUS, TYPE II 03/05/2008   HYPOGONADISM, MALE 03/05/2008   CONDUCTIVE HEARING LOSS BILATERAL 03/05/2008   ALLERGIC RHINITIS 03/05/2008   NEOPLASM, MALIGNANT, PROSTATE 09/05/2007   HYPOTHYROIDISM 09/05/2007   HYPERLIPIDEMIA 09/05/2007   ANEMIA-IRON DEFICIENCY 09/05/2007   ANXIETY 09/05/2007   ERECTILE DYSFUNCTION 09/05/2007   DEPRESSION 09/05/2007   COMMON MIGRAINE 09/05/2007  HYPERTENSION 09/05/2007   BRADYCARDIA, CHRONIC 09/05/2007   GERD 09/05/2007   SWELLING MASS OR LUMP IN HEAD AND NECK 09/05/2007   GLYCOSURIA 09/05/2007   DECREASED LIBIDO 09/05/2007   GLUCOSE INTOLERANCE, HX OF 09/05/2007   MITRAL VALVE PROLAPSE, HX OF  09/05/2007   VOCAL CORD POLYP, HX OF 09/05/2007   PCP:  Kristie Cowman, MD Pharmacy:   [id number] - [place], [place] - [address] AT [place] [address] [place] [address] Phone: [phone] Fax: [phone]     Social Determinants of Health (SDOH) Social History: [place]: Low Risk  (11/17/2022)  Transportation Needs: No Transportation Needs (11/17/2022)  Tobacco Use: Medium Risk (11/17/2022)   SDOH Interventions:     Readmission Risk Interventions     No data to display

## 2022-11-18 NOTE — Discharge Instructions (Addendum)
[place],  You were recently admitted to [facility] for falls and COVID.   Continue taking your home medications with the following changes  Start taking Eliquis 2.5 mg twice daily for our atrial flutter Paxlovid 2 pills in the morning and 2 in the evening for 3 more days  CHANGE HOW YOU ARE TAKING Decrease Synthroid dose to 112 mcg a day DO NOT take your Flomax (Tamsulosin) until after you have finished the Paxlovid  Stop taking Lodine and talk to your doctor about safe pain medications to take with Eliquis.   You should seek further medical care if you have recurrent falls, confusion, bleeding that won't stop, feeling lightheaded, or passing out. Also if there are any other bothersome issues call you primary care or come back to the hospital.  We recommend that you see your primary care doctor in about a week to make sure that you continue to improve. We are so glad that you are feeling better.  Sincerely, Johny Blamer, DO

## 2022-11-18 NOTE — Discharge Summary (Signed)
Name: [name] [name] MRN: [id number] DOB: 1932/01/06 89 y.o. PCP: [name] Cowman, MD  Date of Admission: 11/17/2022  3:21 AM Date of Discharge:  11/18/2021 Attending Physician: Dr.  Johnnye [name]  DISCHARGE DIAGNOSIS:  Primary Problem: Falls frequently   Hospital Problems: Principal Problem:   Falls frequently Active Problems:   Atrial fibrillation ([place])   COVID-19 virus infection    DISCHARGE MEDICATIONS:   Allergies as of 11/18/2022       Reactions   Sildenafil Other (See Comments)   Other reaction(s): Blurring of visual image        Medication List     STOP taking these medications    etodolac 400 MG tablet Commonly known as: LODINE       TAKE these medications    acetaminophen 500 MG tablet Commonly known as: TYLENOL Take 500 mg by mouth every 6 (six) hours as needed.   apixaban 2.5 MG Tabs tablet Commonly known as: ELIQUIS Take 1 tablet (2.5 mg total) by mouth 2 (two) times daily.   levothyroxine 112 MCG tablet Commonly known as: Synthroid Take 1 tablet (112 mcg total) by mouth daily. What changed:  medication strength how much to take when to take this   lisinopril 10 MG tablet Commonly known as: ZESTRIL Take 10 mg by mouth daily.   methocarbamol 500 MG tablet Commonly known as: ROBAXIN Take 500 mg by mouth 4 (four) times daily as needed for muscle spasms.   nirmatrelvir/ritonavir (renal dosing) 10 x 150 MG & 10 x '100MG'$  Tabs Commonly known as: PAXLOVID Take 2 tablets by mouth 2 (two) times daily for 3 days. Take nirmatrelvir (150 mg) one tablet twice daily for 5 days and ritonavir (100 mg) one tablet twice daily for 5 days. Start taking on: November 19, 2022   polyethylene glycol 17 g packet Commonly known as: MIRALAX / GLYCOLAX Take 17 g by mouth daily.   tamsulosin 0.4 MG Caps capsule Commonly known as: FLOMAX Take 0.4 mg by mouth daily.        DISPOSITION AND FOLLOW-UP:  [name] [name] was discharged from [facility] in Good condition. At the hospital follow up visit please address:  Follow-up Recommendations: Consults: Follow up with Cardiology on 12/02/2022. Labs:  TSH in 4-6 weeks Medications: Started Eliquis 2.5 mg twice daily, may increase to 5 mg twice daily after completion of Paxlovid.  Synthroid decreased to 112 mcg/day, follow-up TSH in 4 to 6 weeks.  Patient to take Paxlovid for 3 more days.  Patient will not take tamsulosin until he is finished Paxlovid.  Follow-up Appointments:  Follow-up Information     [facility], [place] Follow up.   Specialty: [facility] Contact information: [place] [address] [phone]         Lenna Sciara, NP Follow up.   Specialties: Cardiology, Family Medicine Why: [facility] [place] location - cardiology follow-up arranged on Thursday Dec 02, 2022 at 2:20 PM (Arrive by 2:05 PM). Raquel Sarna is one of the nurse practitioners that works with Dr. Audie Box. Minette Brine information: [address] [place] [address] [place] [address] [phone]                 HOSPITAL COURSE:  Patient Summary: Frequent falls COVID-19 Patient presented after experiencing a fall earlier in the week when carrying food and the food began to slip, causing him to reach to catch the food and ultimately falling. He did not have injuries  from his fall nor did he lose consciousness or strike his head. On arrival to [facility] ED he was found to have a fever to 102 and to be positive for COVID-19 infection which was felt to be cause of fevers and increased weakness and confusion over the last several days, leading to his recent fall. Chest x-ray was negative for acute process and patient did not need supplemental oxygen. CT head was obtained and noted a vague hypodensity in the R cerebellar hemisphere. Neurology input was obtained due to patient inability to have MRI for further assessment (metal in body) and felt that the vague hypodensity was  more likely artifact rather than infarction. He was started on paxlovid for COVID-19 infection. Vitamin B12 was normal, 525. Urinalysis showed small hemoglobin with 20 ketones. Urine culture did not show significant growth. Lactic acid was initially elevated but corrected with IVF. Blood culture shows no growth at time of discharge. Orthostatic vital signs were negative. PT assessed the patient prior to discharge with recommendation of home health PT.   Abnormal EKG Hx of bradycardia, sick sinus syndrome EKG findings of atrial flutter with 4:1 AV block. Patient was hemodynamically stable and asymptomatic. Echocardiogram was obtained and showed LV EF 60-65% with normal function and no regional wall motion abnormalities and mild LVH; normal RV; mildly dilated LA; no mitral valve regurgitation. Magnesium and phosphorous levels were normal. CHA2DS2-VASC score is 4. Cardiology was consulted given new arrhythmia and recommended initiating anticoagulation at reduced dose as patient is on paxlovid. He will follow-up with cardiology as an outpatient.    Hypothyroidism Levothyroxine 137 mcg was continued on admission. TSH was checked and was 0.015, T4 was 1.63, and T3 is pending.   Hypertension Home lisinopril was continued.   Hx of prostate cancer in remission Home tamsulosin was held in setting of paxlovid therapy.   Hyperbilirubinemia Total bilirubin was elevated at 2.0. He had no jaundice or evidence of hemolysis. Remainder of hepatic enzymes were WNL.   Thrombocytopenia Platelet of 122, repeat with improvement to 132.   DISCHARGE INSTRUCTIONS:   Discharge Instructions     Diet - low sodium heart healthy   Complete by: As directed    Increase activity slowly   Complete by: As directed        SUBJECTIVE:  Patient is doing overall well this morning but still has some soreness in both the shoulders after the fall.  There is no decreased strength or any other neurologic symptoms.  There is  chest pain with movement of the shoulder is located primarily in the deltoid.  He did describe a few falls over the past few years.  Majority of them were mechanical and prior to acute COVID illness he only had 2 falls in the past year.  No significant injury from these falls.  Discharge Vitals:   BP 101/89 (BP Location: Left Arm)   Pulse 83   Temp 99.5 F (37.5 C) (Oral)   Resp 17   Ht '6\' 4"'$  (1.93 m) Comment: per pt  Wt 88.6 kg   SpO2 97%   BMI 23.78 kg/m   OBJECTIVE:  Constitutional: Well-appearing elderly male. In no acute distress. HENT: Normocephalic, atraumatic,  Eyes: Sclera non-icteric, EOM intact Cardio:Regular rate and rhythm. No murmurs, rubs, or gallops. 2+ bilateral radial pulses. Pulm:Clear to auscultation bilaterally. Normal work of breathing on room air. XHB:ZJIRCVEL for extremity edema.  Full passive range of motion of both shoulders with some pain in the deltoid musculature but no capsular  pain.  No tenderness over the bicipital groove or pain at the bicipital groove/anywhere with resisted elbow flexion and supination bilaterally. Skin:Warm and dry. Neuro:Alert and oriented x3. No focal deficit noted. Psych:Pleasant mood and affect.   Pertinent Labs, Studies, and Procedures:     Latest Ref Rng & Units 11/18/2022    3:08 AM 11/17/2022    4:13 AM 11/17/2022    3:50 AM  CBC  WBC 4.0 - 10.5 K/uL 3.2   4.2   Hemoglobin 13.0 - 17.0 g/dL 14.6  13.9  14.1   Hematocrit 39.0 - 52.0 % 41.3  41.0  41.4   Platelets 150 - 400 K/uL 132   122        Latest Ref Rng & Units 11/18/2022    3:08 AM 11/17/2022    4:13 AM 11/17/2022    3:50 AM  CMP  Glucose 70 - 99 mg/dL 90  100  100   BUN 8 - 23 mg/dL '13  13  13   '$ Creatinine 0.61 - 1.24 mg/dL 1.10  1.20  1.27   Sodium 135 - 145 mmol/L 135  139  138   Potassium 3.5 - 5.1 mmol/L 3.4  3.5  3.5   Chloride 98 - 111 mmol/L 105  106  108   CO2 22 - 32 mmol/L 24   19   Calcium 8.9 - 10.3 mg/dL 8.3   8.3   Total Protein 6.5 - 8.1  g/dL   5.9   Total Bilirubin 0.3 - 1.2 mg/dL   2.0   Alkaline Phos 38 - 126 U/L   50   AST 15 - 41 U/L   32   ALT 0 - 44 U/L   19     ECHOCARDIOGRAM COMPLETE  Result Date: 11/18/2022    ECHOCARDIOGRAM REPORT   Patient Name:   SRKENYEN CANDY [place] Date of Exam: 11/18/2022 Medical Rec #:  [id number]         Height:       76.0 in Accession #:    [id number]        Weight:       195.3 lb Date of Birth:  Mar 07, 1932        BSA:          2.193 m Patient Age:    40 years          BP:           148/90 mmHg Patient Gender: M                 HR:           58 bpm. Exam Location:  Inpatient Procedure: 2D Echo, Cardiac Doppler and Color Doppler Indications:    Atrial Flutter I48.92  History:        Patient has prior history of Echocardiogram examinations, most                 recent 11/15/2019. Covid, Arrythmias:Atrial Fibrillation; Risk                 Factors:Former Smoker and Hypertension.  Sonographer:    Greer Pickerel Referring Phys: [id number] [name] C HATCHER  Sonographer Comments: Suboptimal subcostal window. Image acquisition challenging due to patient body habitus and Image acquisition challenging due to respiratory motion. IMPRESSIONS  1. Left ventricular ejection fraction, by estimation, is 60 to 65%. The left ventricle has normal function. The left ventricle has no regional wall motion abnormalities. There is mild left ventricular  hypertrophy. Left ventricular diastolic parameters are indeterminate.  2. Right ventricular systolic function is normal. The right ventricular size is normal. There is normal pulmonary artery systolic pressure.  3. Left atrial size was mildly dilated.  4. No evidence of mitral valve regurgitation.  5. There is mild calcification of the aortic valve. Aortic valve regurgitation is trivial. Aortic valve sclerosis is present, with no evidence of aortic valve stenosis.  6. The inferior vena cava is normal in size with greater than 50% respiratory variability, suggesting right atrial pressure of 3  mmHg. Comparison(s): No significant change from prior study. FINDINGS  Left Ventricle: Left ventricular ejection fraction, by estimation, is 60 to 65%. The left ventricle has normal function. The left ventricle has no regional wall motion abnormalities. The left ventricular internal cavity size was normal in size. There is  mild left ventricular hypertrophy. Left ventricular diastolic parameters are indeterminate. Right Ventricle: The right ventricular size is normal. Right ventricular systolic function is normal. There is normal pulmonary artery systolic pressure. The tricuspid regurgitant velocity is 1.72 m/s, and with an assumed right atrial pressure of 15 mmHg, the estimated right ventricular systolic pressure is 76.2 mmHg. Left Atrium: Left atrial size was mildly dilated. Right Atrium: Right atrial size was normal in size. Pericardium: There is no evidence of pericardial effusion. Mitral Valve: No evidence of mitral valve regurgitation. Tricuspid Valve: Tricuspid valve regurgitation is not demonstrated. Aortic Valve: There is mild calcification of the aortic valve. Aortic valve regurgitation is trivial. Aortic valve sclerosis is present, with no evidence of aortic valve stenosis. Pulmonic Valve: Pulmonic valve regurgitation is trivial. Aorta: The aortic root and ascending aorta are structurally normal, with no evidence of dilitation. Venous: The inferior vena cava is normal in size with greater than 50% respiratory variability, suggesting right atrial pressure of 3 mmHg. IAS/Shunts: The interatrial septum was not well visualized.  LEFT VENTRICLE PLAX 2D LVIDd:         4.80 cm LVIDs:         3.20 cm LV PW:         1.30 cm LV IVS:        0.80 cm LVOT diam:     2.20 cm LV SV:         60 LV SV Index:   27 LVOT Area:     3.80 cm  RIGHT VENTRICLE TAPSE (M-mode): 2.3 cm LEFT ATRIUM              Index        RIGHT ATRIUM           Index LA diam:        3.40 cm  1.55 cm/m   RA Area:     19.60 cm LA Vol (A2C):   124.0  ml 56.54 ml/m  RA Volume:   57.60 ml  26.26 ml/m LA Vol (A4C):   85.8 ml  39.12 ml/m LA Biplane Vol: 106.0 ml 48.33 ml/m  AORTIC VALVE             PULMONIC VALVE LVOT Vmax:   82.60 cm/s  PR End Diast Vel: 3.36 msec LVOT Vmean:  50.000 cm/s LVOT VTI:    0.157 m  AORTA Ao Root diam: 3.50 cm Ao Asc diam:  3.50 cm MITRAL VALVE               TRICUSPID VALVE MV Area (PHT): 2.77 cm    TR Peak grad:   11.8 mmHg MV Decel Time: 831  msec    TR Vmax:        172.00 cm/s MV E velocity: 72.30 cm/s MV A velocity: 42.20 cm/s  SHUNTS MV E/A ratio:  1.71        Systemic VTI:  0.16 m                            Systemic Diam: 2.20 cm Phineas Inches Electronically signed by Phineas Inches Signature Date/Time: 11/18/2022/10:21:32 AM    Final    CT Head Wo Contrast  Result Date: 11/17/2022 CLINICAL DATA:  Multiple falls, head and neck trauma. EXAM: CT HEAD WITHOUT CONTRAST CT CERVICAL SPINE WITHOUT CONTRAST TECHNIQUE: Multidetector CT imaging of the head and cervical spine was performed following the standard protocol without intravenous contrast. Multiplanar CT image reconstructions of the cervical spine were also generated. RADIATION DOSE REDUCTION: This exam was performed according to the departmental dose-optimization program which includes automated exposure control, adjustment of the mA and/or kV according to patient size and/or use of iterative reconstruction technique. COMPARISON:  None Available. FINDINGS: CT HEAD FINDINGS Brain: No acute intracranial hemorrhage, midline shift or mass effect. No extra-axial fluid collection. Periventricular white matter hypodensities are present bilaterally. No hydrocephalus. Hypodense region is noted in the cerebellum on the right. Vascular: No hyperdense vessel or unexpected calcification. Skull: No acute fracture.  Hyperostosis frontalis is noted. Sinuses/Orbits: Mild diffuse mucosal thickening in the paranasal sinuses. No acute orbital abnormality. Other: None. CT CERVICAL SPINE FINDINGS  Alignment: Mild anterolisthesis at C2-C3. Skull base and vertebrae: No acute fracture. There is fusion of the C6-C7 vertebral bodies. Soft tissues and spinal canal: No prevertebral fluid or swelling. No visible canal hematoma. Disc levels: Multilevel intervertebral disc space narrowing, uncovertebral osteophyte formation and facet arthropathy, most pronounced at C5-C6 and C6-C7. Upper chest: No acute abnormality. Other: Carotid artery calcifications. IMPRESSION: 1. No acute intracranial haemorrhage. 2. Vague hypodensity in the right cerebellar hemisphere, possible beam hardening artifact versus infarct. MRI is suggested for further evaluation. 3. Chronic microvascular ischemic changes. 4. Multilevel degenerative changes in the cervical spine without evidence of acute fracture. Electronically Signed   By: Brett Fairy M.D.   On: 11/17/2022 04:40   CT Cervical Spine Wo Contrast  Result Date: 11/17/2022 CLINICAL DATA:  Multiple falls, head and neck trauma. EXAM: CT HEAD WITHOUT CONTRAST CT CERVICAL SPINE WITHOUT CONTRAST TECHNIQUE: Multidetector CT imaging of the head and cervical spine was performed following the standard protocol without intravenous contrast. Multiplanar CT image reconstructions of the cervical spine were also generated. RADIATION DOSE REDUCTION: This exam was performed according to the departmental dose-optimization program which includes automated exposure control, adjustment of the mA and/or kV according to patient size and/or use of iterative reconstruction technique. COMPARISON:  None Available. FINDINGS: CT HEAD FINDINGS Brain: No acute intracranial hemorrhage, midline shift or mass effect. No extra-axial fluid collection. Periventricular white matter hypodensities are present bilaterally. No hydrocephalus. Hypodense region is noted in the cerebellum on the right. Vascular: No hyperdense vessel or unexpected calcification. Skull: No acute fracture.  Hyperostosis frontalis is noted.  Sinuses/Orbits: Mild diffuse mucosal thickening in the paranasal sinuses. No acute orbital abnormality. Other: None. CT CERVICAL SPINE FINDINGS Alignment: Mild anterolisthesis at C2-C3. Skull base and vertebrae: No acute fracture. There is fusion of the C6-C7 vertebral bodies. Soft tissues and spinal canal: No prevertebral fluid or swelling. No visible canal hematoma. Disc levels: Multilevel intervertebral disc space narrowing, uncovertebral osteophyte formation and facet arthropathy, most  pronounced at C5-C6 and C6-C7. Upper chest: No acute abnormality. Other: Carotid artery calcifications. IMPRESSION: 1. No acute intracranial haemorrhage. 2. Vague hypodensity in the right cerebellar hemisphere, possible beam hardening artifact versus infarct. MRI is suggested for further evaluation. 3. Chronic microvascular ischemic changes. 4. Multilevel degenerative changes in the cervical spine without evidence of acute fracture. Electronically Signed   By: Brett Fairy M.D.   On: 11/17/2022 04:40   DG Chest Port 1 View  Result Date: 11/17/2022 CLINICAL DATA:  Possible sepsis, fevers EXAM: PORTABLE CHEST 1 VIEW COMPARISON:  12/02/2020 FINDINGS: The heart size and mediastinal contours are within normal limits. Both lungs are clear. The visualized skeletal structures are unremarkable. IMPRESSION: No active disease. Electronically Signed   By: Inez Catalina M.D.   On: 11/17/2022 03:57   DG Pelvis Portable  Result Date: 11/17/2022 CLINICAL DATA:  Sepsis EXAM: PORTABLE PELVIS 1-2 VIEWS COMPARISON:  10/27/2006 FINDINGS: No acute fracture, erosion, or visible bone lesion. Right hip arthroplasty with hypertrophic bone at the trochanteric region. The covered lumbar spine shows disc degeneration with mild scoliosis. Prostate fiducial markers. IMPRESSION: No acute finding. Electronically Signed   By: Jorje Guild M.D.   On: 11/17/2022 03:56     Signed: Johny Blamer, DO Internal Medicine Resident, PGY-1 Pager#  [phone]

## 2022-11-18 NOTE — Progress Notes (Signed)
ANTICOAGULATION CONSULT NOTE - Initial Consult  Pharmacy Consult for Apixaban Indication: atrial fibrillation/atrial flutter  Allergies  Allergen Reactions   Sildenafil Other (See Comments)    Other reaction(s): Blurring of visual image    Patient Measurements: Height: '6\' 4"'$  (193 cm) (per pt) Weight: 88.6 kg (195 lb 5.2 oz) IBW/kg (Calculated) : 86.8  Vital Signs: BP: 101/89 (01/18 1033) Pulse Rate: 83 (01/18 1033)  Labs: Recent Labs    11/17/22 0350 11/17/22 0413 11/18/22 0308  HGB 14.1 13.9 14.6  HCT 41.4 41.0 41.3  PLT 122*  --  132*  APTT 35  --   --   LABPROT 16.8*  --   --   INR 1.4*  --   --   CREATININE 1.27* 1.20 1.10    Estimated Creatinine Clearance: 57 mL/min (by C-G formula based on SCr of 1.1 mg/dL).   Medical History: Past Medical History:  Diagnosis Date   B12 deficiency    Chronic heart failure with preserved ejection fraction (HFpEF) (HCC)    Falls    Hypertension    Hypothyroidism    Iron deficiency anemia    Prostate cancer ([place])    Trifascicular block     Assessment: 89yo male with COVID-19 and newly recognized atrial flutter/atrial fibrillation with CHADSVASC score of 4. PMH: Diabetes (per chart, patient denies), HTN, HFpEF. Patient has had multiple falls (~6 per year).  Plan to start anticoagulation with apixaban at 2.5 mg BID for remainder of Paxlovid (x3 days) due to the drug interaction, then increase dose to apixaban 5 mg BID. However, if concerned about patient's fall risk, could continue 2.5 mg BID.    Goal of Therapy:   Monitor platelets by anticoagulation protocol: Yes   Plan:  Start apixaban 2.5 mg BID  When Paxlovid is complete (1/21), consider increasing apixaban to 5 mg BID.  Monitor for signs/symptoms of bleeding   Berdie Ogren, PharmD Candidate 11/18/2022,1:31 PM

## 2022-11-19 LAB — URINE CULTURE: Culture: 400 — AB

## 2022-11-19 LAB — T3, FREE: T3, Free: 1.8 pg/mL — ABNORMAL LOW (ref 2.0–4.4)

## 2022-11-22 LAB — CULTURE, BLOOD (ROUTINE X 2)
Culture: NO GROWTH
Culture: NO GROWTH
Special Requests: ADEQUATE

## 2022-12-02 ENCOUNTER — Ambulatory Visit: Payer: Medicare Other | Attending: Nurse Practitioner | Admitting: Nurse Practitioner

## 2022-12-02 NOTE — Progress Notes (Deleted)
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  Past Medical History    Past Medical History:  Diagnosis Date   B12 deficiency    Chronic heart failure with preserved ejection fraction (HFpEF) (HCC)    Falls    Hypertension    Hypothyroidism    Iron deficiency anemia    Prostate cancer ([place])    Trifascicular block    Past Surgical History:  Procedure Laterality Date   HIP SURGERY      Allergies  Allergies  Allergen Reactions   Sildenafil Other (See Comments)    Other reaction(s): Blurring of visual image     Labs/Other Studies Reviewed    The following studies were reviewed today: ***  Recent Labs: 11/17/2022: ALT 19; Magnesium 1.9 11/18/2022: BUN 13; Creatinine, Ser 1.10; Hemoglobin 14.6; Platelets 132; Potassium 3.4; Sodium 135; TSH 0.015  Recent Lipid Panel    Component Value Date/Time   CHOL 157 08/25/2009 0000   TRIG 107.0 08/25/2009 0000   HDL 48.00 08/25/2009 0000   CHOLHDL 3 08/25/2009 0000   VLDL 21.4 08/25/2009 0000   [place]kins 88 08/25/2009 0000    History of Present Illness    89 year old male with the above past medical history including 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  He was evaluated in 12/2020 for dizziness.  Echocardiogram was normal.  MPI was normal.  He has noted to have a trifascicular block with ETT in 01/2020  without chronotropic incompetence.  Cardiac monitor was worn but unfortunately did not record any findings.  He was found to be anemic.  He was last seen in the office on 03/02/2021 and was doing well from a cardiac standpoint.  Unfortunately he presented to the ED on 11/17/2022 in the setting of COVID-19 infection.  Cardiology was consulted in the setting of new atrial fibrillation with SVR.  TSH was 0.015.  He was started on Eliquis.  AV nodal agents were not initiated in the setting of underlying conduction disease.  Home health PT was recommended given history of frequent falls.  He was started on Paxlovid and discharged home in stable condition on 11/18/2022.  He presents today for follow-up.  Since his hospitalization  Paroxysmal atrial fibrillation: Trifascicular block: Chronic diastolic heart failure: Hypertension: CKD stage IIIa: Type 2 diabetes: IDA/B12 deficiency: History of falls: Disposition:   Home Medications    Current Outpatient Medications  Medication Sig Dispense Refill   acetaminophen (TYLENOL) 500 MG tablet Take 500 mg by mouth every 6 (six) hours as needed.     apixaban (ELIQUIS) 2.5 MG TABS tablet Take 1 tablet (2.5 mg total) by mouth 2 (two) times daily. 60 tablet 0   levothyroxine (SYNTHROID) 112 MCG tablet Take 1 tablet (112 mcg total) by mouth daily. 30 tablet 11   lisinopril (ZESTRIL) 10 MG tablet Take 10 mg by mouth daily.     methocarbamol (ROBAXIN) 500 MG tablet Take 500 mg by mouth 4 (four) times  daily as needed for muscle spasms.     polyethylene glycol (MIRALAX / GLYCOLAX) 17 g packet Take 17 g by mouth daily.     tamsulosin (FLOMAX) 0.4 MG CAPS capsule Take 0.4 mg by mouth daily.     No current facility-administered medications for this visit.     Review of Systems    ***.  All other systems reviewed and are otherwise negative except as noted above.    Physical Exam    VS:  There were no vitals taken for this visit. , BMI There is no height or  weight on file to calculate BMI.     GEN: Well nourished, well developed, in no acute distress. HEENT: normal. Neck: Supple, no JVD, carotid bruits, or masses. Cardiac: RRR, no murmurs, rubs, or gallops. No clubbing, cyanosis, edema.  Radials/DP/PT 2+ and equal bilaterally.  Respiratory:  Respirations regular and unlabored, clear to auscultation bilaterally. GI: Soft, nontender, nondistended, BS + x 4. MS: no deformity or atrophy. Skin: warm and dry, no rash. Neuro:  Strength and sensation are intact. Psych: Normal affect.  Accessory Clinical Findings    ECG personally reviewed by me today - *** - no acute changes.   Lab Results  Component Value Date   WBC 3.2 (L) 11/18/2022   HGB 14.6 11/18/2022   HCT 41.3 11/18/2022   MCV 86.2 11/18/2022   PLT 132 (L) 11/18/2022   Lab Results  Component Value Date   CREATININE 1.10 11/18/2022   BUN 13 11/18/2022   NA 135 11/18/2022   K 3.4 (L) 11/18/2022   CL 105 11/18/2022   CO2 24 11/18/2022   Lab Results  Component Value Date   ALT 19 11/17/2022   AST 32 11/17/2022   ALKPHOS 50 11/17/2022   BILITOT 2.0 (H) 11/17/2022   Lab Results  Component Value Date   CHOL 157 08/25/2009   HDL 48.00 08/25/2009   LDLCALC 88 08/25/2009   TRIG 107.0 08/25/2009   CHOLHDL 3 08/25/2009    Lab Results  Component Value Date   HGBA1C 5.7 08/25/2009    Assessment & Plan    1.  ***  No BP recorded.  {Refresh Note OR Click here to enter BP  :1}***   Lenna Sciara, NP 12/02/2022, 12:33 PM

## 2023-08-12 ENCOUNTER — Other Ambulatory Visit: Payer: Self-pay

## 2023-08-12 ENCOUNTER — Inpatient Hospital Stay (HOSPITAL_COMMUNITY)
Admission: EM | Admit: 2023-08-12 | Discharge: 2023-08-17 | DRG: 558 | Disposition: A | Discharge status: Skilled Nursing Facility | Payer: Medicare Other | Attending: Internal Medicine | Admitting: Internal Medicine

## 2023-08-12 ENCOUNTER — Emergency Department (HOSPITAL_COMMUNITY): Payer: Medicare Other

## 2023-08-12 ENCOUNTER — Encounter (HOSPITAL_COMMUNITY): Payer: Self-pay

## 2023-08-12 DIAGNOSIS — Z7901 Long term (current) use of anticoagulants: Secondary | ICD-10-CM | POA: Diagnosis not present

## 2023-08-12 DIAGNOSIS — E785 Hyperlipidemia, unspecified: Secondary | ICD-10-CM | POA: Diagnosis present

## 2023-08-12 DIAGNOSIS — M6282 Rhabdomyolysis: Secondary | ICD-10-CM | POA: Diagnosis present

## 2023-08-12 DIAGNOSIS — Z8042 Family history of malignant neoplasm of prostate: Secondary | ICD-10-CM

## 2023-08-12 DIAGNOSIS — I4891 Unspecified atrial fibrillation: Secondary | ICD-10-CM | POA: Diagnosis present

## 2023-08-12 DIAGNOSIS — S0031XA Abrasion of nose, initial encounter: Secondary | ICD-10-CM | POA: Diagnosis present

## 2023-08-12 DIAGNOSIS — F0390 Unspecified dementia without behavioral disturbance: Secondary | ICD-10-CM | POA: Diagnosis present

## 2023-08-12 DIAGNOSIS — Z79899 Other long term (current) drug therapy: Secondary | ICD-10-CM

## 2023-08-12 DIAGNOSIS — Z8249 Family history of ischemic heart disease and other diseases of the circulatory system: Secondary | ICD-10-CM

## 2023-08-12 DIAGNOSIS — Z7982 Long term (current) use of aspirin: Secondary | ICD-10-CM

## 2023-08-12 DIAGNOSIS — E119 Type 2 diabetes mellitus without complications: Secondary | ICD-10-CM | POA: Diagnosis present

## 2023-08-12 DIAGNOSIS — I5032 Chronic diastolic (congestive) heart failure: Secondary | ICD-10-CM | POA: Diagnosis present

## 2023-08-12 DIAGNOSIS — W1830XA Fall on same level, unspecified, initial encounter: Secondary | ICD-10-CM

## 2023-08-12 DIAGNOSIS — N179 Acute kidney failure, unspecified: Secondary | ICD-10-CM | POA: Diagnosis present

## 2023-08-12 DIAGNOSIS — R296 Repeated falls: Secondary | ICD-10-CM | POA: Diagnosis present

## 2023-08-12 DIAGNOSIS — Z79890 Hormone replacement therapy (postmenopausal): Secondary | ICD-10-CM | POA: Diagnosis not present

## 2023-08-12 DIAGNOSIS — I4892 Unspecified atrial flutter: Secondary | ICD-10-CM | POA: Diagnosis present

## 2023-08-12 DIAGNOSIS — Z8616 Personal history of COVID-19: Secondary | ICD-10-CM

## 2023-08-12 DIAGNOSIS — E039 Hypothyroidism, unspecified: Secondary | ICD-10-CM

## 2023-08-12 DIAGNOSIS — E860 Dehydration: Secondary | ICD-10-CM | POA: Diagnosis present

## 2023-08-12 DIAGNOSIS — R17 Unspecified jaundice: Secondary | ICD-10-CM | POA: Diagnosis present

## 2023-08-12 DIAGNOSIS — E861 Hypovolemia: Secondary | ICD-10-CM | POA: Diagnosis present

## 2023-08-12 DIAGNOSIS — Z8546 Personal history of malignant neoplasm of prostate: Secondary | ICD-10-CM

## 2023-08-12 DIAGNOSIS — D751 Secondary polycythemia: Secondary | ICD-10-CM | POA: Diagnosis present

## 2023-08-12 DIAGNOSIS — Z808 Family history of malignant neoplasm of other organs or systems: Secondary | ICD-10-CM

## 2023-08-12 DIAGNOSIS — K7689 Other specified diseases of liver: Secondary | ICD-10-CM | POA: Diagnosis present

## 2023-08-12 DIAGNOSIS — N182 Chronic kidney disease, stage 2 (mild): Secondary | ICD-10-CM | POA: Diagnosis present

## 2023-08-12 DIAGNOSIS — Z86718 Personal history of other venous thrombosis and embolism: Secondary | ICD-10-CM

## 2023-08-12 DIAGNOSIS — I110 Hypertensive heart disease with heart failure: Secondary | ICD-10-CM | POA: Diagnosis present

## 2023-08-12 DIAGNOSIS — R7989 Other specified abnormal findings of blood chemistry: Secondary | ICD-10-CM | POA: Diagnosis present

## 2023-08-12 DIAGNOSIS — E872 Acidosis, unspecified: Secondary | ICD-10-CM | POA: Diagnosis present

## 2023-08-12 DIAGNOSIS — F068 Other specified mental disorders due to known physiological condition: Secondary | ICD-10-CM | POA: Diagnosis present

## 2023-08-12 DIAGNOSIS — Z9049 Acquired absence of other specified parts of digestive tract: Secondary | ICD-10-CM

## 2023-08-12 DIAGNOSIS — Z87891 Personal history of nicotine dependence: Secondary | ICD-10-CM

## 2023-08-12 DIAGNOSIS — W19XXXA Unspecified fall, initial encounter: Secondary | ICD-10-CM

## 2023-08-12 LAB — COMPREHENSIVE METABOLIC PANEL
ALT: 63 U/L — ABNORMAL HIGH (ref 0–44)
AST: 185 U/L — ABNORMAL HIGH (ref 15–41)
Albumin: 3.9 g/dL (ref 3.5–5.0)
Alkaline Phosphatase: 64 U/L (ref 38–126)
Anion gap: 19 — ABNORMAL HIGH (ref 5–15)
BUN: 26 mg/dL — ABNORMAL HIGH (ref 8–23)
CO2: 19 mmol/L — ABNORMAL LOW (ref 22–32)
Calcium: 9.5 mg/dL (ref 8.9–10.3)
Chloride: 98 mmol/L (ref 98–111)
Creatinine, Ser: 1.41 mg/dL — ABNORMAL HIGH (ref 0.61–1.24)
GFR, Estimated: 48 mL/min — ABNORMAL LOW (ref >60)
Glucose, Bld: 95 mg/dL (ref 70–99)
Potassium: 4.7 mmol/L (ref 3.5–5.1)
Sodium: 136 mmol/L (ref 135–145)
Total Bilirubin: 3.7 mg/dL — ABNORMAL HIGH (ref 0.3–1.2)
Total Protein: 7.4 g/dL (ref 6.5–8.1)

## 2023-08-12 LAB — URINALYSIS, W/ REFLEX TO CULTURE (INFECTION SUSPECTED)
Bilirubin Urine: NEGATIVE
Glucose, UA: NEGATIVE mg/dL
Hgb urine dipstick: NEGATIVE
Ketones, ur: 20 mg/dL — AB
Leukocytes,Ua: NEGATIVE
Nitrite: NEGATIVE
Protein, ur: NEGATIVE mg/dL
Specific Gravity, Urine: 1.018 (ref 1.005–1.030)
pH: 5 (ref 5.0–8.0)

## 2023-08-12 LAB — TROPONIN I (HIGH SENSITIVITY)
Troponin I (High Sensitivity): 92 ng/L — ABNORMAL HIGH (ref <18)
Troponin I (High Sensitivity): 84 ng/L — ABNORMAL HIGH (ref <18)

## 2023-08-12 LAB — CBC
HCT: 53.4 % — ABNORMAL HIGH (ref 39.0–52.0)
Hemoglobin: 18.7 g/dL — ABNORMAL HIGH (ref 13.0–17.0)
MCH: 30.8 pg (ref 26.0–34.0)
MCHC: 35 g/dL (ref 30.0–36.0)
MCV: 88 fL (ref 80.0–100.0)
Platelets: 166 10*3/uL (ref 150–400)
RBC: 6.07 MIL/uL — ABNORMAL HIGH (ref 4.22–5.81)
RDW: 13.1 % (ref 11.5–15.5)
WBC: 6.1 10*3/uL (ref 4.0–10.5)
nRBC: 0 % (ref 0.0–0.2)

## 2023-08-12 LAB — GLUCOSE, CAPILLARY: Glucose-Capillary: 93 mg/dL (ref 70–99)

## 2023-08-12 LAB — AMMONIA: Ammonia: 15 umol/L (ref 9–35)

## 2023-08-12 LAB — I-STAT CG4 LACTIC ACID, ED
Lactic Acid, Venous: 2.5 mmol/L (ref 0.5–1.9)
Lactic Acid, Venous: 1.8 mmol/L (ref 0.5–1.9)

## 2023-08-12 LAB — CBG MONITORING, ED: Glucose-Capillary: 100 mg/dL — ABNORMAL HIGH (ref 70–99)

## 2023-08-12 LAB — CK: Total CK: 5591 U/L — ABNORMAL HIGH (ref 49–397)

## 2023-08-12 LAB — TSH: TSH: 0.693 u[IU]/mL (ref 0.350–4.500)

## 2023-08-12 LAB — ETHANOL: Alcohol, Ethyl (B): <10 mg/dL (ref <10)

## 2023-08-12 MED ORDER — IOHEXOL 350 MG/ML SOLN
75.0000 mL | Freq: Once | INTRAVENOUS | Status: AC | PRN
  Administered 2023-08-12: 75 mL via INTRAVENOUS

## 2023-08-12 MED ORDER — LEVOTHYROXINE SODIUM 75 MCG PO TABS
150.0000 ug | ORAL_TABLET | Freq: Every day | ORAL | Status: DC
  Administered 2023-08-13 – 2023-08-17 (×5): 150 ug via ORAL
  Filled 2023-08-12 (×5): qty 2

## 2023-08-12 MED ORDER — ENOXAPARIN SODIUM 40 MG/0.4ML IJ SOSY
40.0000 mg | PREFILLED_SYRINGE | INTRAMUSCULAR | Status: DC
  Administered 2023-08-13 – 2023-08-16 (×5): 40 mg via SUBCUTANEOUS
  Filled 2023-08-12 (×5): qty 0.4

## 2023-08-12 MED ORDER — SODIUM CHLORIDE 0.9 % IV BOLUS
1000.0000 mL | Freq: Once | INTRAVENOUS | Status: AC
  Administered 2023-08-12: 1000 mL via INTRAVENOUS

## 2023-08-12 MED ORDER — LACTATED RINGERS IV SOLN: INTRAVENOUS | Status: AC

## 2023-08-12 MED ORDER — ADULT MULTIVITAMIN W/MINERALS CH
1.0000 | ORAL_TABLET | Freq: Every day | ORAL | Status: DC
  Administered 2023-08-13 – 2023-08-17 (×6): 1 via ORAL
  Filled 2023-08-12 (×6): qty 1

## 2023-08-12 MED ORDER — SODIUM CHLORIDE 0.9% FLUSH
3.0000 mL | Freq: Two times a day (BID) | INTRAVENOUS | Status: DC
  Administered 2023-08-13 – 2023-08-17 (×10): 3 mL via INTRAVENOUS

## 2023-08-12 MED ORDER — TAMSULOSIN HCL 0.4 MG PO CAPS
0.4000 mg | ORAL_CAPSULE | Freq: Every day | ORAL | Status: DC
  Administered 2023-08-13 – 2023-08-17 (×6): 0.4 mg via ORAL
  Filled 2023-08-12 (×6): qty 1

## 2023-08-12 MED ORDER — FOLIC ACID 1 MG PO TABS
1.0000 mg | ORAL_TABLET | Freq: Every day | ORAL | Status: DC
  Administered 2023-08-13 – 2023-08-17 (×6): 1 mg via ORAL
  Filled 2023-08-12 (×6): qty 1

## 2023-08-12 MED ORDER — THIAMINE MONONITRATE 100 MG PO TABS
100.0000 mg | ORAL_TABLET | Freq: Every day | ORAL | Status: DC
  Administered 2023-08-13 – 2023-08-17 (×6): 100 mg via ORAL
  Filled 2023-08-12 (×6): qty 1

## 2023-08-12 NOTE — ED Notes (Addendum)
ED TO INPATIENT HANDOFF REPORT  ED Nurse Name and Phone #: Dahlia Client [phone]  R Name/Age/Gender [name] [name] 89 y.o. male Room/Bed: 041C/041C  Code Status   Code Status: Full Code  Home/SNF/Other Home Patient oriented to: self and place Is this baseline? NO  Triage Complete: Triage complete  Chief Complaint Ground-level fall [W18.30XA]  Triage Note PT arrives via EMS from home. A welfare check was done on patient and his wife. PT was found laying in the floor of his bathroom. Unknown downtime. Pt reports pain to stomach, back, shoulders, and hips. Pt was last seen on Wednesday. Pt arrives AxO to name, dob, and location. He is confused about the date. Unsure if this is the patient's baseline. EMS did place patient in a C-Collar.    Allergies Allergies  Allergen Reactions   Sildenafil Other (See Comments)    Other reaction(s): Blurring of visual image    Level of Care/Admitting Diagnosis ED Disposition     ED Disposition  Admit   Condition  --   Comment  Hospital Area: [facility] [[id number]]  Level of Care: Telemetry Medical [104]  May admit patient to [facility] or [facility] if equivalent level of care is available:: Yes  Covid Evaluation: Asymptomatic - no recent exposure (last 10 days) testing not required  Diagnosis: Ground-level fall [[id number]]  Admitting Physician: Dolly Rias [[id number]]  Attending Physician: Dolly Rias [[id number]]  Certification:: I certify this patient will need inpatient services for at least 2 midnights          B Medical/Surgery History Past Medical History:  Diagnosis Date   B12 deficiency    Chronic heart failure with preserved ejection fraction (HFpEF) (HCC)    Falls    Hypertension    Hypothyroidism    Iron deficiency anemia    Prostate cancer (HCC)    Trifascicular block    Past Surgical History:  Procedure Laterality Date   HIP SURGERY       A IV Location/Drains/Wounds Patient  Lines/Drains/Airways Status     Active Line/Drains/Airways     Name Placement date Placement time Site Days   Peripheral IV 08/12/23 20 G Left Antecubital 08/12/23  1419  Antecubital  less than 1   Peripheral IV 08/12/23 20 G 1" Anterior;Right Forearm 08/12/23  1439  Forearm  less than 1            Intake/Output Last 24 hours  Intake/Output Summary (Last 24 hours) at 08/12/2023 2110 Last data filed at 08/12/2023 1738 Gross per 24 hour  Intake 1000 ml  Output --  Net 1000 ml    Labs/Imaging Results for orders placed or performed during the hospital encounter of 08/12/23 (from the past 48 hour(s))  Comprehensive metabolic panel     Status: Abnormal   Collection Time: 08/12/23  2:26 PM  Result Value Ref Range   Sodium 136 135 - 145 mmol/L   Potassium 4.7 3.5 - 5.1 mmol/L   Chloride 98 98 - 111 mmol/L   CO2 19 (L) 22 - 32 mmol/L   Glucose, Bld 95 70 - 99 mg/dL    Comment: Glucose reference range applies only to samples taken after fasting for at least 8 hours.   BUN 26 (H) 8 - 23 mg/dL   Creatinine, Ser 3.08 (H) 0.61 - 1.24 mg/dL   Calcium 9.5 8.9 - 65.7 mg/dL   Total Protein 7.4 6.5 - 8.1 g/dL   Albumin 3.9 3.5 - 5.0 g/dL   AST  185 (H) 15 - 41 U/L   ALT 63 (H) 0 - 44 U/L   Alkaline Phosphatase 64 38 - 126 U/L   Total Bilirubin 3.7 (H) 0.3 - 1.2 mg/dL   GFR, Estimated 48 (L) >60 mL/min    Comment: (NOTE) Calculated using the CKD-EPI Creatinine Equation (2021)    Anion gap 19 (H) 5 - 15    Comment: Performed at [facility], [address] [place], [place] [address]  CBC     Status: Abnormal   Collection Time: 08/12/23  2:26 PM  Result Value Ref Range   WBC 6.1 4.0 - 10.5 K/uL   RBC 6.07 (H) 4.22 - 5.81 MIL/uL   Hemoglobin 18.7 (H) 13.0 - 17.0 g/dL   HCT 21.3 (H) 08.6 - 57.8 %   MCV 88.0 80.0 - 100.0 fL   MCH 30.8 26.0 - 34.0 pg   MCHC 35.0 30.0 - 36.0 g/dL   RDW 46.9 62.9 - 52.8 %   Platelets 166 150 - 400 K/uL   nRBC 0.0 0.0 - 0.2 %    Comment:  Performed at [facility], [address] [place], [place] [address]  CK     Status: Abnormal   Collection Time: 08/12/23  2:26 PM  Result Value Ref Range   Total CK 5,591 (H) 49 - 397 U/L    Comment: RESULT CONFIRMED BY MANUAL DILUTION Performed at [facility], [address], [place], [place] [address]   CBG monitoring, ED     Status: Abnormal   Collection Time: 08/12/23  2:37 PM  Result Value Ref Range   Glucose-Capillary 100 (H) 70 - 99 mg/dL    Comment: Glucose reference range applies only to samples taken after fasting for at least 8 hours.  I-Stat CG4 Lactic Acid     Status: Abnormal   Collection Time: 08/12/23  2:50 PM  Result Value Ref Range   Lactic Acid, Venous 2.5 (HH) 0.5 - 1.9 mmol/L   Comment NOTIFIED PHYSICIAN   Ethanol     Status: None   Collection Time: 08/12/23  3:49 PM  Result Value Ref Range   Alcohol, Ethyl (B) <10 <10 mg/dL    Comment: (NOTE) Lowest detectable limit for serum alcohol is 10 mg/dL.  For medical purposes only. Performed at [facility], [address], [place], [place] [address]   Troponin I (High Sensitivity)     Status: Abnormal   Collection Time: 08/12/23  3:49 PM  Result Value Ref Range   Troponin I (High Sensitivity) 92 (H) <18 ng/L    Comment: (NOTE) Elevated high sensitivity troponin I (hsTnI) values and significant  changes across serial measurements may suggest ACS but many other  chronic and acute conditions are known to elevate hsTnI results.  Refer to the "Links" section for chest pain algorithms and additional  guidance. Performed at [facility], [address] [place], [place] [address]   Ammonia     Status: None   Collection Time: 08/12/23  3:49 PM  Result Value Ref Range   Ammonia 15 9 - 35 umol/L    Comment: Performed at [facility], [address], [place], [place] [address]  Urinalysis, w/ Reflex to Culture (Infection Suspected) -Urine, Clean Catch     Status: Abnormal    Collection Time: 08/12/23  3:49 PM  Result Value Ref Range   Specimen Source URINE, CLEAN CATCH    Color, Urine YELLOW YELLOW  APPearance CLEAR CLEAR   Specific Gravity, Urine 1.018 1.005 - 1.030   pH 5.0 5.0 - 8.0   Glucose, UA NEGATIVE NEGATIVE mg/dL   Hgb urine dipstick NEGATIVE NEGATIVE   Bilirubin Urine NEGATIVE NEGATIVE   Ketones, ur 20 (A) NEGATIVE mg/dL   Protein, ur NEGATIVE NEGATIVE mg/dL   Nitrite NEGATIVE NEGATIVE   Leukocytes,Ua NEGATIVE NEGATIVE   RBC / HPF 0-5 0 - 5 RBC/hpf   WBC, UA 0-5 0 - 5 WBC/hpf    Comment:        Reflex urine culture not performed if WBC <=10, OR if Squamous epithelial cells >5. If Squamous epithelial cells >5 suggest recollection.    Bacteria, UA RARE (A) NONE SEEN   Squamous Epithelial / HPF 0-5 0 - 5 /HPF   Mucus PRESENT    Hyaline Casts, UA PRESENT     Comment: Performed at [facility], [address] [place], [place] [address]  TSH     Status: None   Collection Time: 08/12/23  5:39 PM  Result Value Ref Range   TSH 0.693 0.350 - 4.500 uIU/mL    Comment: Performed by a 3rd Generation assay with a functional sensitivity of <=0.01 uIU/mL. Performed at [facility], [address], [place], [place] [address]   Troponin I (High Sensitivity)     Status: Abnormal   Collection Time: 08/12/23  5:39 PM  Result Value Ref Range   Troponin I (High Sensitivity) 84 (H) <18 ng/L    Comment: (NOTE) Elevated high sensitivity troponin I (hsTnI) values and significant  changes across serial measurements may suggest ACS but many other  chronic and acute conditions are known to elevate hsTnI results.  Refer to the "Links" section for chest pain algorithms and additional  guidance. Performed at [facility], [address] [place], [place] [address]   I-Stat CG4 Lactic Acid     Status: None   Collection Time: 08/12/23  5:45 PM  Result Value Ref Range   Lactic Acid, Venous 1.8 0.5 - 1.9 mmol/L   CT CHEST ABDOMEN PELVIS W  CONTRAST  Result Date: 08/12/2023 CLINICAL DATA:  Found down. Polytrauma, blunt. Altered mental status. Abdominal pain. EXAM: CT CHEST, ABDOMEN, AND PELVIS WITH CONTRAST TECHNIQUE: Multidetector CT imaging of the chest, abdomen and pelvis was performed following the standard protocol during bolus administration of intravenous contrast. RADIATION DOSE REDUCTION: This exam was performed according to the departmental dose-optimization program which includes automated exposure control, adjustment of the mA and/or kV according to patient size and/or use of iterative reconstruction technique. CONTRAST:  75mL OMNIPAQUE IOHEXOL 350 MG/ML SOLN COMPARISON:  None Available. FINDINGS: CT CHEST FINDINGS Cardiovascular: Heart is normal size. Aorta is normal caliber. Scattered coronary artery and aortic calcifications. Mediastinum/Nodes: No mediastinal, hilar, or axillary adenopathy. Trachea and esophagus are unremarkable. Thyroid unremarkable. Lungs/Pleura: Lungs are clear. No focal airspace opacities or suspicious nodules. No effusions. Musculoskeletal: Chest wall soft tissues are unremarkable. No acute bony abnormality. CT ABDOMEN PELVIS FINDINGS Hepatobiliary: Scattered hypodensities in the liver most compatible with cysts, the largest 2.5 cm in the left hepatic dome. No additional follow-up imaging recommended. Prior cholecystectomy. No biliary ductal dilatation. No perihepatic hematoma. Pancreas: No focal abnormality or ductal dilatation. Spleen: No splenic injury or perisplenic hematoma. Adrenals/Urinary Tract: No adrenal hemorrhage or renal injury identified. Bladder is unremarkable. Stomach/Bowel: Normal appendix. Stomach, large and small bowel grossly unremarkable. Vascular/Lymphatic: Aortic atherosclerosis. No evidence of aneurysm or adenopathy. Reproductive: Radiation seeds in  the region of the prostate. Other: No free fluid or free air. Musculoskeletal: Prior right hip replacement. No acute bony abnormality.  IMPRESSION: No acute findings or evidence of significant traumatic injury in the chest, abdomen or pelvis. Coronary artery disease, aortic atherosclerosis. Electronically Signed   By: Charlett Nose M.D.   On: 08/12/2023 19:28   CT Head Wo Contrast  Result Date: 08/12/2023 CLINICAL DATA:  Neck trauma (Age >= 65y); Head trauma, minor (Age >= 65y). Altered mental status. EXAM: CT HEAD WITHOUT CONTRAST CT CERVICAL SPINE WITHOUT CONTRAST TECHNIQUE: Multidetector CT imaging of the head and cervical spine was performed following the standard protocol without intravenous contrast. Multiplanar CT image reconstructions of the cervical spine were also generated. RADIATION DOSE REDUCTION: This exam was performed according to the departmental dose-optimization program which includes automated exposure control, adjustment of the mA and/or kV according to patient size and/or use of iterative reconstruction technique. COMPARISON:  CT head and cervical spine 11/17/2022. FINDINGS: CT HEAD FINDINGS Brain: No acute intracranial hemorrhage. Gray-white differentiation is preserved. No hydrocephalus or extra-axial collection. No mass effect or midline shift. Vascular: No hyperdense vessel or unexpected calcification. Skull: No calvarial fracture or suspicious bone lesion. Skull base is unremarkable. Sinuses/Orbits: No acute finding. Other: None. CT CERVICAL SPINE FINDINGS Alignment: Normal. Skull base and vertebrae: No acute fracture. Normal craniocervical junction. No suspicious bone lesions. Multilevel degenerative disc height loss with degenerative endplate changes. Acquired fusion of the C6-7 level. Soft tissues and spinal canal: No prevertebral fluid or swelling. No visible canal hematoma. Disc levels: Unchanged multilevel cervical spondylosis, worst at C5-6, where there is at least moderate spinal canal stenosis. Upper chest: No acute findings. Other: Atherosclerotic calcifications of the carotid bulbs. IMPRESSION: 1. No acute  intracranial abnormality. 2. No acute cervical spine fracture or traumatic listhesis. 3. Unchanged multilevel cervical spondylosis, worst at C5-6, where there is at least moderate spinal canal stenosis. Electronically Signed   By: Orvan Falconer M.D.   On: 08/12/2023 19:20   CT Cervical Spine Wo Contrast  Result Date: 08/12/2023 CLINICAL DATA:  Neck trauma (Age >= 65y); Head trauma, minor (Age >= 65y). Altered mental status. EXAM: CT HEAD WITHOUT CONTRAST CT CERVICAL SPINE WITHOUT CONTRAST TECHNIQUE: Multidetector CT imaging of the head and cervical spine was performed following the standard protocol without intravenous contrast. Multiplanar CT image reconstructions of the cervical spine were also generated. RADIATION DOSE REDUCTION: This exam was performed according to the departmental dose-optimization program which includes automated exposure control, adjustment of the mA and/or kV according to patient size and/or use of iterative reconstruction technique. COMPARISON:  CT head and cervical spine 11/17/2022. FINDINGS: CT HEAD FINDINGS Brain: No acute intracranial hemorrhage. Gray-white differentiation is preserved. No hydrocephalus or extra-axial collection. No mass effect or midline shift. Vascular: No hyperdense vessel or unexpected calcification. Skull: No calvarial fracture or suspicious bone lesion. Skull base is unremarkable. Sinuses/Orbits: No acute finding. Other: None. CT CERVICAL SPINE FINDINGS Alignment: Normal. Skull base and vertebrae: No acute fracture. Normal craniocervical junction. No suspicious bone lesions. Multilevel degenerative disc height loss with degenerative endplate changes. Acquired fusion of the C6-7 level. Soft tissues and spinal canal: No prevertebral fluid or swelling. No visible canal hematoma. Disc levels: Unchanged multilevel cervical spondylosis, worst at C5-6, where there is at least moderate spinal canal stenosis. Upper chest: No acute findings. Other: Atherosclerotic  calcifications of the carotid bulbs. IMPRESSION: 1. No acute intracranial abnormality. 2. No acute cervical spine fracture or traumatic listhesis. 3. Unchanged multilevel cervical spondylosis,  worst at C5-6, where there is at least moderate spinal canal stenosis. Electronically Signed   By: Orvan Falconer M.D.   On: 08/12/2023 19:20   DG Chest Port 1 View  Result Date: 08/12/2023 CLINICAL DATA:  Shoulder pain EXAM: PORTABLE CHEST 1 VIEW COMPARISON:  11/17/2022 FINDINGS: The heart size and mediastinal contours are within normal limits. Aortic atherosclerosis. Both lungs are clear. The visualized skeletal structures are unremarkable. IMPRESSION: No active disease. Electronically Signed   By: Jasmine Pang M.D.   On: 08/12/2023 17:56   DG Shoulder Right  Result Date: 08/12/2023 CLINICAL DATA:  Shoulder pain EXAM: RIGHT SHOULDER - 2+ VIEW COMPARISON:  None Available. FINDINGS: No fracture or malalignment. Chronic AC joint widening and mild deformity. Mild degenerative change at the glenohumeral interval. IMPRESSION: No acute osseous abnormality Electronically Signed   By: Jasmine Pang M.D.   On: 08/12/2023 17:55   DG Shoulder Left  Result Date: 08/12/2023 CLINICAL DATA:  Patient was found lying on the floor of his bathroom. Shoulder pain. EXAM: LEFT SHOULDER - 2+ VIEW COMPARISON:  None Available. FINDINGS: No acute fracture or dislocation. Superior subluxation of the humeral head in relation to the glenoid likely due to rotator cuff pathology. Degenerative arthritis AC and glenohumeral joints. IMPRESSION: 1. No acute fracture or dislocation. 2. Findings compatible with rotator cuff pathology. Electronically Signed   By: Minerva Fester M.D.   On: 08/12/2023 17:53    Pending Labs Unresulted Labs (From admission, onward)     Start     Ordered   08/13/23 0500  Basic metabolic panel  Tomorrow morning,   R        08/12/23 2046   08/13/23 0500  CBC  Tomorrow morning,   R        08/12/23 2046    08/13/23 0500  Magnesium  Tomorrow morning,   R        08/12/23 2046   08/13/23 0500  Phosphorus  Tomorrow morning,   R        08/12/23 2046   08/13/23 0500  Hepatic function panel  Tomorrow morning,   R        08/12/23 2046   08/13/23 0500  CK  Tomorrow morning,   R        08/12/23 2046   08/13/23 0500  TSH  Tomorrow morning,   R        08/12/23 2046   08/13/23 0500  Vitamin B12  Tomorrow morning,   R        08/12/23 2057   08/13/23 0500  Hemoglobin A1c  Tomorrow morning,   R        08/12/23 2104            Vitals/Pain Today's Vitals   08/12/23 1815 08/12/23 1937 08/12/23 1937 08/12/23 1938  BP: (!) 170/108 (!) 152/80    Pulse: 65 63    Resp: 19 15    Temp:    97.7 F (36.5 C)  TempSrc:    Oral  SpO2: 97% 100%    Weight:      Height:      PainSc:   6      Isolation Precautions No active isolations  Medications Medications  sodium chloride flush (NS) 0.9 % injection 3 mL (has no administration in time range)  lactated ringers infusion (has no administration in time range)  folic acid (FOLVITE) tablet 1 mg (has no administration in time range)  multivitamin with minerals tablet 1  tablet (has no administration in time range)  thiamine (VITAMIN B1) tablet 100 mg (has no administration in time range)  levothyroxine (SYNTHROID) tablet 150 mcg (has no administration in time range)  tamsulosin (FLOMAX) capsule 0.4 mg (has no administration in time range)  sodium chloride 0.9 % bolus 1,000 mL (0 mLs Intravenous Stopped 08/12/23 1738)  iohexol (OMNIPAQUE) 350 MG/ML injection 75 mL (75 mLs Intravenous Contrast Given 08/12/23 1731)    Mobility walks with device     Focused Assessments   R Recommendations: See Admitting Provider Note  Report given to:   Additional Notes:

## 2023-08-12 NOTE — ED Notes (Signed)
This RN called back to 6N and was told by Arna Medici that bed had arrived and would need to be cleaned.

## 2023-08-12 NOTE — ED Notes (Signed)
This RN received call from pt's son, Layla Gramm., requesting information/update about pt.  This RN notified caller that he did not have the pt for care but helped locate pt in department for primary nurse to update him.  Primary nurse currently updating son with pt's permission.  Ohm Dentler.: [phone]

## 2023-08-12 NOTE — ED Notes (Signed)
Called to make sure IP RN assigned and if they had any questions.  This RN told that no bed in the room and floor would call for one STAT and for this RN to call back in 15 minutes to check.

## 2023-08-12 NOTE — ED Provider Notes (Signed)
[place] EMERGENCY DEPARTMENT AT [facility] Provider Note  CSN: [id number] Arrival date & time: 08/12/23 1407  Chief Complaint(s) Altered Mental Status and Abdominal Pain  HPI [name] [name] is a 89 y.o. male history of CHF, hypertension, hypothyroidism, A-fib on Eliquis presenting to the emergency department with found down.  Apparently welfare check was performed on the patient's home for unclear reason.  He lives with his wife.  EMS found patient lying on the ground.  Patient reports bilateral shoulder pain, abdominal pain,.  He reports he has been falling more but he is unsure why he has been falling.  He reports maybe he had some painful urination.  Not sure if he has had any fevers or chills, difficulty breathing, nausea or vomiting, diarrhea.  History limited due to mild confusion   Past Medical History Past Medical History:  Diagnosis Date   B12 deficiency    Chronic heart failure with preserved ejection fraction (HFpEF) (HCC)    Falls    Hypertension    Hypothyroidism    Iron deficiency anemia    Prostate cancer (HCC)    Trifascicular block    Patient Active Problem List   Diagnosis Date Noted   Ground-level fall 08/12/2023   Falls frequently 11/17/2022   Atrial fibrillation (HCC) 11/17/2022   COVID-19 virus infection 11/17/2022   B12 deficiency 03/10/2021   DIZZINESS 08/28/2009   SHOULDER PAIN, LEFT 08/25/2009   FATIGUE 08/25/2009   Type 2 diabetes mellitus without complication, without long-term current use of insulin (HCC) 03/05/2008   HYPOGONADISM, MALE 03/05/2008   CONDUCTIVE HEARING LOSS BILATERAL 03/05/2008   Allergic rhinitis 03/05/2008   NEOPLASM, MALIGNANT, PROSTATE 09/05/2007   Hypothyroidism 09/05/2007   HYPERLIPIDEMIA 09/05/2007   ANEMIA-IRON DEFICIENCY 09/05/2007   Anxiety state 09/05/2007   ERECTILE DYSFUNCTION 09/05/2007   DEPRESSION 09/05/2007   Migraine without aura 09/05/2007   Essential hypertension 09/05/2007   BRADYCARDIA,  CHRONIC 09/05/2007   GERD 09/05/2007   SWELLING MASS OR LUMP IN HEAD AND NECK 09/05/2007   GLYCOSURIA 09/05/2007   DECREASED LIBIDO 09/05/2007   GLUCOSE INTOLERANCE, HX OF 09/05/2007   History of cardiovascular disorder 09/05/2007   VOCAL CORD POLYP, HX OF 09/05/2007   Home Medication(s) Prior to Admission medications   Medication Sig Start Date End Date Taking? Authorizing Provider  acetaminophen (TYLENOL) 500 MG tablet Take 500 mg by mouth every 6 (six) hours as needed.   Yes [provider]  aspirin EC 81 MG tablet Take 81 mg by mouth daily. Swallow whole.   Yes [provider]  ergocalciferol (VITAMIN D2) 1.25 MG (50000 UT) capsule Take 50,000 Units by mouth every Monday.   Yes [provider]  levothyroxine (SYNTHROID) 150 MCG tablet Take 150 mcg by mouth daily before breakfast.   Yes [provider]  lisinopril (ZESTRIL) 10 MG tablet Take 10 mg by mouth daily.   Yes [provider]  lisinopril (ZESTRIL) 5 MG tablet Take 0.5 tablets by mouth daily.   Yes [provider]  tamsulosin (FLOMAX) 0.4 MG CAPS capsule Take 0.4 mg by mouth daily. 09/24/19  Yes [provider]  apixaban (ELIQUIS) 2.5 MG TABS tablet Take 1 tablet (2.5 mg total) by mouth 2 (two) times daily. 11/18/22   Rocky Morel, DO  Past Surgical History Past Surgical History:  Procedure Laterality Date   HIP SURGERY     Family History Family History  Problem Relation Age of Onset   Heart disease Mother    Throat cancer Mother    Hypertension Father    Prostate cancer Brother    Prostate cancer Nephew     Social History Social History   Tobacco Use   Smoking status: Former    Types: Cigarettes   Smokeless tobacco: Never  Substance Use Topics   Alcohol use: Not Currently    Comment: Stopped 30 years ago   Drug use:  Never   Allergies Sildenafil  Review of Systems Review of Systems  All other systems reviewed and are negative.   Physical Exam Vital Signs  I have reviewed the triage vital signs BP (!) 155/88   Pulse 66   Temp 97.7 F (36.5 C) (Oral)   Resp 16   Ht 6\' 4"  (1.93 m)   Wt 95.3 kg   SpO2 100%   BMI 25.56 kg/m  Physical Exam Vitals and nursing note reviewed.  Constitutional:      General: He is not in acute distress.    Appearance: Normal appearance.  HENT:     Head:     Comments: Abrasion to the nose    Mouth/Throat:     Mouth: Mucous membranes are moist.  Eyes:     Conjunctiva/sclera: Conjunctivae normal.  Cardiovascular:     Rate and Rhythm: Normal rate and regular rhythm.  Pulmonary:     Effort: Pulmonary effort is normal. No respiratory distress.     Breath sounds: Normal breath sounds.  Abdominal:     General: Abdomen is flat.     Palpations: Abdomen is soft.     Tenderness: There is generalized abdominal tenderness.  Musculoskeletal:     Right lower leg: No edema.     Left lower leg: No edema.     Comments: No midline C, T, L-spine tenderness.  Mild painful shoulder range of motion bilaterally but range of motion intact.  No limitation to range motion or tenderness over the bilateral elbows, wrists and hands.  Mild discomfort with range of motion of both hips but no deformity, able to range completely, no tenderness or limitation of range of motion over the bilateral ankles, knees, feet.  Skin:    General: Skin is warm and dry.     Capillary Refill: Capillary refill takes less than 2 seconds.  Neurological:     Mental Status: He is alert.     Comments: Moves all 4 extremities, no cranial nerve deficit.  Oriented to self, that he is in the hospital, does not know which hospital or year.  Knows he is in the hospital for falls  Psychiatric:        Mood and Affect: Mood normal.        Behavior: Behavior normal.     ED Results and Treatments Labs (all labs  ordered are listed, but only abnormal results are displayed) Labs Reviewed  COMPREHENSIVE METABOLIC PANEL - Abnormal; Notable for the following components:      Result Value   CO2 19 (*)    BUN 26 (*)    Creatinine, Ser 1.41 (*)    AST 185 (*)    ALT 63 (*)    Total Bilirubin 3.7 (*)    GFR, Estimated 48 (*)    Anion gap 19 (*)    All other components within normal limits  CBC - Abnormal; Notable for the following components:   RBC 6.07 (*)    Hemoglobin 18.7 (*)    HCT 53.4 (*)    All other components within normal limits  CK - Abnormal; Notable for the following components:   Total CK 5,591 (*)    All other components within normal limits  URINALYSIS, W/ REFLEX TO CULTURE (INFECTION SUSPECTED) - Abnormal; Notable for the following components:   Ketones, ur 20 (*)    Bacteria, UA RARE (*)    All other components within normal limits  CBG MONITORING, ED - Abnormal; Notable for the following components:   Glucose-Capillary 100 (*)    All other components within normal limits  I-STAT CG4 LACTIC ACID, ED - Abnormal; Notable for the following components:   Lactic Acid, Venous 2.5 (*)    All other components within normal limits  TROPONIN I (HIGH SENSITIVITY) - Abnormal; Notable for the following components:   Troponin I (High Sensitivity) 92 (*)    All other components within normal limits  TROPONIN I (HIGH SENSITIVITY) - Abnormal; Notable for the following components:   Troponin I (High Sensitivity) 84 (*)    All other components within normal limits  ETHANOL  TSH  AMMONIA  BASIC METABOLIC PANEL  CBC  MAGNESIUM  PHOSPHORUS  HEPATIC FUNCTION PANEL  CK  TSH  VITAMIN B12  HEMOGLOBIN A1C  BILIRUBIN, DIRECT  I-STAT CG4 LACTIC ACID, ED                                                                                                                          Radiology CT CHEST ABDOMEN PELVIS W CONTRAST  Result Date: 08/12/2023 CLINICAL DATA:  Found down. Polytrauma, blunt.  Altered mental status. Abdominal pain. EXAM: CT CHEST, ABDOMEN, AND PELVIS WITH CONTRAST TECHNIQUE: Multidetector CT imaging of the chest, abdomen and pelvis was performed following the standard protocol during bolus administration of intravenous contrast. RADIATION DOSE REDUCTION: This exam was performed according to the departmental dose-optimization program which includes automated exposure control, adjustment of the mA and/or kV according to patient size and/or use of iterative reconstruction technique. CONTRAST:  75mL OMNIPAQUE IOHEXOL 350 MG/ML SOLN COMPARISON:  None Available. FINDINGS: CT CHEST FINDINGS Cardiovascular: Heart is normal size. Aorta is normal caliber. Scattered coronary artery and aortic calcifications. Mediastinum/Nodes: No mediastinal, hilar, or axillary adenopathy. Trachea and esophagus are unremarkable. Thyroid unremarkable. Lungs/Pleura: Lungs are clear. No focal airspace opacities or suspicious nodules. No effusions. Musculoskeletal: Chest wall soft tissues are unremarkable. No acute bony abnormality. CT ABDOMEN PELVIS FINDINGS Hepatobiliary: Scattered hypodensities in the liver most compatible with cysts, the largest 2.5 cm in the left hepatic dome. No additional follow-up imaging recommended. Prior cholecystectomy. No biliary ductal dilatation. No perihepatic hematoma. Pancreas: No focal abnormality or ductal dilatation. Spleen: No splenic injury or perisplenic hematoma. Adrenals/Urinary Tract: No adrenal hemorrhage or renal injury identified. Bladder is unremarkable. Stomach/Bowel: Normal appendix. Stomach, large and small bowel grossly unremarkable. Vascular/Lymphatic: Aortic atherosclerosis.  No evidence of aneurysm or adenopathy. Reproductive: Radiation seeds in the region of the prostate. Other: No free fluid or free air. Musculoskeletal: Prior right hip replacement. No acute bony abnormality. IMPRESSION: No acute findings or evidence of significant traumatic injury in the chest,  abdomen or pelvis. Coronary artery disease, aortic atherosclerosis. Electronically Signed   By: Charlett Nose M.D.   On: 08/12/2023 19:28   CT Head Wo Contrast  Result Date: 08/12/2023 CLINICAL DATA:  Neck trauma (Age >= 65y); Head trauma, minor (Age >= 65y). Altered mental status. EXAM: CT HEAD WITHOUT CONTRAST CT CERVICAL SPINE WITHOUT CONTRAST TECHNIQUE: Multidetector CT imaging of the head and cervical spine was performed following the standard protocol without intravenous contrast. Multiplanar CT image reconstructions of the cervical spine were also generated. RADIATION DOSE REDUCTION: This exam was performed according to the departmental dose-optimization program which includes automated exposure control, adjustment of the mA and/or kV according to patient size and/or use of iterative reconstruction technique. COMPARISON:  CT head and cervical spine 11/17/2022. FINDINGS: CT HEAD FINDINGS Brain: No acute intracranial hemorrhage. Gray-white differentiation is preserved. No hydrocephalus or extra-axial collection. No mass effect or midline shift. Vascular: No hyperdense vessel or unexpected calcification. Skull: No calvarial fracture or suspicious bone lesion. Skull base is unremarkable. Sinuses/Orbits: No acute finding. Other: None. CT CERVICAL SPINE FINDINGS Alignment: Normal. Skull base and vertebrae: No acute fracture. Normal craniocervical junction. No suspicious bone lesions. Multilevel degenerative disc height loss with degenerative endplate changes. Acquired fusion of the C6-7 level. Soft tissues and spinal canal: No prevertebral fluid or swelling. No visible canal hematoma. Disc levels: Unchanged multilevel cervical spondylosis, worst at C5-6, where there is at least moderate spinal canal stenosis. Upper chest: No acute findings. Other: Atherosclerotic calcifications of the carotid bulbs. IMPRESSION: 1. No acute intracranial abnormality. 2. No acute cervical spine fracture or traumatic listhesis. 3.  Unchanged multilevel cervical spondylosis, worst at C5-6, where there is at least moderate spinal canal stenosis. Electronically Signed   By: Orvan Falconer M.D.   On: 08/12/2023 19:20   CT Cervical Spine Wo Contrast  Result Date: 08/12/2023 CLINICAL DATA:  Neck trauma (Age >= 65y); Head trauma, minor (Age >= 65y). Altered mental status. EXAM: CT HEAD WITHOUT CONTRAST CT CERVICAL SPINE WITHOUT CONTRAST TECHNIQUE: Multidetector CT imaging of the head and cervical spine was performed following the standard protocol without intravenous contrast. Multiplanar CT image reconstructions of the cervical spine were also generated. RADIATION DOSE REDUCTION: This exam was performed according to the departmental dose-optimization program which includes automated exposure control, adjustment of the mA and/or kV according to patient size and/or use of iterative reconstruction technique. COMPARISON:  CT head and cervical spine 11/17/2022. FINDINGS: CT HEAD FINDINGS Brain: No acute intracranial hemorrhage. Gray-white differentiation is preserved. No hydrocephalus or extra-axial collection. No mass effect or midline shift. Vascular: No hyperdense vessel or unexpected calcification. Skull: No calvarial fracture or suspicious bone lesion. Skull base is unremarkable. Sinuses/Orbits: No acute finding. Other: None. CT CERVICAL SPINE FINDINGS Alignment: Normal. Skull base and vertebrae: No acute fracture. Normal craniocervical junction. No suspicious bone lesions. Multilevel degenerative disc height loss with degenerative endplate changes. Acquired fusion of the C6-7 level. Soft tissues and spinal canal: No prevertebral fluid or swelling. No visible canal hematoma. Disc levels: Unchanged multilevel cervical spondylosis, worst at C5-6, where there is at least moderate spinal canal stenosis. Upper chest: No acute findings. Other: Atherosclerotic calcifications of the carotid bulbs. IMPRESSION: 1. No acute intracranial abnormality. 2.  No acute cervical  spine fracture or traumatic listhesis. 3. Unchanged multilevel cervical spondylosis, worst at C5-6, where there is at least moderate spinal canal stenosis. Electronically Signed   By: Orvan Falconer M.D.   On: 08/12/2023 19:20   DG Chest Port 1 View  Result Date: 08/12/2023 CLINICAL DATA:  Shoulder pain EXAM: PORTABLE CHEST 1 VIEW COMPARISON:  11/17/2022 FINDINGS: The heart size and mediastinal contours are within normal limits. Aortic atherosclerosis. Both lungs are clear. The visualized skeletal structures are unremarkable. IMPRESSION: No active disease. Electronically Signed   By: Jasmine Pang M.D.   On: 08/12/2023 17:56   DG Shoulder Right  Result Date: 08/12/2023 CLINICAL DATA:  Shoulder pain EXAM: RIGHT SHOULDER - 2+ VIEW COMPARISON:  None Available. FINDINGS: No fracture or malalignment. Chronic AC joint widening and mild deformity. Mild degenerative change at the glenohumeral interval. IMPRESSION: No acute osseous abnormality Electronically Signed   By: Jasmine Pang M.D.   On: 08/12/2023 17:55   DG Shoulder Left  Result Date: 08/12/2023 CLINICAL DATA:  Patient was found lying on the floor of his bathroom. Shoulder pain. EXAM: LEFT SHOULDER - 2+ VIEW COMPARISON:  None Available. FINDINGS: No acute fracture or dislocation. Superior subluxation of the humeral head in relation to the glenoid likely due to rotator cuff pathology. Degenerative arthritis AC and glenohumeral joints. IMPRESSION: 1. No acute fracture or dislocation. 2. Findings compatible with rotator cuff pathology. Electronically Signed   By: Minerva Fester M.D.   On: 08/12/2023 17:53    Pertinent labs & imaging results that were available during my care of the patient were reviewed by me and considered in my medical decision making (see MDM for details).  Medications Ordered in ED Medications  sodium chloride flush (NS) 0.9 % injection 3 mL (has no administration in time range)  lactated ringers infusion  (has no administration in time range)  folic acid (FOLVITE) tablet 1 mg (has no administration in time range)  multivitamin with minerals tablet 1 tablet (has no administration in time range)  thiamine (VITAMIN B1) tablet 100 mg (has no administration in time range)  levothyroxine (SYNTHROID) tablet 150 mcg (has no administration in time range)  tamsulosin (FLOMAX) capsule 0.4 mg (has no administration in time range)  enoxaparin (LOVENOX) injection 40 mg (has no administration in time range)  sodium chloride 0.9 % bolus 1,000 mL (0 mLs Intravenous Stopped 08/12/23 1738)  iohexol (OMNIPAQUE) 350 MG/ML injection 75 mL (75 mLs Intravenous Contrast Given 08/12/23 1731)                                                                                                                                     Procedures Procedures  (including critical care time)  Medical Decision Making / ED Course   MDM:  89 year old presenting to the emergency department with falls.  Patient history limited due to patient not really remembering why he has been falling.  Appears dehydrated.  Unknown time he was on the ground.  He does report some urinary discomfort.  He has some painful range of motion of both shoulders and hips without clear deformity.  Will check labs, also obtain toxic/metabolic workup, check urinalysis.  No fever in the emergency department.  Given abdominal pain, will obtain CT abdomen.  Given falls will extend this to CT chest.  Will also obtain CT head and neck as he does have an abrasion on his nose suggestive of a fall.  No appreciable neurologic deficits.  Given patient was found down, have been having frequent falls, seems somewhat confused with unclear baseline, anticipate he will need to admitted to to the hospital.  Clinical Course as of 08/12/23 2132  Fri Aug 12, 2023  2130 Workup notable for signs of rhabdomyolysis.  CT scans with no signs of traumatic injury.  Patient received IV  fluids.  He also has an elevated troponin which is stable, denies chest pain, unclear cause, possibly demand related.  Discussed with the hospitalist to admit the patient for further management. [WS]    Clinical Course User Index [WS] Lonell Grandchild, MD     Additional history obtained: -Additional history obtained from ems  Lab Tests: -I ordered, reviewed, and interpreted labs.   The pertinent results include:   Labs Reviewed  COMPREHENSIVE METABOLIC PANEL - Abnormal; Notable for the following components:      Result Value   CO2 19 (*)    BUN 26 (*)    Creatinine, Ser 1.41 (*)    AST 185 (*)    ALT 63 (*)    Total Bilirubin 3.7 (*)    GFR, Estimated 48 (*)    Anion gap 19 (*)    All other components within normal limits  CBC - Abnormal; Notable for the following components:   RBC 6.07 (*)    Hemoglobin 18.7 (*)    HCT 53.4 (*)    All other components within normal limits  CK - Abnormal; Notable for the following components:   Total CK 5,591 (*)    All other components within normal limits  URINALYSIS, W/ REFLEX TO CULTURE (INFECTION SUSPECTED) - Abnormal; Notable for the following components:   Ketones, ur 20 (*)    Bacteria, UA RARE (*)    All other components within normal limits  CBG MONITORING, ED - Abnormal; Notable for the following components:   Glucose-Capillary 100 (*)    All other components within normal limits  I-STAT CG4 LACTIC ACID, ED - Abnormal; Notable for the following components:   Lactic Acid, Venous 2.5 (*)    All other components within normal limits  TROPONIN I (HIGH SENSITIVITY) - Abnormal; Notable for the following components:   Troponin I (High Sensitivity) 92 (*)    All other components within normal limits  TROPONIN I (HIGH SENSITIVITY) - Abnormal; Notable for the following components:   Troponin I (High Sensitivity) 84 (*)    All other components within normal limits  ETHANOL  TSH  AMMONIA  BASIC METABOLIC PANEL  CBC  MAGNESIUM   PHOSPHORUS  HEPATIC FUNCTION PANEL  CK  TSH  VITAMIN B12  HEMOGLOBIN A1C  BILIRUBIN, DIRECT  I-STAT CG4 LACTIC ACID, ED    Notable for elevated lactate, elevated troponin, elevated CK, AKI  EKG   EKG Interpretation Date/Time:  Friday August 12 2023 15:45:16 EDT Ventricular Rate:  66 PR Interval:  325 QRS Duration:  134 QT Interval:  457 QTC Calculation: 479 R Axis:   -  37  Text Interpretation: Atrial flutter Consider left atrial enlargement IVCD, consider atypical RBBB LVH with secondary repolarization abnormality Confirmed by Alvino Blood ([id number]) on 08/12/2023 4:04:02 PM         Imaging Studies ordered: I ordered imaging studies including CT scans On my interpretation imaging demonstrates no traumatic injury I independently visualized and interpreted imaging. I agree with the radiologist interpretation   Medicines ordered and prescription drug management: Meds ordered this encounter  Medications   sodium chloride 0.9 % bolus 1,000 mL   iohexol (OMNIPAQUE) 350 MG/ML injection 75 mL   sodium chloride flush (NS) 0.9 % injection 3 mL   lactated ringers infusion   folic acid (FOLVITE) tablet 1 mg   multivitamin with minerals tablet 1 tablet   thiamine (VITAMIN B1) tablet 100 mg   levothyroxine (SYNTHROID) tablet 150 mcg   tamsulosin (FLOMAX) capsule 0.4 mg   enoxaparin (LOVENOX) injection 40 mg    -I have reviewed the patients home medicines and have made adjustments as needed   Consultations Obtained: I requested consultation with the hospitalist,  and discussed lab and imaging findings as well as pertinent plan - they recommend: admission   Cardiac Monitoring: The patient was maintained on a cardiac monitor.  I personally viewed and interpreted the cardiac monitored which showed an underlying rhythm of: atrial flutter   Reevaluation: After the interventions noted above, I reevaluated the patient and found that their symptoms have improved  Co  morbidities that complicate the patient evaluation  Past Medical History:  Diagnosis Date   B12 deficiency    Chronic heart failure with preserved ejection fraction (HFpEF) (HCC)    Falls    Hypertension    Hypothyroidism    Iron deficiency anemia    Prostate cancer (HCC)    Trifascicular block       Dispostion: Disposition decision including need for hospitalization was considered, and patient admitted to the hospital.    Final Clinical Impression(s) / ED Diagnoses Final diagnoses:  Non-traumatic rhabdomyolysis  Fall, initial encounter  Dehydration     This chart was dictated using voice recognition software.  Despite best efforts to proofread,  errors can occur which can change the documentation meaning.    Lonell Grandchild, MD 08/12/23 2132

## 2023-08-12 NOTE — ED Triage Notes (Signed)
PT arrives via EMS from home. A welfare check was done on patient and his wife. PT was found laying in the floor of his bathroom. Unknown downtime. Pt reports pain to stomach, back, shoulders, and hips. Pt was last seen on Wednesday. Pt arrives [facility] to name, dob, and location. He is confused about the date. Unsure if this is the patient's baseline. EMS did place patient in a C-Collar.

## 2023-08-12 NOTE — H&P (Addendum)
History and Physical    [name] [name] XBJ:[id number] DOB: 1932-07-11 DOA: 08/12/2023  PCP: Knox Royalty, MD   Patient coming from: Home   Chief Complaint:  Chief Complaint  Patient presents with   Altered Mental Status   Abdominal Pain    HPI: History is limited due to patient's altered mental status, provided by patient, ED provider. Attempted to call wife for collateral but no answer.  [name] [name] is a 89 y.o. male with hx of A-fib on anticoagulation, diabetes, hypertension, hyperlipidemia, hypothyroidism, HFpEF, prostate cancer, prior DVT, who was brought in by EMS after wellness check.  Upon EMS arrival to his home found down on the ground.  Patient does not recall events leading up to his fall.  Thinks he may have fallen on Wednesday.  Denies any recent illness.  Mainly complains of pain along his right lower chest and flank.  No other sites of pain.  No other complaints.   Review of Systems:  ROS complete and negative except as marked above   Allergies  Allergen Reactions   Sildenafil Other (See Comments)    Other reaction(s): Blurring of visual image    Prior to Admission medications   Medication Sig Start Date End Date Taking? Authorizing Provider  acetaminophen (TYLENOL) 500 MG tablet Take 500 mg by mouth every 6 (six) hours as needed.   Yes [provider]  aspirin EC 81 MG tablet Take 81 mg by mouth daily. Swallow whole.   Yes [provider]  ergocalciferol (VITAMIN D2) 1.25 MG (50000 UT) capsule Take 50,000 Units by mouth every Monday.   Yes [provider]  levothyroxine (SYNTHROID) 150 MCG tablet Take 150 mcg by mouth daily before breakfast.   Yes [provider]  lisinopril (ZESTRIL) 10 MG tablet Take 10 mg by mouth daily.   Yes [provider]  lisinopril (ZESTRIL) 5 MG tablet Take 0.5 tablets by mouth daily.   Yes [provider]  tamsulosin (FLOMAX) 0.4 MG CAPS capsule Take 0.4 mg by mouth daily.  09/24/19  Yes [provider]  apixaban (ELIQUIS) 2.5 MG TABS tablet Take 1 tablet (2.5 mg total) by mouth 2 (two) times daily. 11/18/22   Rocky Morel, DO    Past Medical History:  Diagnosis Date   B12 deficiency    Chronic heart failure with preserved ejection fraction (HFpEF) (HCC)    Falls    Hypertension    Hypothyroidism    Iron deficiency anemia    Prostate cancer (HCC)    Trifascicular block     Past Surgical History:  Procedure Laterality Date   HIP SURGERY       reports that he has quit smoking. He has never used smokeless tobacco. He reports that he does not currently use alcohol. He reports that he does not use drugs.  Family History  Problem Relation Age of Onset   Heart disease Mother    Throat cancer Mother    Hypertension Father    Prostate cancer Brother    Prostate cancer Nephew      Physical Exam: Vitals:   08/12/23 1805 08/12/23 1815 08/12/23 1937 08/12/23 1938  BP: (!) 149/97 (!) 170/108 (!) 152/80   Pulse: 65 65 63   Resp: 16 19 15    Temp:    97.7 F (36.5 C)  TempSrc:    Oral  SpO2: 100% 97% 100%   Weight:      Height:        Gen: Awake,  alert, NAD, well-nourished HEENT: Abrasion over the nose CV: Regular, normal S1, S2, 1/6 SEM  resp: Normal WOB, CTAB anteriorly Abd: Flat, normoactive, nontender MSK: Right ribs and chest wall nontender.  Limited range of motion in the shoulders but painless.  Ranges lower extremities without pain.  No edema Skin: See HEENT.  No other rashes or lesions to exposed skin Neuro: Alert and interactive, oriented to self, [facility].  But not to [facility], time, situation.  CN II through XII intact.  Motor is 5 out of 5 and symmetric, sensation is intact and equal to fine touch. Psych: euthymic, appropriate    Data review:   Labs reviewed, notable for:   Lactate 2.5, down to 1.8 Bicarb 19, anion gap 19 Creatinine 1.4, baseline 1.1 T. bili 3.7, AST 185, ALT 63 CK 5591 High-sensitivity Trop  92 down to 84 Hemoglobin 18 hematocrit 53  Micro:  Results for orders placed or performed during the hospital encounter of 11/17/22  Blood Culture (routine x 2)     Status: None   Collection Time: 11/17/22  3:50 AM   Specimen: BLOOD RIGHT FOREARM  Result Value Ref Range Status   Specimen Description BLOOD RIGHT FOREARM  Final   Special Requests   Final    BOTTLES DRAWN AEROBIC AND ANAEROBIC Blood Culture adequate volume   Culture   Final    NO GROWTH 5 DAYS Performed at [facility], [address] [place], [place] [address]    Report Status 11/22/2022 FINAL  Final  Resp panel by RT-PCR (RSV, Flu A&B, Covid)     Status: Abnormal   Collection Time: 11/17/22  3:50 AM   Specimen: Nasal Swab  Result Value Ref Range Status   SARS Coronavirus 2 by RT PCR POSITIVE (A) NEGATIVE Final    Comment: (NOTE) SARS-CoV-2 target nucleic acids are DETECTED.  The SARS-CoV-2 RNA is generally detectable in upper respiratory specimens during the acute phase of infection. Positive results are indicative of the presence of the identified virus, but do not rule out bacterial infection or co-infection with other pathogens not detected by the test. Clinical correlation with patient history and other diagnostic information is necessary to determine patient infection status. The expected result is Negative.  Fact Sheet for Patients: BloggerCourse.com  Fact Sheet for Healthcare Providers: SeriousBroker.it  This test is not yet approved or cleared by the [place] FDA and  has been authorized for detection and/or diagnosis of SARS-CoV-2 by FDA under an Emergency Use Authorization (EUA).  This EUA will remain in effect (meaning this test can be used) for the duration of  the COVID-19 declaration under Section 564(b)(1) of the A ct, 21 U.S.C. section 360bbb-3(b)(1), unless the authorization is terminated or revoked sooner.     Influenza A by  PCR NEGATIVE NEGATIVE Final   Influenza B by PCR NEGATIVE NEGATIVE Final    Comment: (NOTE) The Xpert Xpress SARS-CoV-2/FLU/RSV plus assay is intended as an aid in the diagnosis of influenza from Nasopharyngeal swab specimens and should not be used as a sole basis for treatment. Nasal washings and aspirates are unacceptable for Xpert Xpress SARS-CoV-2/FLU/RSV testing.  Fact Sheet for Patients: BloggerCourse.com  Fact Sheet for Healthcare Providers: SeriousBroker.it  This test is not yet approved or cleared by the [place] FDA and has been authorized for detection and/or diagnosis of SARS-CoV-2 by FDA under an Emergency Use Authorization (EUA). This EUA will remain in effect (meaning this test can be used) for the duration of  the COVID-19 declaration under Section 564(b)(1) of the Act, 21 U.S.C. section 360bbb-3(b)(1), unless the authorization is terminated or revoked.     Resp Syncytial Virus by PCR NEGATIVE NEGATIVE Final    Comment: (NOTE) Fact Sheet for Patients: BloggerCourse.com  Fact Sheet for Healthcare Providers: SeriousBroker.it  This test is not yet approved or cleared by the [place] FDA and has been authorized for detection and/or diagnosis of SARS-CoV-2 by FDA under an Emergency Use Authorization (EUA). This EUA will remain in effect (meaning this test can be used) for the duration of the COVID-19 declaration under Section 564(b)(1) of the Act, 21 U.S.C. section 360bbb-3(b)(1), unless the authorization is terminated or revoked.  Performed at [facility], [address], [place], [place] [address]   Urine Culture     Status: Abnormal   Collection Time: 11/17/22  6:40 AM   Specimen: In/Out Cath Urine  Result Value Ref Range Status   Specimen Description IN/OUT CATH URINE  Final   Special Requests   Final    NONE Performed at [facility], [address], [place], [place] [address]    Culture 400 COLONIES/mL STAPHYLOCOCCUS LUGDUNENSIS (A)  Final   Report Status 11/19/2022 FINAL  Final   Organism ID, Bacteria STAPHYLOCOCCUS LUGDUNENSIS (A)  Final      Susceptibility   Staphylococcus lugdunensis - MIC*    CIPROFLOXACIN <=0.5 SENSITIVE Sensitive     GENTAMICIN <=0.5 SENSITIVE Sensitive     NITROFURANTOIN <=16 SENSITIVE Sensitive     OXACILLIN 1 SENSITIVE Sensitive     TETRACYCLINE <=1 SENSITIVE Sensitive     VANCOMYCIN <=0.5 SENSITIVE Sensitive     TRIMETH/SULFA <=10 SENSITIVE Sensitive     CLINDAMYCIN <=0.25 SENSITIVE Sensitive     RIFAMPIN <=0.5 SENSITIVE Sensitive     Inducible Clindamycin NEGATIVE Sensitive     * 400 COLONIES/mL STAPHYLOCOCCUS LUGDUNENSIS  Blood Culture (routine x 2)     Status: None   Collection Time: 11/17/22  2:45 PM   Specimen: BLOOD LEFT HAND  Result Value Ref Range Status   Specimen Description BLOOD LEFT HAND  Final   Special Requests   Final    BOTTLES DRAWN AEROBIC AND ANAEROBIC Blood Culture results may not be optimal due to an inadequate volume of blood received in culture bottles   Culture   Final    NO GROWTH 5 DAYS Performed at [facility], [address], [place], [place] [address]    Report Status 11/22/2022 FINAL  Final    Imaging reviewed:  CT CHEST ABDOMEN PELVIS W CONTRAST  Result Date: 08/12/2023 CLINICAL DATA:  Found down. Polytrauma, blunt. Altered mental status. Abdominal pain. EXAM: CT CHEST, ABDOMEN, AND PELVIS WITH CONTRAST TECHNIQUE: Multidetector CT imaging of the chest, abdomen and pelvis was performed following the standard protocol during bolus administration of intravenous contrast. RADIATION DOSE REDUCTION: This exam was performed according to the departmental dose-optimization program which includes automated exposure control, adjustment of the mA and/or kV according to patient size and/or use of iterative reconstruction technique. CONTRAST:  75mL  OMNIPAQUE IOHEXOL 350 MG/ML SOLN COMPARISON:  None Available. FINDINGS: CT CHEST FINDINGS Cardiovascular: Heart is normal size. Aorta is normal caliber. Scattered coronary artery and aortic calcifications. Mediastinum/Nodes: No mediastinal, hilar, or axillary adenopathy. Trachea and esophagus are unremarkable. Thyroid unremarkable. Lungs/Pleura: Lungs are clear. No focal airspace opacities or suspicious nodules. No effusions. Musculoskeletal: Chest wall soft tissues are unremarkable. No acute bony abnormality. CT ABDOMEN PELVIS FINDINGS Hepatobiliary:  Scattered hypodensities in the liver most compatible with cysts, the largest 2.5 cm in the left hepatic dome. No additional follow-up imaging recommended. Prior cholecystectomy. No biliary ductal dilatation. No perihepatic hematoma. Pancreas: No focal abnormality or ductal dilatation. Spleen: No splenic injury or perisplenic hematoma. Adrenals/Urinary Tract: No adrenal hemorrhage or renal injury identified. Bladder is unremarkable. Stomach/Bowel: Normal appendix. Stomach, large and small bowel grossly unremarkable. Vascular/Lymphatic: Aortic atherosclerosis. No evidence of aneurysm or adenopathy. Reproductive: Radiation seeds in the region of the prostate. Other: No free fluid or free air. Musculoskeletal: Prior right hip replacement. No acute bony abnormality. IMPRESSION: No acute findings or evidence of significant traumatic injury in the chest, abdomen or pelvis. Coronary artery disease, aortic atherosclerosis. Electronically Signed   By: Charlett Nose M.D.   On: 08/12/2023 19:28   CT Head Wo Contrast  Result Date: 08/12/2023 CLINICAL DATA:  Neck trauma (Age >= 65y); Head trauma, minor (Age >= 65y). Altered mental status. EXAM: CT HEAD WITHOUT CONTRAST CT CERVICAL SPINE WITHOUT CONTRAST TECHNIQUE: Multidetector CT imaging of the head and cervical spine was performed following the standard protocol without intravenous contrast. Multiplanar CT image  reconstructions of the cervical spine were also generated. RADIATION DOSE REDUCTION: This exam was performed according to the departmental dose-optimization program which includes automated exposure control, adjustment of the mA and/or kV according to patient size and/or use of iterative reconstruction technique. COMPARISON:  CT head and cervical spine 11/17/2022. FINDINGS: CT HEAD FINDINGS Brain: No acute intracranial hemorrhage. Gray-white differentiation is preserved. No hydrocephalus or extra-axial collection. No mass effect or midline shift. Vascular: No hyperdense vessel or unexpected calcification. Skull: No calvarial fracture or suspicious bone lesion. Skull base is unremarkable. Sinuses/Orbits: No acute finding. Other: None. CT CERVICAL SPINE FINDINGS Alignment: Normal. Skull base and vertebrae: No acute fracture. Normal craniocervical junction. No suspicious bone lesions. Multilevel degenerative disc height loss with degenerative endplate changes. Acquired fusion of the C6-7 level. Soft tissues and spinal canal: No prevertebral fluid or swelling. No visible canal hematoma. Disc levels: Unchanged multilevel cervical spondylosis, worst at C5-6, where there is at least moderate spinal canal stenosis. Upper chest: No acute findings. Other: Atherosclerotic calcifications of the carotid bulbs. IMPRESSION: 1. No acute intracranial abnormality. 2. No acute cervical spine fracture or traumatic listhesis. 3. Unchanged multilevel cervical spondylosis, worst at C5-6, where there is at least moderate spinal canal stenosis. Electronically Signed   By: Orvan Falconer M.D.   On: 08/12/2023 19:20   CT Cervical Spine Wo Contrast  Result Date: 08/12/2023 CLINICAL DATA:  Neck trauma (Age >= 65y); Head trauma, minor (Age >= 65y). Altered mental status. EXAM: CT HEAD WITHOUT CONTRAST CT CERVICAL SPINE WITHOUT CONTRAST TECHNIQUE: Multidetector CT imaging of the head and cervical spine was performed following the standard  protocol without intravenous contrast. Multiplanar CT image reconstructions of the cervical spine were also generated. RADIATION DOSE REDUCTION: This exam was performed according to the departmental dose-optimization program which includes automated exposure control, adjustment of the mA and/or kV according to patient size and/or use of iterative reconstruction technique. COMPARISON:  CT head and cervical spine 11/17/2022. FINDINGS: CT HEAD FINDINGS Brain: No acute intracranial hemorrhage. Gray-white differentiation is preserved. No hydrocephalus or extra-axial collection. No mass effect or midline shift. Vascular: No hyperdense vessel or unexpected calcification. Skull: No calvarial fracture or suspicious bone lesion. Skull base is unremarkable. Sinuses/Orbits: No acute finding. Other: None. CT CERVICAL SPINE FINDINGS Alignment: Normal. Skull base and vertebrae: No acute fracture. Normal craniocervical junction. No suspicious bone  lesions. Multilevel degenerative disc height loss with degenerative endplate changes. Acquired fusion of the C6-7 level. Soft tissues and spinal canal: No prevertebral fluid or swelling. No visible canal hematoma. Disc levels: Unchanged multilevel cervical spondylosis, worst at C5-6, where there is at least moderate spinal canal stenosis. Upper chest: No acute findings. Other: Atherosclerotic calcifications of the carotid bulbs. IMPRESSION: 1. No acute intracranial abnormality. 2. No acute cervical spine fracture or traumatic listhesis. 3. Unchanged multilevel cervical spondylosis, worst at C5-6, where there is at least moderate spinal canal stenosis. Electronically Signed   By: Orvan Falconer M.D.   On: 08/12/2023 19:20   DG Chest Port 1 View  Result Date: 08/12/2023 CLINICAL DATA:  Shoulder pain EXAM: PORTABLE CHEST 1 VIEW COMPARISON:  11/17/2022 FINDINGS: The heart size and mediastinal contours are within normal limits. Aortic atherosclerosis. Both lungs are clear. The visualized  skeletal structures are unremarkable. IMPRESSION: No active disease. Electronically Signed   By: Jasmine Pang M.D.   On: 08/12/2023 17:56   DG Shoulder Right  Result Date: 08/12/2023 CLINICAL DATA:  Shoulder pain EXAM: RIGHT SHOULDER - 2+ VIEW COMPARISON:  None Available. FINDINGS: No fracture or malalignment. Chronic AC joint widening and mild deformity. Mild degenerative change at the glenohumeral interval. IMPRESSION: No acute osseous abnormality Electronically Signed   By: Jasmine Pang M.D.   On: 08/12/2023 17:55   DG Shoulder Left  Result Date: 08/12/2023 CLINICAL DATA:  Patient was found lying on the floor of his bathroom. Shoulder pain. EXAM: LEFT SHOULDER - 2+ VIEW COMPARISON:  None Available. FINDINGS: No acute fracture or dislocation. Superior subluxation of the humeral head in relation to the glenoid likely due to rotator cuff pathology. Degenerative arthritis AC and glenohumeral joints. IMPRESSION: 1. No acute fracture or dislocation. 2. Findings compatible with rotator cuff pathology. Electronically Signed   By: Minerva Fester M.D.   On: 08/12/2023 17:53    EKG:  A flutter with 4:1 conduction, LAD, LVH, RBBB, Computer read of acute MI, no ST / T changes to suggest this.   ED Course:  Imaging obtained per above.  Treated with 1 L of fluids   Assessment/Plan:  89 y.o. male with hx A-fib on anticoagulation, diabetes, hypertension, hyperlipidemia, hypothyroidism, HFpEF, prostate cancer, prior DVT, who was brought in by EMS after wellness check. Found down on EMS arrival.   Ground-level fall, unknown downtime Unknown if associated syncope.  History is limited, patient appears to have possible underlying neurocognitive issue/dementia.  CT head and C-spine without acute abnormalities; does have moderate central canal stenosis at C5-6 which is chronic CT chest abdomen pelvis also without acute process.  Associated rhabdomyolysis.  -Management of rhabdo per below -Telemetry  monitoring -PT evaluation -Orthostatics in the morning  Question acute encephalopathy, Possibly metabolic due to dehydration Suspect possible underlying neurocognitive disorder/dementia Reappears intact to distal events, but has impaired short-term memory.  Attention is intact.  Oriented to self, [facility] only.  -IV fluid hydration -Thiamine daily -Check B12 -Outpatient neurology evaluation for underlying dementia   Rhabdomyolysis CK 5591, associated with ground-level fall with unknown downtime.  -s/p1 L IV fluid in the ED.   - Continue LR 100 cc an hour x 10 hours then reevaluate need for IV fluids.  Repeat CK in the morning  Lactic acidosis-resolved Suspect hypovolemic -IV fluids per above  Acute myocardial injury EKG with a flutter, no acute ischemic change.  High-sensitivity troponin 92, downtrending to 84.  Suspect demand event with hypovolemia, possible missed hypotension at home -  Unclear if he is still taking DOAC or aspirin; verified with collateral and can resume daily aspirin if not taking.   Hyperbilirubinemia  AST most likely coming from his rhabdomyolysis.  However he has an elevation in his total bili to 3.7.  Main complaints involve right lower chest, right upper quadrant pain suspected to be MSK. CT A/P with contrast with liver cysts, no f/u recommended. Post cholecystectomy and no biliary dilation.  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 Baseline Cr approx 1.1; minimal elevation here to 1.4. - -IVF, Trend BMP  Erythrocytosis, presumed 2/2 hemoconcentration  - IVF per above   Chronic medical problems: A- flutter: unknown if on anticoagulation, last fill for Eliquis was in 1/'24.  Rate controlled without nodal agents HFpEF: Currently hypovolemic, not on diuretics at home. Hypertension: Hold home lisinopril  Hypothyroidism: Continues home levothyroxine.  Check TSH HLD: Does not appear still  taking statin History of diabetes: Check A1c, blood glucose in normal range.  Can add SSI if runs high History of prostate cancer, radiation seeds Prior DVT: More recently on anticoagulation for A-fib.  See above, do not believe he is still taking  Body mass index is 25.56 kg/m.    DVT prophylaxis:  Lovenox for now, clarify if still on DOAC  Code Status:  Full Code Diet:  Diet Orders (From admission, onward)     Start     Ordered   08/12/23 1426  Diet NPO time specified  Diet effective now        08/12/23 1426           Family Communication:  Attempted to call his wife but no answer Consults: None Admission status:   Inpatient, Telemetry bed  Severity of Illness: The appropriate patient status for this patient is INPATIENT. Inpatient status is judged to be reasonable and necessary in order to provide the required intensity of service to ensure the patient's safety. The patient's presenting symptoms, physical exam findings, and initial radiographic and laboratory data in the context of their chronic comorbidities is felt to place them at high risk for further clinical deterioration. Furthermore, it is not anticipated that the patient will be medically stable for discharge from the hospital within 2 midnights of admission.   * I certify that at the point of admission it is my clinical judgment that the patient will require inpatient hospital care spanning beyond 2 midnights from the point of admission due to high intensity of service, high risk for further deterioration and high frequency of surveillance required.*   Dolly Rias, MD [facility]  How to contact the [facility] Attending or Consulting provider 7A - 7P or covering provider during after hours 7P -7A, for this patient.  Check the care team in [facility] and look for a) attending/consulting [facility] provider listed and b) the [facility] team listed Log into [email or web address] and use [place]'s universal password to access. If you do not  have the password, please contact the hospital operator. Locate the [facility] provider you are looking for under [facility] and page to a number that you can be directly reached. If you still have difficulty reaching the provider, please page the [facility] (Director on Call) for the Hospitalists listed on amion for assistance.  08/12/2023, 8:11 PM

## 2023-08-12 NOTE — ED Notes (Signed)
ED TO INPATIENT HANDOFF REPORT  ED Nurse Name and Phone #: Jeannett Senior [phone]  S Name/Age/Gender [name] [name] 89 y.o. male Room/Bed: 041C/041C  Code Status   Code Status: Full Code  Home/SNF/Other Home Patient oriented to: self and place Is this baseline?  Unsure  Triage Complete: Triage complete  Chief Complaint Ground-level fall [W18.30XA]  Triage Note PT arrives via EMS from home. A welfare check was done on patient and his wife. PT was found laying in the floor of his bathroom. Unknown downtime. Pt reports pain to stomach, back, shoulders, and hips. Pt was last seen on Wednesday. Pt arrives AxO to name, dob, and location. He is confused about the date. Unsure if this is the patient's baseline. EMS did place patient in a C-Collar.    Allergies Allergies  Allergen Reactions   Sildenafil Other (See Comments)    Other reaction(s): Blurring of visual image    Level of Care/Admitting Diagnosis ED Disposition     ED Disposition  Admit   Condition  --   Comment  Hospital Area: [facility] [[id number]]  Level of Care: Telemetry Medical [104]  May admit patient to [facility] or [facility] if equivalent level of care is available:: Yes  Covid Evaluation: Asymptomatic - no recent exposure (last 10 days) testing not required  Diagnosis: Ground-level fall [[id number]]  Admitting Physician: Dolly Rias [[id number]]  Attending Physician: Dolly Rias [[id number]]  Certification:: I certify this patient will need inpatient services for at least 2 midnights          B Medical/Surgery History Past Medical History:  Diagnosis Date   B12 deficiency    Chronic heart failure with preserved ejection fraction (HFpEF) (HCC)    Falls    Hypertension    Hypothyroidism    Iron deficiency anemia    Prostate cancer (HCC)    Trifascicular block    Past Surgical History:  Procedure Laterality Date   HIP SURGERY       A IV Location/Drains/Wounds Patient  Lines/Drains/Airways Status     Active Line/Drains/Airways     Name Placement date Placement time Site Days   Peripheral IV 08/12/23 20 G Left Antecubital 08/12/23  1419  Antecubital  less than 1   Peripheral IV 08/12/23 20 G 1" Anterior;Right Forearm 08/12/23  1439  Forearm  less than 1            Intake/Output Last 24 hours  Intake/Output Summary (Last 24 hours) at 08/12/2023 2110 Last data filed at 08/12/2023 1738 Gross per 24 hour  Intake 1000 ml  Output --  Net 1000 ml    Labs/Imaging Results for orders placed or performed during the hospital encounter of 08/12/23 (from the past 48 hour(s))  Comprehensive metabolic panel     Status: Abnormal   Collection Time: 08/12/23  2:26 PM  Result Value Ref Range   Sodium 136 135 - 145 mmol/L   Potassium 4.7 3.5 - 5.1 mmol/L   Chloride 98 98 - 111 mmol/L   CO2 19 (L) 22 - 32 mmol/L   Glucose, Bld 95 70 - 99 mg/dL    Comment: Glucose reference range applies only to samples taken after fasting for at least 8 hours.   BUN 26 (H) 8 - 23 mg/dL   Creatinine, Ser 2.95 (H) 0.61 - 1.24 mg/dL   Calcium 9.5 8.9 - 28.4 mg/dL   Total Protein 7.4 6.5 - 8.1 g/dL   Albumin 3.9 3.5 - 5.0 g/dL  AST 185 (H) 15 - 41 U/L   ALT 63 (H) 0 - 44 U/L   Alkaline Phosphatase 64 38 - 126 U/L   Total Bilirubin 3.7 (H) 0.3 - 1.2 mg/dL   GFR, Estimated 48 (L) >60 mL/min    Comment: (NOTE) Calculated using the CKD-EPI Creatinine Equation (2021)    Anion gap 19 (H) 5 - 15    Comment: Performed at [facility], [address], [place], [place] [address]  CBC     Status: Abnormal   Collection Time: 08/12/23  2:26 PM  Result Value Ref Range   WBC 6.1 4.0 - 10.5 K/uL   RBC 6.07 (H) 4.22 - 5.81 MIL/uL   Hemoglobin 18.7 (H) 13.0 - 17.0 g/dL   HCT 60.4 (H) 54.0 - 98.1 %   MCV 88.0 80.0 - 100.0 fL   MCH 30.8 26.0 - 34.0 pg   MCHC 35.0 30.0 - 36.0 g/dL   RDW 19.1 47.8 - 29.5 %   Platelets 166 150 - 400 K/uL   nRBC 0.0 0.0 - 0.2 %    Comment:  Performed at [facility], [address], [place], [place] [address]  CK     Status: Abnormal   Collection Time: 08/12/23  2:26 PM  Result Value Ref Range   Total CK 5,591 (H) 49 - 397 U/L    Comment: RESULT CONFIRMED BY MANUAL DILUTION Performed at [facility], [address] [place], [place] [address]   CBG monitoring, ED     Status: Abnormal   Collection Time: 08/12/23  2:37 PM  Result Value Ref Range   Glucose-Capillary 100 (H) 70 - 99 mg/dL    Comment: Glucose reference range applies only to samples taken after fasting for at least 8 hours.  I-Stat CG4 Lactic Acid     Status: Abnormal   Collection Time: 08/12/23  2:50 PM  Result Value Ref Range   Lactic Acid, Venous 2.5 (HH) 0.5 - 1.9 mmol/L   Comment NOTIFIED PHYSICIAN   Ethanol     Status: None   Collection Time: 08/12/23  3:49 PM  Result Value Ref Range   Alcohol, Ethyl (B) <10 <10 mg/dL    Comment: (NOTE) Lowest detectable limit for serum alcohol is 10 mg/dL.  For medical purposes only. Performed at [facility], [address], [place], [place] [address]   Troponin I (High Sensitivity)     Status: Abnormal   Collection Time: 08/12/23  3:49 PM  Result Value Ref Range   Troponin I (High Sensitivity) 92 (H) <18 ng/L    Comment: (NOTE) Elevated high sensitivity troponin I (hsTnI) values and significant  changes across serial measurements may suggest ACS but many other  chronic and acute conditions are known to elevate hsTnI results.  Refer to the "Links" section for chest pain algorithms and additional  guidance. Performed at [facility], [address], [place], [place] [address]   Ammonia     Status: None   Collection Time: 08/12/23  3:49 PM  Result Value Ref Range   Ammonia 15 9 - 35 umol/L    Comment: Performed at [facility], [address], [place], [place] [address]  Urinalysis, w/ Reflex to Culture (Infection Suspected) -Urine, Clean Catch     Status: Abnormal    Collection Time: 08/12/23  3:49 PM  Result Value Ref Range   Specimen Source URINE, CLEAN CATCH    Color, Urine YELLOW  YELLOW   APPearance CLEAR CLEAR   Specific Gravity, Urine 1.018 1.005 - 1.030   pH 5.0 5.0 - 8.0   Glucose, UA NEGATIVE NEGATIVE mg/dL   Hgb urine dipstick NEGATIVE NEGATIVE   Bilirubin Urine NEGATIVE NEGATIVE   Ketones, ur 20 (A) NEGATIVE mg/dL   Protein, ur NEGATIVE NEGATIVE mg/dL   Nitrite NEGATIVE NEGATIVE   Leukocytes,Ua NEGATIVE NEGATIVE   RBC / HPF 0-5 0 - 5 RBC/hpf   WBC, UA 0-5 0 - 5 WBC/hpf    Comment:        Reflex urine culture not performed if WBC <=10, OR if Squamous epithelial cells >5. If Squamous epithelial cells >5 suggest recollection.    Bacteria, UA RARE (A) NONE SEEN   Squamous Epithelial / HPF 0-5 0 - 5 /HPF   Mucus PRESENT    Hyaline Casts, UA PRESENT     Comment: Performed at [facility], [address], [place], [place] [address]  TSH     Status: None   Collection Time: 08/12/23  5:39 PM  Result Value Ref Range   TSH 0.693 0.350 - 4.500 uIU/mL    Comment: Performed by a 3rd Generation assay with a functional sensitivity of <=0.01 uIU/mL. Performed at [facility], [address] [place], [place] [address]   Troponin I (High Sensitivity)     Status: Abnormal   Collection Time: 08/12/23  5:39 PM  Result Value Ref Range   Troponin I (High Sensitivity) 84 (H) <18 ng/L    Comment: (NOTE) Elevated high sensitivity troponin I (hsTnI) values and significant  changes across serial measurements may suggest ACS but many other  chronic and acute conditions are known to elevate hsTnI results.  Refer to the "Links" section for chest pain algorithms and additional  guidance. Performed at [facility], [address] [place], [place] [address]   I-Stat CG4 Lactic Acid     Status: None   Collection Time: 08/12/23  5:45 PM  Result Value Ref Range   Lactic Acid, Venous 1.8 0.5 - 1.9 mmol/L   CT CHEST ABDOMEN PELVIS W  CONTRAST  Result Date: 08/12/2023 CLINICAL DATA:  Found down. Polytrauma, blunt. Altered mental status. Abdominal pain. EXAM: CT CHEST, ABDOMEN, AND PELVIS WITH CONTRAST TECHNIQUE: Multidetector CT imaging of the chest, abdomen and pelvis was performed following the standard protocol during bolus administration of intravenous contrast. RADIATION DOSE REDUCTION: This exam was performed according to the departmental dose-optimization program which includes automated exposure control, adjustment of the mA and/or kV according to patient size and/or use of iterative reconstruction technique. CONTRAST:  75mL OMNIPAQUE IOHEXOL 350 MG/ML SOLN COMPARISON:  None Available. FINDINGS: CT CHEST FINDINGS Cardiovascular: Heart is normal size. Aorta is normal caliber. Scattered coronary artery and aortic calcifications. Mediastinum/Nodes: No mediastinal, hilar, or axillary adenopathy. Trachea and esophagus are unremarkable. Thyroid unremarkable. Lungs/Pleura: Lungs are clear. No focal airspace opacities or suspicious nodules. No effusions. Musculoskeletal: Chest wall soft tissues are unremarkable. No acute bony abnormality. CT ABDOMEN PELVIS FINDINGS Hepatobiliary: Scattered hypodensities in the liver most compatible with cysts, the largest 2.5 cm in the left hepatic dome. No additional follow-up imaging recommended. Prior cholecystectomy. No biliary ductal dilatation. No perihepatic hematoma. Pancreas: No focal abnormality or ductal dilatation. Spleen: No splenic injury or perisplenic hematoma. Adrenals/Urinary Tract: No adrenal hemorrhage or renal injury identified. Bladder is unremarkable. Stomach/Bowel: Normal appendix. Stomach, large and small bowel grossly unremarkable. Vascular/Lymphatic: Aortic atherosclerosis. No evidence of aneurysm or adenopathy. Reproductive:  Radiation seeds in the region of the prostate. Other: No free fluid or free air. Musculoskeletal: Prior right hip replacement. No acute bony abnormality.  IMPRESSION: No acute findings or evidence of significant traumatic injury in the chest, abdomen or pelvis. Coronary artery disease, aortic atherosclerosis. Electronically Signed   By: Charlett Nose M.D.   On: 08/12/2023 19:28   CT Head Wo Contrast  Result Date: 08/12/2023 CLINICAL DATA:  Neck trauma (Age >= 65y); Head trauma, minor (Age >= 65y). Altered mental status. EXAM: CT HEAD WITHOUT CONTRAST CT CERVICAL SPINE WITHOUT CONTRAST TECHNIQUE: Multidetector CT imaging of the head and cervical spine was performed following the standard protocol without intravenous contrast. Multiplanar CT image reconstructions of the cervical spine were also generated. RADIATION DOSE REDUCTION: This exam was performed according to the departmental dose-optimization program which includes automated exposure control, adjustment of the mA and/or kV according to patient size and/or use of iterative reconstruction technique. COMPARISON:  CT head and cervical spine 11/17/2022. FINDINGS: CT HEAD FINDINGS Brain: No acute intracranial hemorrhage. Gray-white differentiation is preserved. No hydrocephalus or extra-axial collection. No mass effect or midline shift. Vascular: No hyperdense vessel or unexpected calcification. Skull: No calvarial fracture or suspicious bone lesion. Skull base is unremarkable. Sinuses/Orbits: No acute finding. Other: None. CT CERVICAL SPINE FINDINGS Alignment: Normal. Skull base and vertebrae: No acute fracture. Normal craniocervical junction. No suspicious bone lesions. Multilevel degenerative disc height loss with degenerative endplate changes. Acquired fusion of the C6-7 level. Soft tissues and spinal canal: No prevertebral fluid or swelling. No visible canal hematoma. Disc levels: Unchanged multilevel cervical spondylosis, worst at C5-6, where there is at least moderate spinal canal stenosis. Upper chest: No acute findings. Other: Atherosclerotic calcifications of the carotid bulbs. IMPRESSION: 1. No acute  intracranial abnormality. 2. No acute cervical spine fracture or traumatic listhesis. 3. Unchanged multilevel cervical spondylosis, worst at C5-6, where there is at least moderate spinal canal stenosis. Electronically Signed   By: Orvan Falconer M.D.   On: 08/12/2023 19:20   CT Cervical Spine Wo Contrast  Result Date: 08/12/2023 CLINICAL DATA:  Neck trauma (Age >= 65y); Head trauma, minor (Age >= 65y). Altered mental status. EXAM: CT HEAD WITHOUT CONTRAST CT CERVICAL SPINE WITHOUT CONTRAST TECHNIQUE: Multidetector CT imaging of the head and cervical spine was performed following the standard protocol without intravenous contrast. Multiplanar CT image reconstructions of the cervical spine were also generated. RADIATION DOSE REDUCTION: This exam was performed according to the departmental dose-optimization program which includes automated exposure control, adjustment of the mA and/or kV according to patient size and/or use of iterative reconstruction technique. COMPARISON:  CT head and cervical spine 11/17/2022. FINDINGS: CT HEAD FINDINGS Brain: No acute intracranial hemorrhage. Gray-white differentiation is preserved. No hydrocephalus or extra-axial collection. No mass effect or midline shift. Vascular: No hyperdense vessel or unexpected calcification. Skull: No calvarial fracture or suspicious bone lesion. Skull base is unremarkable. Sinuses/Orbits: No acute finding. Other: None. CT CERVICAL SPINE FINDINGS Alignment: Normal. Skull base and vertebrae: No acute fracture. Normal craniocervical junction. No suspicious bone lesions. Multilevel degenerative disc height loss with degenerative endplate changes. Acquired fusion of the C6-7 level. Soft tissues and spinal canal: No prevertebral fluid or swelling. No visible canal hematoma. Disc levels: Unchanged multilevel cervical spondylosis, worst at C5-6, where there is at least moderate spinal canal stenosis. Upper chest: No acute findings. Other: Atherosclerotic  calcifications of the carotid bulbs. IMPRESSION: 1. No acute intracranial abnormality. 2. No acute cervical spine fracture or traumatic listhesis. 3. Unchanged  multilevel cervical spondylosis, worst at C5-6, where there is at least moderate spinal canal stenosis. Electronically Signed   By: Orvan Falconer M.D.   On: 08/12/2023 19:20   DG Chest Port 1 View  Result Date: 08/12/2023 CLINICAL DATA:  Shoulder pain EXAM: PORTABLE CHEST 1 VIEW COMPARISON:  11/17/2022 FINDINGS: The heart size and mediastinal contours are within normal limits. Aortic atherosclerosis. Both lungs are clear. The visualized skeletal structures are unremarkable. IMPRESSION: No active disease. Electronically Signed   By: Jasmine Pang M.D.   On: 08/12/2023 17:56   DG Shoulder Right  Result Date: 08/12/2023 CLINICAL DATA:  Shoulder pain EXAM: RIGHT SHOULDER - 2+ VIEW COMPARISON:  None Available. FINDINGS: No fracture or malalignment. Chronic AC joint widening and mild deformity. Mild degenerative change at the glenohumeral interval. IMPRESSION: No acute osseous abnormality Electronically Signed   By: Jasmine Pang M.D.   On: 08/12/2023 17:55   DG Shoulder Left  Result Date: 08/12/2023 CLINICAL DATA:  Patient was found lying on the floor of his bathroom. Shoulder pain. EXAM: LEFT SHOULDER - 2+ VIEW COMPARISON:  None Available. FINDINGS: No acute fracture or dislocation. Superior subluxation of the humeral head in relation to the glenoid likely due to rotator cuff pathology. Degenerative arthritis AC and glenohumeral joints. IMPRESSION: 1. No acute fracture or dislocation. 2. Findings compatible with rotator cuff pathology. Electronically Signed   By: Minerva Fester M.D.   On: 08/12/2023 17:53    Pending Labs Unresulted Labs (From admission, onward)     Start     Ordered   08/13/23 0500  Basic metabolic panel  Tomorrow morning,   R        08/12/23 2046   08/13/23 0500  CBC  Tomorrow morning,   R        08/12/23 2046    08/13/23 0500  Magnesium  Tomorrow morning,   R        08/12/23 2046   08/13/23 0500  Phosphorus  Tomorrow morning,   R        08/12/23 2046   08/13/23 0500  Hepatic function panel  Tomorrow morning,   R        08/12/23 2046   08/13/23 0500  CK  Tomorrow morning,   R        08/12/23 2046   08/13/23 0500  TSH  Tomorrow morning,   R        08/12/23 2046   08/13/23 0500  Vitamin B12  Tomorrow morning,   R        08/12/23 2057   08/13/23 0500  Hemoglobin A1c  Tomorrow morning,   R        08/12/23 2104   08/12/23 2111  Bilirubin, direct  Add-on,   AD        08/12/23 2110            Vitals/Pain Today's Vitals   08/12/23 1937 08/12/23 1937 08/12/23 1938 08/12/23 2104  BP: (!) 152/80     Pulse: 63   64  Resp: 15   18  Temp:   97.7 F (36.5 C)   TempSrc:   Oral   SpO2: 100%   100%  Weight:      Height:      PainSc:  6       Isolation Precautions No active isolations  Medications Medications  sodium chloride flush (NS) 0.9 % injection 3 mL (has no administration in time range)  lactated ringers infusion (has no  administration in time range)  folic acid (FOLVITE) tablet 1 mg (has no administration in time range)  multivitamin with minerals tablet 1 tablet (has no administration in time range)  thiamine (VITAMIN B1) tablet 100 mg (has no administration in time range)  levothyroxine (SYNTHROID) tablet 150 mcg (has no administration in time range)  tamsulosin (FLOMAX) capsule 0.4 mg (has no administration in time range)  sodium chloride 0.9 % bolus 1,000 mL (0 mLs Intravenous Stopped 08/12/23 1738)  iohexol (OMNIPAQUE) 350 MG/ML injection 75 mL (75 mLs Intravenous Contrast Given 08/12/23 1731)    Mobility walks     Focused Assessments See provider note.   R Recommendations: See Admitting Provider Note  Report given to:   Additional Notes:

## 2023-08-13 DIAGNOSIS — E039 Hypothyroidism, unspecified: Secondary | ICD-10-CM | POA: Diagnosis not present

## 2023-08-13 DIAGNOSIS — W1830XA Fall on same level, unspecified, initial encounter: Secondary | ICD-10-CM | POA: Diagnosis not present

## 2023-08-13 DIAGNOSIS — E785 Hyperlipidemia, unspecified: Secondary | ICD-10-CM

## 2023-08-13 LAB — CK: Total CK: 3423 U/L — ABNORMAL HIGH (ref 49–397)

## 2023-08-13 LAB — CBC
HCT: 49.7 % (ref 39.0–52.0)
Hemoglobin: 17.6 g/dL — ABNORMAL HIGH (ref 13.0–17.0)
MCH: 31.2 pg (ref 26.0–34.0)
MCHC: 35.4 g/dL (ref 30.0–36.0)
MCV: 88.1 fL (ref 80.0–100.0)
Platelets: 146 10*3/uL — ABNORMAL LOW (ref 150–400)
RBC: 5.64 MIL/uL (ref 4.22–5.81)
RDW: 13.3 % (ref 11.5–15.5)
WBC: 5.8 10*3/uL (ref 4.0–10.5)
nRBC: 0 % (ref 0.0–0.2)

## 2023-08-13 LAB — TSH: TSH: 1.065 u[IU]/mL (ref 0.350–4.500)

## 2023-08-13 LAB — BASIC METABOLIC PANEL
Anion gap: 13 (ref 5–15)
BUN: 26 mg/dL — ABNORMAL HIGH (ref 8–23)
CO2: 21 mmol/L — ABNORMAL LOW (ref 22–32)
Calcium: 8.9 mg/dL (ref 8.9–10.3)
Chloride: 104 mmol/L (ref 98–111)
Creatinine, Ser: 1.22 mg/dL (ref 0.61–1.24)
GFR, Estimated: 57 mL/min — ABNORMAL LOW (ref >60)
Glucose, Bld: 105 mg/dL — ABNORMAL HIGH (ref 70–99)
Potassium: 4.2 mmol/L (ref 3.5–5.1)
Sodium: 138 mmol/L (ref 135–145)

## 2023-08-13 LAB — HEPATIC FUNCTION PANEL
ALT: 54 U/L — ABNORMAL HIGH (ref 0–44)
AST: 134 U/L — ABNORMAL HIGH (ref 15–41)
Albumin: 3.3 g/dL — ABNORMAL LOW (ref 3.5–5.0)
Alkaline Phosphatase: 55 U/L (ref 38–126)
Bilirubin, Direct: 0.4 mg/dL — ABNORMAL HIGH (ref 0.0–0.2)
Indirect Bilirubin: 2.2 mg/dL — ABNORMAL HIGH (ref 0.3–0.9)
Total Bilirubin: 2.6 mg/dL — ABNORMAL HIGH (ref 0.3–1.2)
Total Protein: 6.5 g/dL (ref 6.5–8.1)

## 2023-08-13 LAB — PHOSPHORUS: Phosphorus: 3.2 mg/dL (ref 2.5–4.6)

## 2023-08-13 LAB — HEMOGLOBIN A1C
Hgb A1c MFr Bld: 5.5 % (ref 4.8–5.6)
Mean Plasma Glucose: 111.15 mg/dL

## 2023-08-13 LAB — VITAMIN B12: Vitamin B-12: 844 pg/mL (ref 180–914)

## 2023-08-13 LAB — BILIRUBIN, DIRECT: Bilirubin, Direct: 0.4 mg/dL — ABNORMAL HIGH (ref 0.0–0.2)

## 2023-08-13 LAB — MAGNESIUM: Magnesium: 2.2 mg/dL (ref 1.7–2.4)

## 2023-08-13 MED ORDER — SENNOSIDES-DOCUSATE SODIUM 8.6-50 MG PO TABS: 1.0000 | ORAL_TABLET | Freq: Every evening | ORAL | Status: DC | PRN

## 2023-08-13 MED ORDER — IPRATROPIUM-ALBUTEROL 0.5-2.5 (3) MG/3ML IN SOLN: 3.0000 mL | RESPIRATORY_TRACT | Status: DC | PRN

## 2023-08-13 MED ORDER — METOPROLOL TARTRATE 5 MG/5ML IV SOLN: 5.0000 mg | INTRAVENOUS | Status: DC | PRN

## 2023-08-13 MED ORDER — ONDANSETRON HCL 4 MG/2ML IJ SOLN: 4.0000 mg | Freq: Four times a day (QID) | INTRAMUSCULAR | Status: DC | PRN

## 2023-08-13 MED ORDER — SODIUM CHLORIDE 0.9 % IV BOLUS
1000.0000 mL | Freq: Once | INTRAVENOUS | Status: AC
  Administered 2023-08-13: 1000 mL via INTRAVENOUS

## 2023-08-13 MED ORDER — HYDRALAZINE HCL 20 MG/ML IJ SOLN: 10.0000 mg | INTRAMUSCULAR | Status: DC | PRN

## 2023-08-13 MED ORDER — ACETAMINOPHEN 325 MG PO TABS
650.0000 mg | ORAL_TABLET | Freq: Four times a day (QID) | ORAL | Status: DC | PRN
  Administered 2023-08-13 – 2023-08-16 (×3): 650 mg via ORAL
  Filled 2023-08-13 (×4): qty 2

## 2023-08-13 MED ORDER — TRAZODONE HCL 50 MG PO TABS: 50.0000 mg | ORAL_TABLET | Freq: Every evening | ORAL | Status: DC | PRN

## 2023-08-13 MED ORDER — GUAIFENESIN 100 MG/5ML PO LIQD: 5.0000 mL | ORAL | Status: DC | PRN

## 2023-08-13 NOTE — Evaluation (Signed)
Physical Therapy Evaluation Patient Details Name: [name] [name] MRN: [id number] DOB: 1932-03-22 Today's Date: 08/13/2023  History of Present Illness  The pt is an 89 yo male presenting 10/11 after he was found down for an unknown period of time during a wellness check. Pt found to have no acute bony injury, but does have acute encephalopathy on possible underlying neurocognitive disorder/dementia, dehydration, and rhabdomyolysis. PMH includes: frequent falls, HTN, prostate cancer, and hip surgery.   Clinical Impression  Pt in bed upon arrival of PT, agreeable to evaluation at this time. Prior to admission the pt reports he was independent with use of RW, driving, running errands, and managing laundry and meals at home for himself and his wife. The pt reports both he and his wife fall ~2x/month, he is able to get up without assistance but that he typically assists her when she falls. The pt reports no family nearby to assist and states that his wife is possibly at a hospital in [place]. The pt needed minA to CGA for transfers and minA with ~100 ft hallway ambulation at this time. He ambulates with narrow BOS and had multiple near-scissoring steps with increased sway when walking without any challenge. I feel this patient is at high risk for continued falls and needs supervision both for ADLs and medical management as well as for mobility. If family is unable to provide, will benefit from post-acute rehab <3 hours/day to facilitate improved stability, endurance, and independence with transfers.    If plan is discharge home, recommend the following: A little help with walking and/or transfers;A little help with bathing/dressing/bathroom;Assistance with cooking/housework;Direct supervision/assist for medications management;Direct supervision/assist for financial management;Assist for transportation;Help with stairs or ramp for entrance;Supervision due to cognitive status   Can travel by private vehicle    Yes    Equipment Recommendations None recommended by PT  Recommendations for Other Services       Functional Status Assessment Patient has had a recent decline in their functional status and demonstrates the ability to make significant improvements in function in a reasonable and predictable amount of time.     Precautions / Restrictions Precautions Precautions: Fall Precaution Comments: pt reports falling 2x/month, admitted after being found down Restrictions Weight Bearing Restrictions: No      Mobility  Bed Mobility Overal bed mobility: Needs Assistance Bed Mobility: Supine to Sit     Supine to sit: Min assist, HOB elevated, Used rails     General bed mobility comments: use of rails and pt pulling on PT to elevate trunk    Transfers Overall transfer level: Needs assistance Equipment used: Rolling walker (2 wheels) Transfers: Sit to/from Stand, Bed to chair/wheelchair/BSC Sit to Stand: Min assist, Contact guard assist   Step pivot transfers: Contact guard assist       General transfer comment: minA with cues for hand placement, slow to rise, slight posterior lean. pt with rib pain with transition. CGA with cues for line management for pivot to chair    Ambulation/Gait Ambulation/Gait assistance: Contact guard assist, Min assist Gait Distance (Feet): 100 Feet Assistive device: Rolling walker (2 wheels) Gait Pattern/deviations: Step-through pattern, Shuffle, Trunk flexed, Narrow base of support Gait velocity: decreased Gait velocity interpretation: <1.31 ft/sec, indicative of household ambulator   General Gait Details: pt with multiple near-scissoring steps, narrow BOS, no overt LOB but increased sway and dependent on BUE support.     Balance Overall balance assessment: Needs assistance Sitting-balance support: No upper extremity supported, Feet supported Sitting balance-Leahy Scale:  Fair     Standing balance support: Bilateral upper extremity supported,  During functional activity Standing balance-Leahy Scale: Poor                               Pertinent Vitals/Pain Pain Assessment Pain Assessment: Faces Faces Pain Scale: Hurts even more Pain Location: R ribs/side of chest Pain Descriptors / Indicators: Discomfort, Grimacing, Guarding Pain Intervention(s): Limited activity within patient's tolerance, Monitored during session, Repositioned    Home Living Family/patient expects to be discharged to:: Private residence Living Arrangements: Spouse/significant other Available Help at Discharge: Family;Other (Comment) (pt lives with wife, reports she is unable to help physically and falls as well) Type of Home: Apartment Home Access: Level entry       Home Layout: One level Home Equipment: Agricultural consultant (2 wheels);Cane - single point Additional Comments: pt reports from home wiht wife, falls often. no family in state other than wife's neice. pt also with some dicrepancies in answers vs chart, no family present to confirm    Prior Function Prior Level of Function : Independent/Modified Independent;Driving;History of Falls (last six months)             Mobility Comments: pt reports use of RW, falls ~2x/month but is able to get up without assist. drives for the couple and does errands ADLs Comments: pt reports independent, wife manages medications, pt does driving.     Extremity/Trunk Assessment   Upper Extremity Assessment Upper Extremity Assessment: Defer to OT evaluation    Lower Extremity Assessment Lower Extremity Assessment: Generalized weakness (no focal deficits, reports sensation intact)    Cervical / Trunk Assessment Cervical / Trunk Assessment: Kyphotic  Communication   Communication Communication: No apparent difficulties Cueing Techniques: Verbal cues  Cognition Arousal: Alert Behavior During Therapy: WFL for tasks assessed/performed Overall Cognitive Status: No family/caregiver present to  determine baseline cognitive functioning                                 General Comments: pt with no offical dx of dementia, per chart "possible underlying neurocognitive disorder/dementia" pt was oriented to month, not day or situation, but knew he was at [place]. however, when attempting to call family in [place] and [place], but unsure if these were local calls.        General Comments General comments (skin integrity, edema, etc.): VSS on RA    Exercises     Assessment/Plan    PT Assessment Patient needs continued PT services  PT Problem List Decreased strength;Decreased activity tolerance;Decreased balance;Decreased mobility;Decreased cognition;Decreased safety awareness       PT Treatment Interventions DME instruction;Gait training;Stair training;Functional mobility training;Therapeutic activities;Therapeutic exercise;Balance training;Cognitive remediation;Patient/family education    PT Goals (Current goals can be found in the Care Plan section)  Acute Rehab PT Goals Patient Stated Goal: improve mobility, reduce falls PT Goal Formulation: With patient Time For Goal Achievement: 08/27/23 Potential to Achieve Goals: Good    Frequency Min 1X/week        AM-PAC PT "6 Clicks" Mobility  Outcome Measure Help needed turning from your back to your side while in a flat bed without using bedrails?: A Little Help needed moving from lying on your back to sitting on the side of a flat bed without using bedrails?: A Little Help needed moving to and from a bed to a chair (including a wheelchair)?:  A Little Help needed standing up from a chair using your arms (e.g., wheelchair or bedside chair)?: A Little Help needed to walk in hospital room?: A Little Help needed climbing 3-5 steps with a railing? : A Little 6 Click Score: 18    End of Session Equipment Utilized During Treatment: Gait belt Activity Tolerance: Patient tolerated treatment well Patient left:  in chair;with call bell/phone within reach;with chair alarm set Nurse Communication: Mobility status PT Visit Diagnosis: Unsteadiness on feet (R26.81);Repeated falls (R29.6);Muscle weakness (generalized) (M62.81);History of falling (Z91.81)    Time: 1447-1510 PT Time Calculation (min) (ACUTE ONLY): 23 min   Charges:   PT Evaluation $PT Eval Low Complexity: 1 Low PT Treatments $Therapeutic Activity: 8-22 mins PT General Charges $$ ACUTE PT VISIT: 1 Visit         Vickki Muff, PT, DPT   Acute Rehabilitation Department Office [phone] Secure Chat Communication Preferred  Ronnie Derby 08/13/2023, 3:29 PM

## 2023-08-13 NOTE — Progress Notes (Signed)
PROGRESS NOTE    [name] [name]  ZOX:[id number] DOB: October 31, 1932 DOA: 08/12/2023 PCP: Knox Royalty, MD   Brief Narrative:  89 year old with history of A-fib on Eliquis, DM 2, HTN, HLD, hypothyroidism, CHF with preserved EF, prostate cancer, prior DVT comes to the ED after a wellness check and he was found on the ground at home.  Patient does not recall any events leading up to this.  Thinks he may have fallen 2 days prior to coming to the hospital.  Upon admission noted to have rhabdomyolysis requiring IV fluids.   Assessment & Plan:  Principal Problem:   Ground-level fall    Ground-level fall, unknown downtime Unknown downtime.  CT head and cervical spine are negative.  UA negative.  TSH, ammonia, B12 are normal. PT/OT   Mild cognitive impairment Possibly underlying dementia which has been progressing.  Trauma workup negative.  No focal neurodeficits.  B12 was normal.   Rhabdomyolysis CK 5591, improving continue IV fluids   Lactic acidosis -resolved   Elevated troponin EKG with a flutter, no acute ischemic change.  Initial troponin 92, remains flat and trending down.   Hyperbilirubinemia  Transaminitis Elevated AST compared to ALT and total bilirubin.  Wonder if he has history of alcohol abuse?Marland Kitchen  Will trend LFTs, acute hepatitis panel. 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  Admission creatinine 1.4 now close to baseline of 1.2   Erythrocytosis, presumed 2/2 hemoconcentration  Resolved   A- flutter:  Not on any AV nodal blocker.  Continue Eliquis  HFpEF:  Currently hypovolemic, not on diuretics at home.  Currently appears dehydrated therefore fluids  Hypertension:  Hold home lisinopril.  IV as needed  Hypothyroidism:  Continues home levothyroxine.  Check TSH  HLD:  Does not appear still taking statin  History of diabetes:  A1c 5.5.  Sliding scale and Accu-Cheks  History of prostate cancer,  radiation seeds  Prior DVT: On Eliquis  PT/OT We will defnitely need safe dispo. Wife and husband both has dementia.    DVT prophylaxis: Lovenox Code Status: Full code Family Communication:  Called his Rosebud Poles, [phone] , wife didn't answer.  Status is: Inpatient Remains inpatient appropriate because: Cont hosp stay, need safe dispo    Subjective: Overall feels ok No complaints.  Nephew tells me he is all over the place and confused at time. He had an identical twin brother who recently passed away a few months ago and his Sister in law passed away couple of months ago   Examination:  General exam: Appears calm and comfortable  Respiratory system: Clear to auscultation. Respiratory effort normal. Cardiovascular system: S1 & S2 heard, RRR. No JVD, murmurs, rubs, gallops or clicks. No pedal edema. Gastrointestinal system: Abdomen is nondistended, soft and nontender. No organomegaly or masses felt. Normal bowel sounds heard. Central nervous system: Alert and oriented name and city Extremities: Symmetric 5 x 5 power. Skin: No rashes, lesions or ulcers Psychiatry: Judgement and insight appear Poor   Diet Orders (From admission, onward)     Start     Ordered   08/12/23 2039  Diet Heart Room service appropriate? Yes; Fluid consistency: Thin  Diet effective now       Question Answer Comment  Room service appropriate? Yes   Fluid consistency: Thin      08/12/23 2046            Objective: Vitals:   08/12/23 2145 08/12/23  2230 08/12/23 2321 08/13/23 0402  BP: (!) 158/84 (!) 156/89 (!) 157/92 114/74  Pulse: 65  67 72  Resp: 19 20 20 17   Temp:  97.7 F (36.5 C) 97.9 F (36.6 C) 98.3 F (36.8 C)  TempSrc:      SpO2: 98% 98% 100% 99%  Weight:      Height:        Intake/Output Summary (Last 24 hours) at 08/13/2023 0826 Last data filed at 08/13/2023 0650 Gross per 24 hour  Intake 1480 ml  Output 600 ml  Net 880 ml   Filed Weights   08/12/23  1423  Weight: 95.3 kg    Scheduled Meds:  enoxaparin (LOVENOX) injection  40 mg Subcutaneous Q24H   folic acid  1 mg Oral Daily   levothyroxine  150 mcg Oral Q0600   multivitamin with minerals  1 tablet Oral Daily   sodium chloride flush  3 mL Intravenous Q12H   tamsulosin  0.4 mg Oral Daily   thiamine  100 mg Oral Daily   Continuous Infusions:  Nutritional status     Body mass index is 25.56 kg/m.  Data Reviewed:   CBC: Recent Labs  Lab 08/12/23 1426 08/13/23 0336  WBC 6.1 5.8  HGB 18.7* 17.6*  HCT 53.4* 49.7  MCV 88.0 88.1  PLT 166 146*   Basic Metabolic Panel: Recent Labs  Lab 08/12/23 1426 08/13/23 0335  NA 136 138  K 4.7 4.2  CL 98 104  CO2 19* 21*  GLUCOSE 95 105*  BUN 26* 26*  CREATININE 1.41* 1.22  CALCIUM 9.5 8.9  MG  --  2.2  PHOS  --  3.2   GFR: Estimated Creatinine Clearance: 51.4 mL/min (by C-G formula based on SCr of 1.22 mg/dL). Liver Function Tests: Recent Labs  Lab 08/12/23 1426 08/13/23 0335  AST 185* 134*  ALT 63* 54*  ALKPHOS 64 55  BILITOT 3.7* 2.6*  PROT 7.4 6.5  ALBUMIN 3.9 3.3*   No results for input(s): "LIPASE", "AMYLASE" in the last 168 hours. Recent Labs  Lab 08/12/23 1549  AMMONIA 15   Coagulation Profile: No results for input(s): "INR", "PROTIME" in the last 168 hours. Cardiac Enzymes: Recent Labs  Lab 08/12/23 1426 08/13/23 0335  CKTOTAL 5,591* 3,423*   BNP (last 3 results) No results for input(s): "PROBNP" in the last 8760 hours. HbA1C: Recent Labs    08/13/23 0336  HGBA1C 5.5   CBG: Recent Labs  Lab 08/12/23 1437 08/12/23 2323  GLUCAP 100* 93   Lipid Profile: No results for input(s): "CHOL", "HDL", "LDLCALC", "TRIG", "CHOLHDL", "LDLDIRECT" in the last 72 hours. Thyroid Function Tests: Recent Labs    08/13/23 0336  TSH 1.065   Anemia Panel: Recent Labs    08/13/23 0336  VITAMINB12 844   Sepsis Labs: Recent Labs  Lab 08/12/23 1450 08/12/23 1745  LATICACIDVEN 2.5* 1.8     No results found for this or any previous visit (from the past 240 hour(s)).       Radiology Studies: CT CHEST ABDOMEN PELVIS W CONTRAST  Result Date: 08/12/2023 CLINICAL DATA:  Found down. Polytrauma, blunt. Altered mental status. Abdominal pain. EXAM: CT CHEST, ABDOMEN, AND PELVIS WITH CONTRAST TECHNIQUE: Multidetector CT imaging of the chest, abdomen and pelvis was performed following the standard protocol during bolus administration of intravenous contrast. RADIATION DOSE REDUCTION: This exam was performed according to the departmental dose-optimization program which includes automated exposure control, adjustment of the mA and/or kV according to patient size and/or  use of iterative reconstruction technique. CONTRAST:  75mL OMNIPAQUE IOHEXOL 350 MG/ML SOLN COMPARISON:  None Available. FINDINGS: CT CHEST FINDINGS Cardiovascular: Heart is normal size. Aorta is normal caliber. Scattered coronary artery and aortic calcifications. Mediastinum/Nodes: No mediastinal, hilar, or axillary adenopathy. Trachea and esophagus are unremarkable. Thyroid unremarkable. Lungs/Pleura: Lungs are clear. No focal airspace opacities or suspicious nodules. No effusions. Musculoskeletal: Chest wall soft tissues are unremarkable. No acute bony abnormality. CT ABDOMEN PELVIS FINDINGS Hepatobiliary: Scattered hypodensities in the liver most compatible with cysts, the largest 2.5 cm in the left hepatic dome. No additional follow-up imaging recommended. Prior cholecystectomy. No biliary ductal dilatation. No perihepatic hematoma. Pancreas: No focal abnormality or ductal dilatation. Spleen: No splenic injury or perisplenic hematoma. Adrenals/Urinary Tract: No adrenal hemorrhage or renal injury identified. Bladder is unremarkable. Stomach/Bowel: Normal appendix. Stomach, large and small bowel grossly unremarkable. Vascular/Lymphatic: Aortic atherosclerosis. No evidence of aneurysm or adenopathy. Reproductive: Radiation seeds in  the region of the prostate. Other: No free fluid or free air. Musculoskeletal: Prior right hip replacement. No acute bony abnormality. IMPRESSION: No acute findings or evidence of significant traumatic injury in the chest, abdomen or pelvis. Coronary artery disease, aortic atherosclerosis. Electronically Signed   By: Charlett Nose M.D.   On: 08/12/2023 19:28   CT Head Wo Contrast  Result Date: 08/12/2023 CLINICAL DATA:  Neck trauma (Age >= 65y); Head trauma, minor (Age >= 65y). Altered mental status. EXAM: CT HEAD WITHOUT CONTRAST CT CERVICAL SPINE WITHOUT CONTRAST TECHNIQUE: Multidetector CT imaging of the head and cervical spine was performed following the standard protocol without intravenous contrast. Multiplanar CT image reconstructions of the cervical spine were also generated. RADIATION DOSE REDUCTION: This exam was performed according to the departmental dose-optimization program which includes automated exposure control, adjustment of the mA and/or kV according to patient size and/or use of iterative reconstruction technique. COMPARISON:  CT head and cervical spine 11/17/2022. FINDINGS: CT HEAD FINDINGS Brain: No acute intracranial hemorrhage. Gray-white differentiation is preserved. No hydrocephalus or extra-axial collection. No mass effect or midline shift. Vascular: No hyperdense vessel or unexpected calcification. Skull: No calvarial fracture or suspicious bone lesion. Skull base is unremarkable. Sinuses/Orbits: No acute finding. Other: None. CT CERVICAL SPINE FINDINGS Alignment: Normal. Skull base and vertebrae: No acute fracture. Normal craniocervical junction. No suspicious bone lesions. Multilevel degenerative disc height loss with degenerative endplate changes. Acquired fusion of the C6-7 level. Soft tissues and spinal canal: No prevertebral fluid or swelling. No visible canal hematoma. Disc levels: Unchanged multilevel cervical spondylosis, worst at C5-6, where there is at least moderate  spinal canal stenosis. Upper chest: No acute findings. Other: Atherosclerotic calcifications of the carotid bulbs. IMPRESSION: 1. No acute intracranial abnormality. 2. No acute cervical spine fracture or traumatic listhesis. 3. Unchanged multilevel cervical spondylosis, worst at C5-6, where there is at least moderate spinal canal stenosis. Electronically Signed   By: Orvan Falconer M.D.   On: 08/12/2023 19:20   CT Cervical Spine Wo Contrast  Result Date: 08/12/2023 CLINICAL DATA:  Neck trauma (Age >= 65y); Head trauma, minor (Age >= 65y). Altered mental status. EXAM: CT HEAD WITHOUT CONTRAST CT CERVICAL SPINE WITHOUT CONTRAST TECHNIQUE: Multidetector CT imaging of the head and cervical spine was performed following the standard protocol without intravenous contrast. Multiplanar CT image reconstructions of the cervical spine were also generated. RADIATION DOSE REDUCTION: This exam was performed according to the departmental dose-optimization program which includes automated exposure control, adjustment of the mA and/or kV according to patient size and/or use of  iterative reconstruction technique. COMPARISON:  CT head and cervical spine 11/17/2022. FINDINGS: CT HEAD FINDINGS Brain: No acute intracranial hemorrhage. Gray-white differentiation is preserved. No hydrocephalus or extra-axial collection. No mass effect or midline shift. Vascular: No hyperdense vessel or unexpected calcification. Skull: No calvarial fracture or suspicious bone lesion. Skull base is unremarkable. Sinuses/Orbits: No acute finding. Other: None. CT CERVICAL SPINE FINDINGS Alignment: Normal. Skull base and vertebrae: No acute fracture. Normal craniocervical junction. No suspicious bone lesions. Multilevel degenerative disc height loss with degenerative endplate changes. Acquired fusion of the C6-7 level. Soft tissues and spinal canal: No prevertebral fluid or swelling. No visible canal hematoma. Disc levels: Unchanged multilevel cervical  spondylosis, worst at C5-6, where there is at least moderate spinal canal stenosis. Upper chest: No acute findings. Other: Atherosclerotic calcifications of the carotid bulbs. IMPRESSION: 1. No acute intracranial abnormality. 2. No acute cervical spine fracture or traumatic listhesis. 3. Unchanged multilevel cervical spondylosis, worst at C5-6, where there is at least moderate spinal canal stenosis. Electronically Signed   By: Orvan Falconer M.D.   On: 08/12/2023 19:20   DG Chest Port 1 View  Result Date: 08/12/2023 CLINICAL DATA:  Shoulder pain EXAM: PORTABLE CHEST 1 VIEW COMPARISON:  11/17/2022 FINDINGS: The heart size and mediastinal contours are within normal limits. Aortic atherosclerosis. Both lungs are clear. The visualized skeletal structures are unremarkable. IMPRESSION: No active disease. Electronically Signed   By: Jasmine Pang M.D.   On: 08/12/2023 17:56   DG Shoulder Right  Result Date: 08/12/2023 CLINICAL DATA:  Shoulder pain EXAM: RIGHT SHOULDER - 2+ VIEW COMPARISON:  None Available. FINDINGS: No fracture or malalignment. Chronic AC joint widening and mild deformity. Mild degenerative change at the glenohumeral interval. IMPRESSION: No acute osseous abnormality Electronically Signed   By: Jasmine Pang M.D.   On: 08/12/2023 17:55   DG Shoulder Left  Result Date: 08/12/2023 CLINICAL DATA:  Patient was found lying on the floor of his bathroom. Shoulder pain. EXAM: LEFT SHOULDER - 2+ VIEW COMPARISON:  None Available. FINDINGS: No acute fracture or dislocation. Superior subluxation of the humeral head in relation to the glenoid likely due to rotator cuff pathology. Degenerative arthritis AC and glenohumeral joints. IMPRESSION: 1. No acute fracture or dislocation. 2. Findings compatible with rotator cuff pathology. Electronically Signed   By: Minerva Fester M.D.   On: 08/12/2023 17:53           LOS: 1 day   Time spent= 35 mins    Miguel Rota, MD [facility]  If  7PM-7AM, please contact night-coverage  08/13/2023, 8:26 AM

## 2023-08-13 NOTE — Progress Notes (Signed)
1115-Spoke to patient's nephew on the phone. He is trying to find patient's wife because she was taken to the hospital as well, according to the information he was given. Patient's nephew lives out of state but is emergency contact for patient.

## 2023-08-13 NOTE — Progress Notes (Signed)
Mobility Specialist Progress Note    08/13/23 1138  Mobility  Activity Ambulated with assistance in hallway  Level of Assistance Minimal assist, patient does 75% or more  Assistive Device Front wheel walker  Distance Ambulated (ft) 80 ft  Activity Response Tolerated well  Mobility Referral Yes  $Mobility charge 1 Mobility  Mobility Specialist Start Time (ACUTE ONLY) 1121  Mobility Specialist Stop Time (ACUTE ONLY) 1138  Mobility Specialist Time Calculation (min) (ACUTE ONLY) 17 min   Pre-Mobility: 69 HR  Pt received in bed and agreeable. Pt c/o pain and soreness around ribs with movement. Pt required verbal cues for weight-shifting and RW management. Returned to bed with call bell in reach and alarm on. RN aware.   [place] Mobility Specialist  Please Neurosurgeon or Rehab Office at [phone]

## 2023-08-13 NOTE — Progress Notes (Signed)
Scheryl Marten, niece, called patient's wife is in [facility]

## 2023-08-13 NOTE — Plan of Care (Signed)
  Problem: Pain Managment: Goal: General experience of comfort will improve Outcome: Progressing   Problem: Safety: Goal: Ability to remain free from injury will improve Outcome: Progressing   

## 2023-08-14 DIAGNOSIS — E039 Hypothyroidism, unspecified: Secondary | ICD-10-CM | POA: Diagnosis not present

## 2023-08-14 DIAGNOSIS — W1830XA Fall on same level, unspecified, initial encounter: Secondary | ICD-10-CM | POA: Diagnosis not present

## 2023-08-14 DIAGNOSIS — E785 Hyperlipidemia, unspecified: Secondary | ICD-10-CM | POA: Diagnosis not present

## 2023-08-14 LAB — COMPREHENSIVE METABOLIC PANEL
ALT: 50 U/L — ABNORMAL HIGH (ref 0–44)
AST: 97 U/L — ABNORMAL HIGH (ref 15–41)
Albumin: 3.1 g/dL — ABNORMAL LOW (ref 3.5–5.0)
Alkaline Phosphatase: 53 U/L (ref 38–126)
Anion gap: 10 (ref 5–15)
BUN: 19 mg/dL (ref 8–23)
CO2: 22 mmol/L (ref 22–32)
Calcium: 9.1 mg/dL (ref 8.9–10.3)
Chloride: 105 mmol/L (ref 98–111)
Creatinine, Ser: 1.02 mg/dL (ref 0.61–1.24)
GFR, Estimated: >60 mL/min (ref >60)
Glucose, Bld: 99 mg/dL (ref 70–99)
Potassium: 4.6 mmol/L (ref 3.5–5.1)
Sodium: 137 mmol/L (ref 135–145)
Total Bilirubin: 1.8 mg/dL — ABNORMAL HIGH (ref 0.3–1.2)
Total Protein: 6 g/dL — ABNORMAL LOW (ref 6.5–8.1)

## 2023-08-14 LAB — CBC
HCT: 48.2 % (ref 39.0–52.0)
Hemoglobin: 16.7 g/dL (ref 13.0–17.0)
MCH: 30.3 pg (ref 26.0–34.0)
MCHC: 34.6 g/dL (ref 30.0–36.0)
MCV: 87.5 fL (ref 80.0–100.0)
Platelets: 157 10*3/uL (ref 150–400)
RBC: 5.51 MIL/uL (ref 4.22–5.81)
RDW: 13.1 % (ref 11.5–15.5)
WBC: 4.2 10*3/uL (ref 4.0–10.5)
nRBC: 0 % (ref 0.0–0.2)

## 2023-08-14 LAB — CK: Total CK: 1464 U/L — ABNORMAL HIGH (ref 49–397)

## 2023-08-14 LAB — MAGNESIUM: Magnesium: 1.9 mg/dL (ref 1.7–2.4)

## 2023-08-14 MED ORDER — ERYTHROMYCIN 5 MG/GM OP OINT
TOPICAL_OINTMENT | Freq: Three times a day (TID) | OPHTHALMIC | Status: DC
  Administered 2023-08-14: 1 via OPHTHALMIC
  Filled 2023-08-14 (×2): qty 3.5

## 2023-08-14 NOTE — Progress Notes (Signed)
Mobility Specialist Progress Note    08/14/23 1009  Mobility  Activity Ambulated with assistance in hallway  Level of Assistance Minimal assist, patient does 75% or more  Assistive Device Four wheel walker  Distance Ambulated (ft) 110 ft  Activity Response Tolerated well  Mobility Referral Yes  $Mobility charge 1 Mobility  Mobility Specialist Start Time (ACUTE ONLY) [id number]  Mobility Specialist Stop Time (ACUTE ONLY) 1007  Mobility Specialist Time Calculation (min) (ACUTE ONLY) 11 min   Pt received in bed and agreeable. No complaints on walk. Returned to bathroom to attempt BM. Encouraged to pull string when ready to get up. RN notified.    [place] Mobility Specialist  Please Neurosurgeon or Rehab Office at [phone]

## 2023-08-14 NOTE — Evaluation (Signed)
Occupational Therapy Evaluation Patient Details Name: [name] [name] MRN: [id number] DOB: 21-May-1932 Today's Date: 08/14/2023   History of Present Illness The pt is an 89 yo male presenting 10/11 after he was found down for an unknown period of time during a wellness check. Pt found to have no acute bony injury, but does have acute encephalopathy on possible underlying neurocognitive disorder/dementia, dehydration, and rhabdomyolysis. PMH includes: frequent falls, HTN, prostate cancer, and hip surgery.   Clinical Impression   Pt currently min assist for selfcare tasks sit to stand, simulated toilet transfers, and grooming tasks at the sink in standing.  Prior to admission pt lived with his spouse and was modified independent however, he did have history of falls.  Feel based on his spouse not being able to provide physical assist that pt will need to be modified independent to return home.  Recommend acute care OT at this time to help progress ADL status, safety, and balance.  Feel pt will likely benefit from continued rehab at inpatient facility less than 3 hrs/day in order to reach PLOF and help reduce risk of further falls.        If plan is discharge home, recommend the following: A little help with walking and/or transfers;Assistance with cooking/housework;Assist for transportation;Help with stairs or ramp for entrance    Functional Status Assessment  Patient has had a recent decline in their functional status and demonstrates the ability to make significant improvements in function in a reasonable and predictable amount of time.  Equipment Recommendations  None recommended by OT       Precautions / Restrictions Precautions Precautions: Fall Restrictions Weight Bearing Restrictions: No      Mobility Bed Mobility Overal bed mobility: Needs Assistance Bed Mobility: Supine to Sit     Supine to sit: Min assist, Used rails          Transfers Overall transfer level: Needs  assistance Equipment used: Rolling walker (2 wheels) Transfers: Sit to/from Stand, Bed to chair/wheelchair/BSC Sit to Stand: Min assist     Step pivot transfers: Min assist     General transfer comment: Min instructional cueing for hand placement with sit to stand.      Balance Overall balance assessment: Needs assistance Sitting-balance support: No upper extremity supported, Feet supported Sitting balance-Leahy Scale: Fair     Standing balance support: Bilateral upper extremity supported, During functional activity Standing balance-Leahy Scale: Poor Standing balance comment: Needs UE support for balance                           ADL either performed or assessed with clinical judgement   ADL Overall ADL's : Needs assistance/impaired Eating/Feeding: Independent;Sitting   Grooming: Wash/dry hands;Wash/dry face;Minimal assistance;Standing   Upper Body Bathing: Set up;Sitting   Lower Body Bathing: Minimal assistance;Sit to/from stand   Upper Body Dressing : Supervision/safety;Sitting   Lower Body Dressing: Minimal assistance;Sit to/from stand   Toilet Transfer: Minimal assistance;Ambulation;Regular Toilet;Grab bars   Toileting- Clothing Manipulation and Hygiene: Minimal assistance;Sit to/from stand       Functional mobility during ADLs: Minimal assistance;Rolling walker (2 wheels)       Vision Baseline Vision/History: 0 No visual deficits Patient Visual Report: No change from baseline Vision Assessment?: No apparent visual deficits     Perception Perception: Within Functional Limits       Praxis Praxis: WFL       Pertinent Vitals/Pain Pain Assessment Pain Assessment: 0-10 Faces Pain Scale: Hurts  little more Pain Location: R ribs/side of chest Pain Descriptors / Indicators: Discomfort, Grimacing, Guarding Pain Intervention(s): Limited activity within patient's tolerance, Monitored during session, Repositioned     Extremity/Trunk Assessment  Upper Extremity Assessment Upper Extremity Assessment: Overall WFL for tasks assessed   Lower Extremity Assessment Lower Extremity Assessment: Defer to PT evaluation   Cervical / Trunk Assessment Cervical / Trunk Assessment: Kyphotic   Communication Communication Communication: No apparent difficulties   Cognition Arousal: Alert Behavior During Therapy: WFL for tasks assessed/performed Overall Cognitive Status: No family/caregiver present to determine baseline cognitive functioning                                 General Comments: Pt oriented to time (month, day of the week), but not oriented to the name of the hospital.                Home Living Family/patient expects to be discharged to:: Private residence Living Arrangements: Spouse/significant other Available Help at Discharge: Family;Other (Comment) (pt lives with wife, reports she is unable to help physically and falls as well) Type of Home: Apartment Home Access: Level entry     Home Layout: One level     Bathroom Shower/Tub: Chief Strategy Officer: Standard     Home Equipment: Agricultural consultant (2 wheels);Cane - single point;Wheelchair - manual;Shower seat;BSC/3in1;Toilet riser   Additional Comments: pt reports from home wiht wife, falls often. no family in state other than wife's neice. pt also with some dicrepancies in answers vs chart, no family present to confirm      Prior Functioning/Environment Prior Level of Function : Independent/Modified Independent;Driving;History of Falls (last six months)               ADLs Comments: pt reports independent, wife manages medications, pt does driving.        OT Problem List: Decreased strength;Decreased activity tolerance;Impaired balance (sitting and/or standing);Decreased knowledge of use of DME or AE;Decreased safety awareness;Pain      OT Treatment/Interventions: Self-care/ADL training;DME and/or AE instruction;Therapeutic  activities;Balance training;Patient/family education    OT Goals(Current goals can be found in the care plan section) Acute Rehab OT Goals Patient Stated Goal: Pt did not state but agreeable to participation in OT session. OT Goal Formulation: With patient Time For Goal Achievement: 08/28/23 Potential to Achieve Goals: Good  OT Frequency: Min 1X/week       AM-PAC OT "6 Clicks" Daily Activity     Outcome Measure Help from another person eating meals?: None Help from another person taking care of personal grooming?: A Little Help from another person toileting, which includes using toliet, bedpan, or urinal?: A Little Help from another person bathing (including washing, rinsing, drying)?: A Little Help from another person to put on and taking off regular upper body clothing?: A Little Help from another person to put on and taking off regular lower body clothing?: A Little 6 Click Score: 19   End of Session Equipment Utilized During Treatment: Gait belt;Rolling walker (2 wheels)  Activity Tolerance: Patient tolerated treatment well Patient left: in chair;with call bell/phone within reach;with chair alarm set  OT Visit Diagnosis: Unsteadiness on feet (R26.81);Other abnormalities of gait and mobility (R26.89);Repeated falls (R29.6);Pain;Muscle weakness (generalized) (M62.81) Pain - Right/Left: Right (flank area)                Time: 1027-2536 OT Time Calculation (min): 25 min Charges:  OT General  Charges $OT Visit: 1 Visit OT Evaluation $OT Eval Moderate Complexity: 1 Mod OT Treatments $Self Care/Home Management : 8-22 mins  Perrin Maltese, OTR/L Acute Rehabilitation Services  Office [phone] 08/14/2023

## 2023-08-14 NOTE — Hospital Course (Addendum)
  Brief Narrative:  89 year old with history of A-fib on Eliquis, DM 2, HTN, HLD, hypothyroidism, CHF with preserved EF, prostate cancer, prior DVT comes to the ED after a wellness check and he was found on the ground at home.  Patient does not recall any events leading up to this.  Thinks he may have fallen 2 days prior to coming to the hospital.  Rhabdomyoma with IV fluids.     Assessment & Plan:  Principal Problem:   Ground-level fall    Ground-level fall, unknown downtime Unknown downtime.  CT head and cervical spine are negative.  UA negative.  TSH, ammonia, B12 are normal. PT/OT recommending SNF.   Mild cognitive impairment Possibly underlying dementia which has been progressing.  Trauma workup negative.  No focal neurodeficits.  B12 was normal.   Rhabdomyolysis CK 5591, continuing to improve with IV fluids   Lactic acidosis -resolved   Elevated troponin EKG with a flutter, no acute ischemic change.  Initial troponin 92, remains flat and trending down.   Hyperbilirubinemia  Transaminitis, improving Elevated AST compared to ALT and total bilirubin.  Wonder if he has history of alcohol abuse?Marland Kitchen  Will trend LFTs, acute hepatitis panel. 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  Admission creatinine 1.4 now close to baseline of 1.0   Erythrocytosis, presumed 2/2 hemoconcentration  Resolved   A- flutter:  Not on any AV nodal blocker.  Continue Eliquis   HFpEF:  Currently hypovolemic, not on diuretics at home.  Currently appears dehydrated therefore fluids   Hypertension:  Hold home lisinopril.  IV as needed   Hypothyroidism:  Continues home levothyroxine.  Check TSH   HLD:  Does not appear still taking statin   History of diabetes:  A1c 5.5.  Sliding scale and Accu-Cheks   History of prostate cancer, radiation seeds   Prior DVT: On Eliquis   PT/OT We will defnitely need safe dispo. Wife and husband  both has dementia.      DVT prophylaxis: Lovenox Code Status: Full code Family Communication: Nephew updated periodically Status is: Inpatient Remains inpatient appropriate because: Cont hosp stay, need safe dispo

## 2023-08-14 NOTE — Progress Notes (Signed)
PROGRESS NOTE    [name] [name]  VQQ:[id number] DOB: 1932-01-09 DOA: 08/12/2023 PCP: Knox Royalty, MD     Brief Narrative:  89 year old with history of A-fib on Eliquis, DM 2, HTN, HLD, hypothyroidism, CHF with preserved EF, prostate cancer, prior DVT comes to the ED after a wellness check and he was found on the ground at home.  Patient does not recall any events leading up to this.  Thinks he may have fallen 2 days prior to coming to the hospital.  Rhabdomyoma with IV fluids.     Assessment & Plan:  Principal Problem:   Ground-level fall    Ground-level fall, unknown downtime Unknown downtime.  CT head and cervical spine are negative.  UA negative.  TSH, ammonia, B12 are normal. PT/OT recommending SNF.   Mild cognitive impairment Possibly underlying dementia which has been progressing.  Trauma workup negative.  No focal neurodeficits.  B12 was normal.   Rhabdomyolysis CK 5591, continuing to improve with IV fluids   Lactic acidosis -resolved   Elevated troponin EKG with a flutter, no acute ischemic change.  Initial troponin 92, remains flat and trending down.   Hyperbilirubinemia  Transaminitis, improving Elevated AST compared to ALT and total bilirubin.  Wonder if he has history of alcohol abuse?Marland Kitchen  Will trend LFTs, acute hepatitis panel. 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  Admission creatinine 1.4 now close to baseline of 1.0   Erythrocytosis, presumed 2/2 hemoconcentration  Resolved   A- flutter:  Not on any AV nodal blocker.  Continue Eliquis   HFpEF:  Currently hypovolemic, not on diuretics at home.  Currently appears dehydrated therefore fluids   Hypertension:  Hold home lisinopril.  IV as needed   Hypothyroidism:  Continues home levothyroxine.  Check TSH   HLD:  Does not appear still taking statin   History of diabetes:  A1c 5.5.  Sliding scale and Accu-Cheks   History of prostate  cancer, radiation seeds   Prior DVT: On Eliquis   PT/OT We will defnitely need safe dispo. Wife and husband both has dementia.      DVT prophylaxis: Lovenox Code Status: Full code Family Communication: Nephew updated periodically Status is: Inpatient Remains inpatient appropriate because: Cont hosp stay, need safe dispo               Subjective: Patient sitting up in the recliner.  Able to answer basic questions.  No complaints at this time. He is able to answer all the basic questions in regards to his family  Examination:  General exam: Appears calm and comfortable  Respiratory system: Clear to auscultation. Respiratory effort normal. Cardiovascular system: S1 & S2 heard, RRR. No JVD, murmurs, rubs, gallops or clicks. No pedal edema. Gastrointestinal system: Abdomen is nondistended, soft and nontender. No organomegaly or masses felt. Normal bowel sounds heard. Central nervous system: Alert and oriented to name and place Extremities: Symmetric 5 x 5 power. Skin: No rashes, lesions or ulcers Psychiatry: Judgement and insight appear poor       Diet Orders (From admission, onward)     Start     Ordered   08/12/23 2039  Diet Heart Room service appropriate? Yes; Fluid consistency: Thin  Diet effective now       Question Answer Comment  Room service appropriate? Yes   Fluid consistency: Thin      08/12/23 2046  Objective: Vitals:   08/13/23 0402 08/13/23 0914 08/13/23 1654 08/13/23 2300  BP: 114/74 114/64 (!) 152/90 (!) 150/82  Pulse: 72 70 62 65  Resp: 17 16 16 18   Temp: 98.3 F (36.8 C) 98.2 F (36.8 C) 97.6 F (36.4 C) (!) 97.5 F (36.4 C)  TempSrc:  Oral Oral Oral  SpO2: 99% 98% 100% 100%  Weight:      Height:        Intake/Output Summary (Last 24 hours) at 08/14/2023 1125 Last data filed at 08/13/2023 1654 Gross per 24 hour  Intake --  Output 275 ml  Net -275 ml   Filed Weights   08/12/23 1423  Weight: 95.3 kg     Scheduled Meds:  enoxaparin (LOVENOX) injection  40 mg Subcutaneous Q24H   erythromycin   Both Eyes Q8H   folic acid  1 mg Oral Daily   levothyroxine  150 mcg Oral Q0600   multivitamin with minerals  1 tablet Oral Daily   sodium chloride flush  3 mL Intravenous Q12H   tamsulosin  0.4 mg Oral Daily   thiamine  100 mg Oral Daily   Continuous Infusions:  Nutritional status     Body mass index is 25.56 kg/m.  Data Reviewed:   CBC: Recent Labs  Lab 08/12/23 1426 08/13/23 0336 08/14/23 0532  WBC 6.1 5.8 4.2  HGB 18.7* 17.6* 16.7  HCT 53.4* 49.7 48.2  MCV 88.0 88.1 87.5  PLT 166 146* 157   Basic Metabolic Panel: Recent Labs  Lab 08/12/23 1426 08/13/23 0335 08/14/23 0532  NA 136 138 137  K 4.7 4.2 4.6  CL 98 104 105  CO2 19* 21* 22  GLUCOSE 95 105* 99  BUN 26* 26* 19  CREATININE 1.41* 1.22 1.02  CALCIUM 9.5 8.9 9.1  MG  --  2.2 1.9  PHOS  --  3.2  --    GFR: Estimated Creatinine Clearance: 61.5 mL/min (by C-G formula based on SCr of 1.02 mg/dL). Liver Function Tests: Recent Labs  Lab 08/12/23 1426 08/13/23 0335 08/14/23 0532  AST 185* 134* 97*  ALT 63* 54* 50*  ALKPHOS 64 55 53  BILITOT 3.7* 2.6* 1.8*  PROT 7.4 6.5 6.0*  ALBUMIN 3.9 3.3* 3.1*   No results for input(s): "LIPASE", "AMYLASE" in the last 168 hours. Recent Labs  Lab 08/12/23 1549  AMMONIA 15   Coagulation Profile: No results for input(s): "INR", "PROTIME" in the last 168 hours. Cardiac Enzymes: Recent Labs  Lab 08/12/23 1426 08/13/23 0335 08/14/23 0532  CKTOTAL 5,591* 3,423* 1,464*   BNP (last 3 results) No results for input(s): "PROBNP" in the last 8760 hours. HbA1C: Recent Labs    08/13/23 0336  HGBA1C 5.5   CBG: Recent Labs  Lab 08/12/23 1437 08/12/23 2323  GLUCAP 100* 93   Lipid Profile: No results for input(s): "CHOL", "HDL", "LDLCALC", "TRIG", "CHOLHDL", "LDLDIRECT" in the last 72 hours. Thyroid Function Tests: Recent Labs    08/13/23 0336  TSH 1.065    Anemia Panel: Recent Labs    08/13/23 0336  VITAMINB12 844   Sepsis Labs: Recent Labs  Lab 08/12/23 1450 08/12/23 1745  LATICACIDVEN 2.5* 1.8    No results found for this or any previous visit (from the past 240 hour(s)).       Radiology Studies: CT CHEST ABDOMEN PELVIS W CONTRAST  Result Date: 08/12/2023 CLINICAL DATA:  Found down. Polytrauma, blunt. Altered mental status. Abdominal pain. EXAM: CT CHEST, ABDOMEN, AND PELVIS WITH CONTRAST TECHNIQUE: Multidetector  CT imaging of the chest, abdomen and pelvis was performed following the standard protocol during bolus administration of intravenous contrast. RADIATION DOSE REDUCTION: This exam was performed according to the departmental dose-optimization program which includes automated exposure control, adjustment of the mA and/or kV according to patient size and/or use of iterative reconstruction technique. CONTRAST:  75mL OMNIPAQUE IOHEXOL 350 MG/ML SOLN COMPARISON:  None Available. FINDINGS: CT CHEST FINDINGS Cardiovascular: Heart is normal size. Aorta is normal caliber. Scattered coronary artery and aortic calcifications. Mediastinum/Nodes: No mediastinal, hilar, or axillary adenopathy. Trachea and esophagus are unremarkable. Thyroid unremarkable. Lungs/Pleura: Lungs are clear. No focal airspace opacities or suspicious nodules. No effusions. Musculoskeletal: Chest wall soft tissues are unremarkable. No acute bony abnormality. CT ABDOMEN PELVIS FINDINGS Hepatobiliary: Scattered hypodensities in the liver most compatible with cysts, the largest 2.5 cm in the left hepatic dome. No additional follow-up imaging recommended. Prior cholecystectomy. No biliary ductal dilatation. No perihepatic hematoma. Pancreas: No focal abnormality or ductal dilatation. Spleen: No splenic injury or perisplenic hematoma. Adrenals/Urinary Tract: No adrenal hemorrhage or renal injury identified. Bladder is unremarkable. Stomach/Bowel: Normal appendix. Stomach,  large and small bowel grossly unremarkable. Vascular/Lymphatic: Aortic atherosclerosis. No evidence of aneurysm or adenopathy. Reproductive: Radiation seeds in the region of the prostate. Other: No free fluid or free air. Musculoskeletal: Prior right hip replacement. No acute bony abnormality. IMPRESSION: No acute findings or evidence of significant traumatic injury in the chest, abdomen or pelvis. Coronary artery disease, aortic atherosclerosis. Electronically Signed   By: Charlett Nose M.D.   On: 08/12/2023 19:28   CT Head Wo Contrast  Result Date: 08/12/2023 CLINICAL DATA:  Neck trauma (Age >= 65y); Head trauma, minor (Age >= 65y). Altered mental status. EXAM: CT HEAD WITHOUT CONTRAST CT CERVICAL SPINE WITHOUT CONTRAST TECHNIQUE: Multidetector CT imaging of the head and cervical spine was performed following the standard protocol without intravenous contrast. Multiplanar CT image reconstructions of the cervical spine were also generated. RADIATION DOSE REDUCTION: This exam was performed according to the departmental dose-optimization program which includes automated exposure control, adjustment of the mA and/or kV according to patient size and/or use of iterative reconstruction technique. COMPARISON:  CT head and cervical spine 11/17/2022. FINDINGS: CT HEAD FINDINGS Brain: No acute intracranial hemorrhage. Gray-white differentiation is preserved. No hydrocephalus or extra-axial collection. No mass effect or midline shift. Vascular: No hyperdense vessel or unexpected calcification. Skull: No calvarial fracture or suspicious bone lesion. Skull base is unremarkable. Sinuses/Orbits: No acute finding. Other: None. CT CERVICAL SPINE FINDINGS Alignment: Normal. Skull base and vertebrae: No acute fracture. Normal craniocervical junction. No suspicious bone lesions. Multilevel degenerative disc height loss with degenerative endplate changes. Acquired fusion of the C6-7 level. Soft tissues and spinal canal: No  prevertebral fluid or swelling. No visible canal hematoma. Disc levels: Unchanged multilevel cervical spondylosis, worst at C5-6, where there is at least moderate spinal canal stenosis. Upper chest: No acute findings. Other: Atherosclerotic calcifications of the carotid bulbs. IMPRESSION: 1. No acute intracranial abnormality. 2. No acute cervical spine fracture or traumatic listhesis. 3. Unchanged multilevel cervical spondylosis, worst at C5-6, where there is at least moderate spinal canal stenosis. Electronically Signed   By: Orvan Falconer M.D.   On: 08/12/2023 19:20   CT Cervical Spine Wo Contrast  Result Date: 08/12/2023 CLINICAL DATA:  Neck trauma (Age >= 65y); Head trauma, minor (Age >= 65y). Altered mental status. EXAM: CT HEAD WITHOUT CONTRAST CT CERVICAL SPINE WITHOUT CONTRAST TECHNIQUE: Multidetector CT imaging of the head and cervical spine was performed  following the standard protocol without intravenous contrast. Multiplanar CT image reconstructions of the cervical spine were also generated. RADIATION DOSE REDUCTION: This exam was performed according to the departmental dose-optimization program which includes automated exposure control, adjustment of the mA and/or kV according to patient size and/or use of iterative reconstruction technique. COMPARISON:  CT head and cervical spine 11/17/2022. FINDINGS: CT HEAD FINDINGS Brain: No acute intracranial hemorrhage. Gray-white differentiation is preserved. No hydrocephalus or extra-axial collection. No mass effect or midline shift. Vascular: No hyperdense vessel or unexpected calcification. Skull: No calvarial fracture or suspicious bone lesion. Skull base is unremarkable. Sinuses/Orbits: No acute finding. Other: None. CT CERVICAL SPINE FINDINGS Alignment: Normal. Skull base and vertebrae: No acute fracture. Normal craniocervical junction. No suspicious bone lesions. Multilevel degenerative disc height loss with degenerative endplate changes. Acquired  fusion of the C6-7 level. Soft tissues and spinal canal: No prevertebral fluid or swelling. No visible canal hematoma. Disc levels: Unchanged multilevel cervical spondylosis, worst at C5-6, where there is at least moderate spinal canal stenosis. Upper chest: No acute findings. Other: Atherosclerotic calcifications of the carotid bulbs. IMPRESSION: 1. No acute intracranial abnormality. 2. No acute cervical spine fracture or traumatic listhesis. 3. Unchanged multilevel cervical spondylosis, worst at C5-6, where there is at least moderate spinal canal stenosis. Electronically Signed   By: Orvan Falconer M.D.   On: 08/12/2023 19:20   DG Chest Port 1 View  Result Date: 08/12/2023 CLINICAL DATA:  Shoulder pain EXAM: PORTABLE CHEST 1 VIEW COMPARISON:  11/17/2022 FINDINGS: The heart size and mediastinal contours are within normal limits. Aortic atherosclerosis. Both lungs are clear. The visualized skeletal structures are unremarkable. IMPRESSION: No active disease. Electronically Signed   By: Jasmine Pang M.D.   On: 08/12/2023 17:56   DG Shoulder Right  Result Date: 08/12/2023 CLINICAL DATA:  Shoulder pain EXAM: RIGHT SHOULDER - 2+ VIEW COMPARISON:  None Available. FINDINGS: No fracture or malalignment. Chronic AC joint widening and mild deformity. Mild degenerative change at the glenohumeral interval. IMPRESSION: No acute osseous abnormality Electronically Signed   By: Jasmine Pang M.D.   On: 08/12/2023 17:55   DG Shoulder Left  Result Date: 08/12/2023 CLINICAL DATA:  Patient was found lying on the floor of his bathroom. Shoulder pain. EXAM: LEFT SHOULDER - 2+ VIEW COMPARISON:  None Available. FINDINGS: No acute fracture or dislocation. Superior subluxation of the humeral head in relation to the glenoid likely due to rotator cuff pathology. Degenerative arthritis AC and glenohumeral joints. IMPRESSION: 1. No acute fracture or dislocation. 2. Findings compatible with rotator cuff pathology. Electronically  Signed   By: Minerva Fester M.D.   On: 08/12/2023 17:53           LOS: 2 days   Time spent= 35 mins    Miguel Rota, MD [facility]  If 7PM-7AM, please contact night-coverage  08/14/2023, 11:25 AM

## 2023-08-14 NOTE — Plan of Care (Signed)
  Problem: Pain Managment: Goal: General experience of comfort will improve Outcome: Progressing   Problem: Safety: Goal: Ability to remain free from injury will improve Outcome: Progressing   

## 2023-08-14 NOTE — Plan of Care (Signed)
Problem: Education: Goal: Knowledge of risk factors and measures for prevention of condition will improve Outcome: Progressing   Problem: Safety: Goal: Ability to remain free from injury will improve Outcome: Progressing

## 2023-08-15 DIAGNOSIS — E039 Hypothyroidism, unspecified: Secondary | ICD-10-CM | POA: Diagnosis not present

## 2023-08-15 DIAGNOSIS — W1830XA Fall on same level, unspecified, initial encounter: Secondary | ICD-10-CM | POA: Diagnosis not present

## 2023-08-15 DIAGNOSIS — E785 Hyperlipidemia, unspecified: Secondary | ICD-10-CM | POA: Diagnosis not present

## 2023-08-15 LAB — COMPREHENSIVE METABOLIC PANEL
ALT: 49 U/L — ABNORMAL HIGH (ref 0–44)
AST: 79 U/L — ABNORMAL HIGH (ref 15–41)
Albumin: 3.2 g/dL — ABNORMAL LOW (ref 3.5–5.0)
Alkaline Phosphatase: 50 U/L (ref 38–126)
Anion gap: 11 (ref 5–15)
BUN: 15 mg/dL (ref 8–23)
CO2: 21 mmol/L — ABNORMAL LOW (ref 22–32)
Calcium: 9.3 mg/dL (ref 8.9–10.3)
Chloride: 105 mmol/L (ref 98–111)
Creatinine, Ser: 0.95 mg/dL (ref 0.61–1.24)
GFR, Estimated: >60 mL/min (ref >60)
Glucose, Bld: 102 mg/dL — ABNORMAL HIGH (ref 70–99)
Potassium: 4.5 mmol/L (ref 3.5–5.1)
Sodium: 137 mmol/L (ref 135–145)
Total Bilirubin: 1.8 mg/dL — ABNORMAL HIGH (ref 0.3–1.2)
Total Protein: 6.3 g/dL — ABNORMAL LOW (ref 6.5–8.1)

## 2023-08-15 LAB — CBC
HCT: 49 % (ref 39.0–52.0)
Hemoglobin: 17.4 g/dL — ABNORMAL HIGH (ref 13.0–17.0)
MCH: 30.9 pg (ref 26.0–34.0)
MCHC: 35.5 g/dL (ref 30.0–36.0)
MCV: 86.9 fL (ref 80.0–100.0)
Platelets: 179 10*3/uL (ref 150–400)
RBC: 5.64 MIL/uL (ref 4.22–5.81)
RDW: 12.9 % (ref 11.5–15.5)
WBC: 4 10*3/uL (ref 4.0–10.5)
nRBC: 0 % (ref 0.0–0.2)

## 2023-08-15 LAB — CK: Total CK: 950 U/L — ABNORMAL HIGH (ref 49–397)

## 2023-08-15 LAB — MAGNESIUM: Magnesium: 1.9 mg/dL (ref 1.7–2.4)

## 2023-08-15 NOTE — [place] FL2 (Signed)
[place] MEDICAID FL2 LEVEL OF CARE FORM     IDENTIFICATION  Patient Name: [name] [name] Birthdate: Jan 22, 1932 Sex: male Admission Date (Current Location): 08/12/2023  [facility] and [place] Number:  Producer, television/film/video and Address:  [facility] [place], [address] [place], [place] [address]      Provider Number: [id number]  Attending Physician Name and Address:  Miguel Rota, MD  Relative Name and Phone Number:  Greggory Stallion (nephew) [phone]    Current Level of Care: Hospital Recommended Level of Care: Skilled Nursing Facility Prior Approval Number:    Date Approved/Denied:   PASRR Number: [id number]  Discharge Plan: SNF    Current Diagnoses: Patient Active Problem List   Diagnosis Date Noted   Ground-level fall 08/12/2023   Falls frequently 11/17/2022   Atrial fibrillation (HCC) 11/17/2022   COVID-19 virus infection 11/17/2022   B12 deficiency 03/10/2021   DIZZINESS 08/28/2009   SHOULDER PAIN, LEFT 08/25/2009   FATIGUE 08/25/2009   Type 2 diabetes mellitus without complication, without long-term current use of insulin (HCC) 03/05/2008   HYPOGONADISM, MALE 03/05/2008   CONDUCTIVE HEARING LOSS BILATERAL 03/05/2008   Allergic rhinitis 03/05/2008   NEOPLASM, MALIGNANT, PROSTATE 09/05/2007   Hypothyroidism 09/05/2007   HYPERLIPIDEMIA 09/05/2007   ANEMIA-IRON DEFICIENCY 09/05/2007   Anxiety state 09/05/2007   ERECTILE DYSFUNCTION 09/05/2007   DEPRESSION 09/05/2007   Migraine without aura 09/05/2007   Essential hypertension 09/05/2007   BRADYCARDIA, CHRONIC 09/05/2007   GERD 09/05/2007   SWELLING MASS OR LUMP IN HEAD AND NECK 09/05/2007   GLYCOSURIA 09/05/2007   DECREASED LIBIDO 09/05/2007   GLUCOSE INTOLERANCE, HX OF 09/05/2007   History of cardiovascular disorder 09/05/2007   VOCAL CORD POLYP, HX OF 09/05/2007    Orientation RESPIRATION BLADDER Height & Weight     Self, Time, Situation, Place (WDL)  Normal Continent Weight: 210 lb  (95.3 kg) Height:  6\' 4"  (193 cm)  BEHAVIORAL SYMPTOMS/MOOD NEUROLOGICAL BOWEL NUTRITION STATUS      Continent Diet (Please see Discharge Summary)  AMBULATORY STATUS COMMUNICATION OF NEEDS Skin   Limited Assist Verbally Other (Comment) (Abrasion,Knee,Nose,Bil.,Wound/Incision LDAs)                       Personal Care Assistance Level of Assistance  Bathing, Feeding, Dressing Bathing Assistance: Limited assistance Feeding assistance: Independent Dressing Assistance: Limited assistance     Functional Limitations Info  Sight, Hearing, Speech Sight Info: Adequate (WDL) Hearing Info: Adequate Speech Info: Adequate    SPECIAL CARE FACTORS FREQUENCY  PT (By licensed PT), OT (By licensed OT)     PT Frequency: 5x min weekly OT Frequency: 5x min weekly            Contractures Contractures Info: Not present    Additional Factors Info  Code Status, Allergies Code Status Info: FULL Allergies Info: Sildenafil           Current Medications (08/15/2023):  This is the current hospital active medication list Current Facility-Administered Medications  Medication Dose Route Frequency Provider Last Rate Last Admin   acetaminophen (TYLENOL) tablet 650 mg  650 mg Oral Q6H PRN Amin, Ankit C, MD   650 mg at 08/13/23 1123   enoxaparin (LOVENOX) injection 40 mg  40 mg Subcutaneous Q24H Dolly Rias, MD   40 mg at 08/14/23 1956   erythromycin ophthalmic ointment   Both Eyes Q8H Amin, Ankit C, MD   Given at 08/15/23 0557   folic acid (FOLVITE) tablet 1  mg  1 mg Oral Daily Segars, Christiane Ha, MD   1 mg at 08/15/23 0851   guaiFENesin (ROBITUSSIN) 100 MG/5ML liquid 5 mL  5 mL Oral Q4H PRN Amin, Ankit C, MD       hydrALAZINE (APRESOLINE) injection 10 mg  10 mg Intravenous Q4H PRN Amin, Ankit C, MD       ipratropium-albuterol (DUONEB) 0.5-2.5 (3) MG/3ML nebulizer solution 3 mL  3 mL Nebulization Q4H PRN Amin, Ankit C, MD       levothyroxine (SYNTHROID) tablet 150 mcg  150 mcg Oral [id number]  Dolly Rias, MD   150 mcg at 08/15/23 0557   metoprolol tartrate (LOPRESSOR) injection 5 mg  5 mg Intravenous Q4H PRN Amin, Ankit C, MD       multivitamin with minerals tablet 1 tablet  1 tablet Oral Daily Segars, Christiane Ha, MD   1 tablet at 08/15/23 0851   ondansetron (ZOFRAN) injection 4 mg  4 mg Intravenous Q6H PRN Amin, Ankit C, MD       senna-docusate (Senokot-S) tablet 1 tablet  1 tablet Oral QHS PRN Amin, Ankit C, MD       sodium chloride flush (NS) 0.9 % injection 3 mL  3 mL Intravenous Q12H Dolly Rias, MD   3 mL at 08/15/23 0851   tamsulosin (FLOMAX) capsule 0.4 mg  0.4 mg Oral Daily Dolly Rias, MD   0.4 mg at 08/15/23 0851   thiamine (VITAMIN B1) tablet 100 mg  100 mg Oral Daily Dolly Rias, MD   100 mg at 08/15/23 0851   traZODone (DESYREL) tablet 50 mg  50 mg Oral QHS PRN Miguel Rota, MD         Discharge Medications: Please see discharge summary for a list of discharge medications.  Relevant Imaging Results:  Relevant Lab Results:   Additional Information [id number]  Delilah Shan, LCSWA

## 2023-08-15 NOTE — Progress Notes (Signed)
PROGRESS NOTE    [name] [name]  JXB:[id number] DOB: 1931/12/09 DOA: 08/12/2023 PCP: Knox Royalty, MD     Brief Narrative:  89 year old with history of A-fib on Eliquis, DM 2, HTN, HLD, hypothyroidism, CHF with preserved EF, prostate cancer, prior DVT comes to the ED after a wellness check and he was found on the ground at home.  Patient does not recall any events leading up to this.  Thinks he may have fallen 2 days prior to coming to the hospital.  Rhabdomyoma with IV fluids.     Assessment & Plan:  Principal Problem:   Ground-level fall    Ground-level fall, unknown downtime Unknown downtime.  CT head and cervical spine are negative.  UA negative.  TSH, ammonia, B12 are normal. PT/OT recommending SNF.   Mild cognitive impairment Possibly underlying dementia which has been progressing.  Trauma workup negative.  No focal neurodeficits.  B12 was normal.   Rhabdomyolysis; IMPROVED CK 5591, continuing to improve with IV fluids   Lactic acidosis -resolved   Elevated troponin EKG with a flutter, no acute ischemic change.  Initial troponin 92, remains flat and trending down.   Hyperbilirubinemia  Transaminitis, improving Elevated AST compared to ALT and total bilirubin.  Wonder if he has history of alcohol abuse?Marland Kitchen  Will trend LFTs, acute hepatitis panel. 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  Admission creatinine 1.4 now close to baseline of 1.0   Erythrocytosis, presumed 2/2 hemoconcentration  Resolved   A- flutter:  Not on any AV nodal blocker.  Continue Eliquis   HFpEF:  Currently hypovolemic, not on diuretics at home.  Currently appears dehydrated therefore fluids   Hypertension:  Hold home lisinopril.  IV as needed   Hypothyroidism:  Continues home levothyroxine.  Check TSH   HLD:  Does not appear still taking statin   History of diabetes:  A1c 5.5.  Sliding scale and Accu-Cheks   History of  prostate cancer, radiation seeds   Prior DVT: On Eliquis   PT/OT= snf     DVT prophylaxis: Lovenox Code Status: Full code Family Communication: Nephew updated periodically Status is: Inpatient Remains inpatient appropriate because: Cont hosp stay, need safe dispo         Subjective:  Sitting up in the chair. Feeling ok Eating well  Examination:  General exam: Appears calm and comfortable  Respiratory system: Clear to auscultation. Respiratory effort normal. Cardiovascular system: S1 & S2 heard, RRR. No JVD, murmurs, rubs, gallops or clicks. No pedal edema. Gastrointestinal system: Abdomen is nondistended, soft and nontender. No organomegaly or masses felt. Normal bowel sounds heard. Central nervous system: Alert and oriented. No focal neurological deficits. Extremities: Symmetric 5 x 5 power. Skin: No rashes, lesions or ulcers Psychiatry: Judgement and insight appear Poor      Diet Orders (From admission, onward)     Start     Ordered   08/12/23 2039  Diet Heart Room service appropriate? Yes; Fluid consistency: Thin  Diet effective now       Question Answer Comment  Room service appropriate? Yes   Fluid consistency: Thin      08/12/23 2046            Objective: Vitals:   08/13/23 2300 08/14/23 1625 08/15/23 0435 08/15/23 0956  BP: (!) 150/82 128/85 (!) 150/96 (!) 119/106  Pulse: 65 69 67 65  Resp: 18 18 20 18   Temp: (!) 97.5 F (36.4  C) (!) 97.5 F (36.4 C) 98.4 F (36.9 C) 98 F (36.7 C)  TempSrc: Oral Oral Oral Oral  SpO2: 100% 100% 100% 100%  Weight:      Height:        Intake/Output Summary (Last 24 hours) at 08/15/2023 1126 Last data filed at 08/15/2023 0434 Gross per 24 hour  Intake --  Output 2500 ml  Net -2500 ml   Filed Weights   08/12/23 1423  Weight: 95.3 kg    Scheduled Meds:  enoxaparin (LOVENOX) injection  40 mg Subcutaneous Q24H   erythromycin   Both Eyes Q8H   folic acid  1 mg Oral Daily   levothyroxine  150 mcg  Oral Q0600   multivitamin with minerals  1 tablet Oral Daily   sodium chloride flush  3 mL Intravenous Q12H   tamsulosin  0.4 mg Oral Daily   thiamine  100 mg Oral Daily   Continuous Infusions:  Nutritional status     Body mass index is 25.56 kg/m.  Data Reviewed:   CBC: Recent Labs  Lab 08/12/23 1426 08/13/23 0336 08/14/23 0532 08/15/23 0355  WBC 6.1 5.8 4.2 4.0  HGB 18.7* 17.6* 16.7 17.4*  HCT 53.4* 49.7 48.2 49.0  MCV 88.0 88.1 87.5 86.9  PLT 166 146* 157 179   Basic Metabolic Panel: Recent Labs  Lab 08/12/23 1426 08/13/23 0335 08/14/23 0532 08/15/23 0355  NA 136 138 137 137  K 4.7 4.2 4.6 4.5  CL 98 104 105 105  CO2 19* 21* 22 21*  GLUCOSE 95 105* 99 102*  BUN 26* 26* 19 15  CREATININE 1.41* 1.22 1.02 0.95  CALCIUM 9.5 8.9 9.1 9.3  MG  --  2.2 1.9 1.9  PHOS  --  3.2  --   --    GFR: Estimated Creatinine Clearance: 66 mL/min (by C-G formula based on SCr of 0.95 mg/dL). Liver Function Tests: Recent Labs  Lab 08/12/23 1426 08/13/23 0335 08/14/23 0532 08/15/23 0355  AST 185* 134* 97* 79*  ALT 63* 54* 50* 49*  ALKPHOS 64 55 53 50  BILITOT 3.7* 2.6* 1.8* 1.8*  PROT 7.4 6.5 6.0* 6.3*  ALBUMIN 3.9 3.3* 3.1* 3.2*   No results for input(s): "LIPASE", "AMYLASE" in the last 168 hours. Recent Labs  Lab 08/12/23 1549  AMMONIA 15   Coagulation Profile: No results for input(s): "INR", "PROTIME" in the last 168 hours. Cardiac Enzymes: Recent Labs  Lab 08/12/23 1426 08/13/23 0335 08/14/23 0532 08/15/23 0355  CKTOTAL 5,591* 3,423* 1,464* 950*   BNP (last 3 results) No results for input(s): "PROBNP" in the last 8760 hours. HbA1C: Recent Labs    08/13/23 0336  HGBA1C 5.5   CBG: Recent Labs  Lab 08/12/23 1437 08/12/23 2323  GLUCAP 100* 93   Lipid Profile: No results for input(s): "CHOL", "HDL", "LDLCALC", "TRIG", "CHOLHDL", "LDLDIRECT" in the last 72 hours. Thyroid Function Tests: Recent Labs    08/13/23 0336  TSH 1.065   Anemia  Panel: Recent Labs    08/13/23 0336  VITAMINB12 844   Sepsis Labs: Recent Labs  Lab 08/12/23 1450 08/12/23 1745  LATICACIDVEN 2.5* 1.8    No results found for this or any previous visit (from the past 240 hour(s)).       Radiology Studies: No results found.         LOS: 3 days   Time spent= 35 mins    Miguel Rota, MD [facility]  If 7PM-7AM, please contact night-coverage  08/15/2023,  11:26 AM

## 2023-08-15 NOTE — TOC Initial Note (Addendum)
Transition of Care [facility]) - Initial/Assessment Note    Patient Details  Name: [name] [name] MRN: [id number] Date of Birth: 25-Feb-1932  Transition of Care [facility]) CM/SW Contact:    Delilah Shan, LCSWA Phone Number: 08/15/2023, 1:06 PM  Clinical Narrative:                   CSW received consult for possible SNF placement at time of discharge. CSW spoke with patient regarding PT recommendation of SNF placement at time of discharge.  Patient expressed understanding of PT recommendation and is agreeable to SNF placement at time of discharge. Patient reports PTA he comes from home with spouse. Patient request for CSW to call his nephew Greggory Stallion to help assist with his dc plan. CSW spoke with patients nephew Greggory Stallion who is agreeable with SNF placement for patient at time of discharge. Patients nephew agreeable for CSW to fax out for SNF placement near the [place] area.CSW discussed insurance authorization process. No further questions reported at this time. CSW started insurance authorization for patient Berkley Harvey ID# [id number]. CSW will add patients facility choice when determined.CSW to continue to follow and assist with discharge planning needs.   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) Lives with:: Spouse Patient language and need for interpreter reviewed:: Yes Do you feel safe going back to the place where you live?: No    SNF  Need for Family Participation in Patient Care: Yes (Comment) Care giver support system in place?: Yes (comment)   Criminal Activity/Legal Involvement Pertinent to Current Situation/Hospitalization: No - Comment as needed  Activities of Daily Living   ADL Screening (condition at time of admission) Independently performs ADLs?: Yes (appropriate for developmental age) Is the patient deaf or have difficulty hearing?: No Does the patient have difficulty seeing, even when wearing glasses/contacts?: No Does the patient have difficulty concentrating, remembering, or making decisions?: Yes  Permission Sought/Granted Permission sought to share information with : Case Manager, Family Supports, Oceanographer granted to share information with : Yes, Verbal Permission Granted  Share Information with NAME: Greggory Stallion  Permission granted to share info w AGENCY: SNF  Permission granted to share info w Relationship: nephew  Permission granted to share info w Contact Information: Greggory Stallion [phone]  Emotional Assessment Appearance:: Appears stated age Attitude/Demeanor/Rapport: Gracious Affect (typically observed): Calm Orientation: : Oriented to Self, Oriented to Place, Oriented to  Time, Oriented to Situation (WDL) Alcohol / Substance Use: Not Applicable Psych Involvement: No (comment)  Admission diagnosis:  Dehydration [E86.0] Fall, initial encounter [W19.XXXA] Non-traumatic rhabdomyolysis [M62.82] Ground-level fall [W18.30XA] Patient Active Problem List   Diagnosis Date Noted   Ground-level fall 08/12/2023   Falls frequently 11/17/2022   Atrial fibrillation (HCC) 11/17/2022   COVID-19 virus infection 11/17/2022   B12 deficiency 03/10/2021  DIZZINESS 08/28/2009   SHOULDER PAIN, LEFT 08/25/2009   FATIGUE 08/25/2009   Type 2 diabetes mellitus without complication, without long-term current use of insulin (HCC) 03/05/2008   HYPOGONADISM, MALE 03/05/2008    CONDUCTIVE HEARING LOSS BILATERAL 03/05/2008   Allergic rhinitis 03/05/2008   NEOPLASM, MALIGNANT, PROSTATE 09/05/2007   Hypothyroidism 09/05/2007   HYPERLIPIDEMIA 09/05/2007   ANEMIA-IRON DEFICIENCY 09/05/2007   Anxiety state 09/05/2007   ERECTILE DYSFUNCTION 09/05/2007   DEPRESSION 09/05/2007   Migraine without aura 09/05/2007   Essential hypertension 09/05/2007   BRADYCARDIA, CHRONIC 09/05/2007   GERD 09/05/2007   SWELLING MASS OR LUMP IN HEAD AND NECK 09/05/2007   GLYCOSURIA 09/05/2007   DECREASED LIBIDO 09/05/2007   GLUCOSE INTOLERANCE, HX OF 09/05/2007   History of cardiovascular disorder 09/05/2007   VOCAL CORD POLYP, HX OF 09/05/2007   PCP:  Knox Royalty, MD Pharmacy:   [id number] - [place], [place] - [address] [place] [address] Phone: [phone] Fax: [phone]     Social Determinants of Health (SDOH) Social History: SDOH Screenings   Food Insecurity: Unknown (08/13/2023)  Housing: Patient Declined (08/13/2023)  Transportation Needs: No Transportation Needs (08/13/2023)  Utilities: Not At Risk (08/13/2023)  Tobacco Use: Medium Risk (08/12/2023)   SDOH Interventions:     Readmission Risk Interventions     No data to display

## 2023-08-15 NOTE — Progress Notes (Signed)
   08/15/23 1031  Mobility  Activity Ambulated with assistance in hallway  Level of Assistance Contact guard assist, steadying assist  Assistive Device Front wheel walker  Distance Ambulated (ft) 100 ft  Activity Response Tolerated fair  Mobility Referral Yes  $Mobility charge 1 Mobility  Mobility Specialist Start Time (ACUTE ONLY) 0945  Mobility Specialist Stop Time (ACUTE ONLY) 1020  Mobility Specialist Time Calculation (min) (ACUTE ONLY) 35 min   Mobility Specialist: Progress Note  Pre-Mobility: HR 65, BP 119/106 (112) - taken by NT during session Post-Mobility: HR 65, BP 148/96 (113)  Pt agreeable to mobility session - received in Bed. Required CG throughout using RW. C/o lightheadedness, soreness from fall, and L side chest pain rated 06/1009 . Returned to chair with all needs met - call bell within reach. Chair alarm on.   Barnie Mort, BS Mobility Specialist Please contact via SecureChat or Rehab office at [phone].

## 2023-08-15 NOTE — Progress Notes (Signed)
   08/15/23 1200  Mobility  Activity Transferred from chair to bed  Level of Assistance Contact guard assist, steadying assist  Assistive Device Front wheel walker  Distance Ambulated (ft) 5 ft  Activity Response Tolerated fair  Mobility Referral Yes  $Mobility charge 1 Mobility  Mobility Specialist Start Time (ACUTE ONLY) 1151  Mobility Specialist Stop Time (ACUTE ONLY) 1200  Mobility Specialist Time Calculation (min) (ACUTE ONLY) 9 min   Mobility Specialist: Progress Note  Pt agreeable to mobility session - received in chair. Required CG throughout using RW. C/o dry eyes and pain on L side when lying back in bed. Returned to bed with all needs met - call bell within reach. Bed alarm on.   Barnie Mort, BS Mobility Specialist Please contact via SecureChat or Rehab office at [phone].

## 2023-08-15 NOTE — Care Management Important Message (Signed)
Important Message  Patient Details  Name: [name] [name] MRN: [id number] Date of Birth: [birth year]   Important Message Given:  Yes - Medicare IM     Sherilyn Banker 08/15/2023, 2:04 PM

## 2023-08-15 NOTE — Plan of Care (Signed)

## 2023-08-15 NOTE — Plan of Care (Signed)
  Problem: Pain Managment: Goal: General experience of comfort will improve Outcome: Progressing   Problem: Safety: Goal: Ability to remain free from injury will improve Outcome: Progressing   

## 2023-08-15 NOTE — Progress Notes (Signed)
   08/15/23 1445  Mobility  Activity Ambulated with assistance to bathroom  Level of Assistance Contact guard assist, steadying assist  Assistive Device Front wheel walker  Distance Ambulated (ft) 20 ft  Activity Response Tolerated fair  Mobility Referral Yes  $Mobility charge 1 Mobility  Mobility Specialist Start Time (ACUTE ONLY) 0225  Mobility Specialist Stop Time (ACUTE ONLY) 0245  Mobility Specialist Time Calculation (min) (ACUTE ONLY) 20 min   Mobility Specialist: Progress Note  Pt requested assistance to the BR - received in bed. Required CG  using RW. C/o generalized weakness. Returned to bed with all needs met - call bell within reach. Bed alarm on.   Barnie Mort, BS Mobility Specialist Please contact via SecureChat or Rehab office at [phone].

## 2023-08-16 DIAGNOSIS — W1830XA Fall on same level, unspecified, initial encounter: Secondary | ICD-10-CM | POA: Diagnosis not present

## 2023-08-16 DIAGNOSIS — E785 Hyperlipidemia, unspecified: Secondary | ICD-10-CM | POA: Diagnosis not present

## 2023-08-16 DIAGNOSIS — E039 Hypothyroidism, unspecified: Secondary | ICD-10-CM | POA: Diagnosis not present

## 2023-08-16 LAB — CBC
HCT: 47.6 % (ref 39.0–52.0)
Hemoglobin: 16.6 g/dL (ref 13.0–17.0)
MCH: 29.9 pg (ref 26.0–34.0)
MCHC: 34.9 g/dL (ref 30.0–36.0)
MCV: 85.6 fL (ref 80.0–100.0)
Platelets: 192 10*3/uL (ref 150–400)
RBC: 5.56 MIL/uL (ref 4.22–5.81)
RDW: 12.7 % (ref 11.5–15.5)
WBC: 3.7 10*3/uL — ABNORMAL LOW (ref 4.0–10.5)
nRBC: 0 % (ref 0.0–0.2)

## 2023-08-16 LAB — COMPREHENSIVE METABOLIC PANEL
ALT: 43 U/L (ref 0–44)
AST: 54 U/L — ABNORMAL HIGH (ref 15–41)
Albumin: 3.1 g/dL — ABNORMAL LOW (ref 3.5–5.0)
Alkaline Phosphatase: 54 U/L (ref 38–126)
Anion gap: 10 (ref 5–15)
BUN: 20 mg/dL (ref 8–23)
CO2: 24 mmol/L (ref 22–32)
Calcium: 9.2 mg/dL (ref 8.9–10.3)
Chloride: 103 mmol/L (ref 98–111)
Creatinine, Ser: 1.32 mg/dL — ABNORMAL HIGH (ref 0.61–1.24)
GFR, Estimated: 52 mL/min — ABNORMAL LOW (ref >60)
Glucose, Bld: 92 mg/dL (ref 70–99)
Potassium: 4.1 mmol/L (ref 3.5–5.1)
Sodium: 137 mmol/L (ref 135–145)
Total Bilirubin: 1.1 mg/dL (ref 0.3–1.2)
Total Protein: 6.3 g/dL — ABNORMAL LOW (ref 6.5–8.1)

## 2023-08-16 LAB — MAGNESIUM: Magnesium: 1.8 mg/dL (ref 1.7–2.4)

## 2023-08-16 LAB — CK: Total CK: 440 U/L — ABNORMAL HIGH (ref 49–397)

## 2023-08-16 NOTE — TOC Progression Note (Addendum)
Transition of Care [facility]) - Progression Note    Patient Details  Name: [name] [name] MRN: [id number] Date of Birth: 1932/09/16  [facility] CM/SW Contact  Carley Hammed, LCSW Phone Number: 08/16/2023, 10:10 AM  Clinical Narrative:    CSW spoke with pt's nephew Greggory Stallion to provide bed offers at pt's request. Nephew asks for a return call at 12 to discuss bed offers. Pt's Berkley Harvey is pending, but navi portal is down, unable to get insurance authorizations at this time. TOC will continue to follow for DC needs.   12:50 CSW spoke with Greggory Stallion, pt's nephew and provided bed offers and Medicare rating information. Greggory Stallion chose [facility]. CSW and LCSWA met with pt at bedside to confirm bed choice. CSW reviewed bed options with pt and provided a Medicare.gov list. Pt reviewed and stated that he would go with [facility] choice. CSW unable to retrieve authorization at this time due to [place] being down. TOC will continue to follow.   1:15 CSW received a call from [place] at [facility] [phone]) who stated that she had pt's spouse and had spoken with nephew, Greggory Stallion, and he had provided CSW's phone number. Toni Amend stated that family would like for pt's to be placed together. Wadie Lessen stating they are unsure of beds. CSW and HP CSW reached out to [facility] and [facility] to see if they could be placed together. Waiting for facilities to review.   4:30 CSW spoke with several facilities on accepting pt and spouse. [facility] Manners is able to offer on both and will have a rep come to assess pt. Pt's nephew agreeable to change in venue. He requests CSW work with [facility] CSW and update him at once to limit phone calls. Nephew states he is working on getting pt's son involved as he has his own health issues and wants son to assist as well. TOC will continue to follow.  Expected Discharge Plan: Skilled Nursing Facility Barriers to Discharge: Continued Medical Work up  Expected Discharge Plan and Services In-house  Referral: Clinical Social Work     Living arrangements for the past 2 months: Apartment (lives with spouse) Expected Discharge Date: 08/16/23                                     Social Determinants of Health (SDOH) Interventions SDOH Screenings   Food Insecurity: Unknown (08/13/2023)  Housing: Patient Declined (08/13/2023)  Transportation Needs: No Transportation Needs (08/13/2023)  Utilities: Not At Risk (08/13/2023)  Tobacco Use: Medium Risk (08/12/2023)    Readmission Risk Interventions     No data to display

## 2023-08-16 NOTE — Progress Notes (Signed)
PROGRESS NOTE    [name] [name]  PIR:[id number] DOB: 1932-06-19 DOA: 08/12/2023 PCP: Knox Royalty, MD     Brief Narrative:  89 year old with history of A-fib on Eliquis, DM 2, HTN, HLD, hypothyroidism, CHF with preserved EF, prostate cancer, prior DVT comes to the ED after a wellness check and he was found on the ground at home.  Patient does not recall any events leading up to this.  Thinks he may have fallen 2 days prior to coming to the hospital.  Rhabdomyolysis improved with IV fluids.  PT recommended SNF therefore TOC working on arrangements.     Assessment & Plan:  Principal Problem:   Ground-level fall    Ground-level fall, unknown downtime Unknown downtime.  CT head and cervical spine are negative.  UA negative.  TSH, ammonia, B12 are normal. PT/OT recommending SNF.   Mild cognitive impairment Possibly underlying dementia which has been progressing.  Trauma workup negative.  No focal neurodeficits.  B12 was normal.   Rhabdomyolysis; IMPROVED CK 5591, continuing to improve with IV fluids   Lactic acidosis -resolved   Elevated troponin EKG with a flutter, no acute ischemic change.  Initial troponin 92, remains flat and trending down.   Hyperbilirubinemia  Transaminitis, improving Elevated AST compared to ALT and total bilirubin.  Wonder if he has history of alcohol abuse?Marland Kitchen  Will trend LFTs, acute hepatitis panel. 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  Admission creatinine 1.4 now close to baseline of 1.0   Erythrocytosis, presumed 2/2 hemoconcentration  Resolved   A- flutter:  Not on any AV nodal blocker.  No longer on Eliquis   HFpEF:  Currently hypovolemic, not on diuretics at home.  Currently appears dehydrated therefore fluids   Hypertension:  Hold home lisinopril.  IV as needed   Hypothyroidism:  Continues home levothyroxine.    HLD:  Does not appear still taking statin   History of  diabetes:  A1c 5.5.  Sliding scale and Accu-Cheks   History of prostate cancer, radiation seeds   Prior DVT: On Eliquis   PT/OT= snf     DVT prophylaxis: Lovenox Code Status: Full code Family Communication: Nephew updated periodically Status is: Inpatient snf placement               Subjective: Patient seen and examined at bedside.  No complaints but while working with physical therapy was quite unsteady   Examination:  General exam: Appears calm and comfortable, quite forgetful Respiratory system: Clear to auscultation. Respiratory effort normal. Cardiovascular system: S1 & S2 heard, RRR. No JVD, murmurs, rubs, gallops or clicks. No pedal edema. Gastrointestinal system: Abdomen is nondistended, soft and nontender. No organomegaly or masses felt. Normal bowel sounds heard. Central nervous system: Alert and oriented. No focal neurological deficits. Extremities: Symmetric 5 x 5 power. Skin: No rashes, lesions or ulcers Psychiatry: Judgement and insight appear normal.  Very forgetful.  Mood & affect appropriate.       Diet Orders (From admission, onward)     Start     Ordered   08/12/23 2039  Diet Heart Room service appropriate? Yes; Fluid consistency: Thin  Diet effective now       Question Answer Comment  Room service appropriate? Yes   Fluid consistency: Thin      08/12/23 2046            Objective: Vitals:   08/15/23 1454 08/15/23 2019 08/16/23 0436 08/16/23 6301  BP: 93/71 110/70 124/81 (!) 140/81  Pulse: 71 69 69 66  Resp: 18 18 16 18   Temp: (!) 97.5 F (36.4 C) 98.5 F (36.9 C) 98.1 F (36.7 C) (!) 97.5 F (36.4 C)  TempSrc: Oral Oral Oral Oral  SpO2: 100% 98% 96% 100%  Weight:      Height:        Intake/Output Summary (Last 24 hours) at 08/16/2023 1302 Last data filed at 08/16/2023 0800 Gross per 24 hour  Intake 880 ml  Output 700 ml  Net 180 ml   Filed Weights   08/12/23 1423  Weight: 95.3 kg    Scheduled Meds:   enoxaparin (LOVENOX) injection  40 mg Subcutaneous Q24H   erythromycin   Both Eyes Q8H   folic acid  1 mg Oral Daily   levothyroxine  150 mcg Oral Q0600   multivitamin with minerals  1 tablet Oral Daily   sodium chloride flush  3 mL Intravenous Q12H   tamsulosin  0.4 mg Oral Daily   thiamine  100 mg Oral Daily   Continuous Infusions:  Nutritional status     Body mass index is 25.56 kg/m.  Data Reviewed:   CBC: Recent Labs  Lab 08/12/23 1426 08/13/23 0336 08/14/23 0532 08/15/23 0355 08/16/23 0636  WBC 6.1 5.8 4.2 4.0 3.7*  HGB 18.7* 17.6* 16.7 17.4* 16.6  HCT 53.4* 49.7 48.2 49.0 47.6  MCV 88.0 88.1 87.5 86.9 85.6  PLT 166 146* 157 179 192   Basic Metabolic Panel: Recent Labs  Lab 08/12/23 1426 08/13/23 0335 08/14/23 0532 08/15/23 0355 08/16/23 0636  NA 136 138 137 137 137  K 4.7 4.2 4.6 4.5 4.1  CL 98 104 105 105 103  CO2 19* 21* 22 21* 24  GLUCOSE 95 105* 99 102* 92  BUN 26* 26* 19 15 20   CREATININE 1.41* 1.22 1.02 0.95 1.32*  CALCIUM 9.5 8.9 9.1 9.3 9.2  MG  --  2.2 1.9 1.9 1.8  PHOS  --  3.2  --   --   --    GFR: Estimated Creatinine Clearance: 47.5 mL/min (A) (by C-G formula based on SCr of 1.32 mg/dL (H)). Liver Function Tests: Recent Labs  Lab 08/12/23 1426 08/13/23 0335 08/14/23 0532 08/15/23 0355 08/16/23 0636  AST 185* 134* 97* 79* 54*  ALT 63* 54* 50* 49* 43  ALKPHOS 64 55 53 50 54  BILITOT 3.7* 2.6* 1.8* 1.8* 1.1  PROT 7.4 6.5 6.0* 6.3* 6.3*  ALBUMIN 3.9 3.3* 3.1* 3.2* 3.1*   No results for input(s): "LIPASE", "AMYLASE" in the last 168 hours. Recent Labs  Lab 08/12/23 1549  AMMONIA 15   Coagulation Profile: No results for input(s): "INR", "PROTIME" in the last 168 hours. Cardiac Enzymes: Recent Labs  Lab 08/12/23 1426 08/13/23 0335 08/14/23 0532 08/15/23 0355 08/16/23 0636  CKTOTAL 5,591* 3,423* 1,464* 950* 440*   BNP (last 3 results) No results for input(s): "PROBNP" in the last 8760 hours. HbA1C: No results for  input(s): "HGBA1C" in the last 72 hours. CBG: Recent Labs  Lab 08/12/23 1437 08/12/23 2323  GLUCAP 100* 93   Lipid Profile: No results for input(s): "CHOL", "HDL", "LDLCALC", "TRIG", "CHOLHDL", "LDLDIRECT" in the last 72 hours. Thyroid Function Tests: No results for input(s): "TSH", "T4TOTAL", "FREET4", "T3FREE", "THYROIDAB" in the last 72 hours. Anemia Panel: No results for input(s): "VITAMINB12", "FOLATE", "FERRITIN", "TIBC", "IRON", "RETICCTPCT" in the last 72 hours. Sepsis Labs: Recent Labs  Lab 08/12/23 1450 08/12/23 1745  LATICACIDVEN 2.5* 1.8  No results found for this or any previous visit (from the past 240 hour(s)).       Radiology Studies: No results found.         LOS: 4 days   Time spent= 35 mins    Miguel Rota, MD [facility]  If 7PM-7AM, please contact night-coverage  08/16/2023, 1:02 PM

## 2023-08-16 NOTE — Plan of Care (Signed)
Pt awaiting bed offers.

## 2023-08-16 NOTE — Progress Notes (Addendum)
Physical Therapy Treatment Patient Details Name: [name] [name] MRN: [id number] DOB: 04/26/1932 Today's Date: 08/16/2023   History of Present Illness The pt is an 89 yo male presenting 10/11 after he was found down for an unknown period of time during a wellness check. Pt found to have no acute bony injury, but does have acute encephalopathy on possible underlying neurocognitive disorder/dementia, dehydration, and rhabdomyolysis. PMH includes: frequent falls, HTN, A flutter prostate cancer, and hip surgery.    PT Comments  Pt with likely multifactorial problem challenging his balance and precipitating falls. Pt requires min A overall for bed mobility, transfers and ambulation with RW. However, experiences LoB with eyes closed while washing his face, dizziness and feeling like he is going to pass out with walking in the room, L nystagmus with finger follow and dizziness with head turns. Pt has A flutter at baseline and was removed from tele just prior to therapy so HR with mobilization not known. After reporting he felt like he was passing out pt sat EoB for vitals (see below). SpO2 95-100% throughout session. Will have PT who specializes in vestibular problems follow back with pt tomorrow. D/c plans remain appropriate when pt is medically stable.   Orthostatic BPs                                                          BP                      HR (bpm) Sitting after ambulation and feeling faint 138/112(121) 138  Sitting after 3 min 138/93(99) 85  Sitting after 5 min 117/79(91) 63  Sitting after transfer to chair from bed  112/79(90) 65      If plan is discharge home, recommend the following: A little help with walking and/or transfers;A little help with bathing/dressing/bathroom;Assistance with cooking/housework;Direct supervision/assist for medications management;Direct supervision/assist for financial management;Assist for transportation;Help with stairs or ramp for entrance;Supervision due to  cognitive status   Can travel by private vehicle     Yes  Equipment Recommendations  None recommended by PT    Recommendations for Other Services  Vestibular Exam      Precautions / Restrictions Precautions Precautions: Fall Precaution Comments: pt reports falling 2x/month, admitted after being found down Restrictions Weight Bearing Restrictions: No     Mobility  Bed Mobility Overal bed mobility: Needs Assistance Bed Mobility: Supine to Sit     Supine to sit: Min assist, Used rails     General bed mobility comments: use of rails and pt pulling on PT to elevate trunk    Transfers Overall transfer level: Needs assistance Equipment used: Rolling walker (2 wheels) Transfers: Sit to/from Stand, Bed to chair/wheelchair/BSC Sit to Stand: Min assist   Step pivot transfers: Min assist       General transfer comment: cuing for hand placement for power up, min A for steadying with coming to standing and stepping from bed to recliner    Ambulation/Gait Ambulation/Gait assistance: Contact guard assist, Min assist Gait Distance (Feet): 12 Feet (x2) Assistive device: Rolling walker (2 wheels) Gait Pattern/deviations: Step-through pattern, Shuffle, Trunk flexed, Narrow base of support Gait velocity: decreased Gait velocity interpretation: <1.31 ft/sec, indicative of household ambulator   General Gait Details: pt with narrow BoS, vc for proximity to  RW. and upright posture         Balance Overall balance assessment: Needs assistance Sitting-balance support: No upper extremity supported, Feet supported Sitting balance-Leahy Scale: Fair     Standing balance support: Bilateral upper extremity supported, During functional activity Standing balance-Leahy Scale: Poor Standing balance comment: Needs UE support for balance                            Cognition Arousal: Alert Behavior During Therapy: WFL for tasks assessed/performed Overall Cognitive Status: No  family/caregiver present to determine baseline cognitive functioning                                 General Comments: Knows he is in the hospital unable to name           General Comments General comments (skin integrity, edema, etc.): pt with LoB when he closes his eyes to wash his face at the sink, then while demonstrating the increased stability standing within the RW pt reporting he feels dizzy and that he is going to pass out, in addition to checking BP also checked vestibular function, pt with L 2 beat nystagmus with finger follow, and dizziness with head turns (will have Vestibular exam completed by next PT)      Pertinent Vitals/Pain Pain Assessment Pain Assessment: Faces Faces Pain Scale: Hurts little more Pain Location: R ribs/side of chest, stomachache Pain Descriptors / Indicators: Discomfort, Grimacing, Guarding Pain Intervention(s): Limited activity within patient's tolerance, Monitored during session, Repositioned     PT Goals (current goals can now be found in the care plan section) Acute Rehab PT Goals Patient Stated Goal: improve mobility, reduce falls PT Goal Formulation: With patient Time For Goal Achievement: 08/27/23 Potential to Achieve Goals: Good    Frequency    Min 1X/week       AM-PAC PT "6 Clicks" Mobility   Outcome Measure  Help needed turning from your back to your side while in a flat bed without using bedrails?: A Little Help needed moving from lying on your back to sitting on the side of a flat bed without using bedrails?: A Little Help needed moving to and from a bed to a chair (including a wheelchair)?: A Little Help needed standing up from a chair using your arms (e.g., wheelchair or bedside chair)?: A Little Help needed to walk in hospital room?: A Little Help needed climbing 3-5 steps with a railing? : A Lot 6 Click Score: 17    End of Session Equipment Utilized During Treatment: Gait belt Activity Tolerance:  Patient tolerated treatment well Patient left: in chair;with call bell/phone within reach;with chair alarm set Nurse Communication: Mobility status PT Visit Diagnosis: Unsteadiness on feet (R26.81);Repeated falls (R29.6);Muscle weakness (generalized) (M62.81);History of falling (Z91.81)     Time: 4259-5638 PT Time Calculation (min) (ACUTE ONLY): 36 min  Charges:    $Gait Training: 8-22 mins $Therapeutic Activity: 8-22 mins PT General Charges $$ ACUTE PT VISIT: 1 Visit                     Chester Sibert B. Beverely Risen PT, DPT Acute Rehabilitation Services Please use secure chat or  Call Office [phone]    Elon Alas Fleet 08/16/2023, 12:20 PM

## 2023-08-17 DIAGNOSIS — W1830XA Fall on same level, unspecified, initial encounter: Secondary | ICD-10-CM | POA: Diagnosis not present

## 2023-08-17 DIAGNOSIS — E039 Hypothyroidism, unspecified: Secondary | ICD-10-CM | POA: Diagnosis not present

## 2023-08-17 LAB — COMPREHENSIVE METABOLIC PANEL
ALT: 39 U/L (ref 0–44)
AST: 50 U/L — ABNORMAL HIGH (ref 15–41)
Albumin: 3 g/dL — ABNORMAL LOW (ref 3.5–5.0)
Alkaline Phosphatase: 50 U/L (ref 38–126)
Anion gap: 9 (ref 5–15)
BUN: 18 mg/dL (ref 8–23)
CO2: 24 mmol/L (ref 22–32)
Calcium: 9.3 mg/dL (ref 8.9–10.3)
Chloride: 103 mmol/L (ref 98–111)
Creatinine, Ser: 1.28 mg/dL — ABNORMAL HIGH (ref 0.61–1.24)
GFR, Estimated: 54 mL/min — ABNORMAL LOW (ref >60)
Glucose, Bld: 117 mg/dL — ABNORMAL HIGH (ref 70–99)
Potassium: 4.4 mmol/L (ref 3.5–5.1)
Sodium: 136 mmol/L (ref 135–145)
Total Bilirubin: 1.4 mg/dL — ABNORMAL HIGH (ref 0.3–1.2)
Total Protein: 6 g/dL — ABNORMAL LOW (ref 6.5–8.1)

## 2023-08-17 LAB — CBC
HCT: 46.1 % (ref 39.0–52.0)
Hemoglobin: 16.1 g/dL (ref 13.0–17.0)
MCH: 29.8 pg (ref 26.0–34.0)
MCHC: 34.9 g/dL (ref 30.0–36.0)
MCV: 85.4 fL (ref 80.0–100.0)
Platelets: 213 10*3/uL (ref 150–400)
RBC: 5.4 MIL/uL (ref 4.22–5.81)
RDW: 12.9 % (ref 11.5–15.5)
WBC: 4.5 10*3/uL (ref 4.0–10.5)
nRBC: 0 % (ref 0.0–0.2)

## 2023-08-17 LAB — MAGNESIUM: Magnesium: 1.9 mg/dL (ref 1.7–2.4)

## 2023-08-17 NOTE — Plan of Care (Signed)
  Problem: Education: Goal: Knowledge of risk factors and measures for prevention of condition will improve Outcome: Progressing   Problem: Respiratory: Goal: Will maintain a patent airway Outcome: Progressing Goal: Complications related to the disease process, condition or treatment will be avoided or minimized Outcome: Progressing   Problem: Clinical Measurements: Goal: Ability to maintain clinical measurements within normal limits will improve Outcome: Progressing Goal: Will remain free from infection Outcome: Progressing   Problem: Pain Managment: Goal: General experience of comfort will improve Outcome: Progressing

## 2023-08-17 NOTE — TOC Transition Note (Addendum)
Transition of Care [facility]) - CM/SW Discharge Note   Patient Details  Name: [name] [name] MRN: [id number] Date of Birth: 1931-11-04  Transition of Care [facility]) CM/SW Contact:  Michaela Corner, LCSWA Phone Number: 08/17/2023, 11:20 AM   Clinical Narrative:   Pt going to [facility] via [facility]. Nurse call to report [phone]. Pt going to room 3218    Final next level of care: Skilled Nursing Facility [facility]) Barriers to Discharge: Barriers Resolved   Patient Goals and CMS Choice   Choice offered to / list presented to : Patient (patient and patinets nephew Greggory Stallion)  Discharge Placement                Patient chooses bed at: [facility] Patient to be transferred to facility by: PT[facility] Name of family member notified: Tana Coast Patient and family notified of of transfer: 08/17/23  Discharge Plan and Services Additional resources added to the After Visit Summary for   In-house Referral: Clinical Social Work                                   Social Determinants of Health (SDOH) Interventions SDOH Screenings   Food Insecurity: Unknown (08/13/2023)  Housing: Patient Declined (08/13/2023)  Transportation Needs: No Transportation Needs (08/13/2023)  Utilities: Not At Risk (08/13/2023)  Tobacco Use: Medium Risk (08/12/2023)     Readmission Risk Interventions     No data to display

## 2023-08-17 NOTE — Progress Notes (Signed)
Physical Therapy Treatment Patient Details Name: [name] [name] MRN: [id number] DOB: 1932-05-08 Today's Date: 08/17/2023   History of Present Illness The pt is an 89 yo male presenting 10/11 after he was found down for an unknown period of time during a wellness check. Pt found to have no acute bony injury, but does have acute encephalopathy on possible underlying neurocognitive disorder/dementia, dehydration, and rhabdomyolysis. PMH includes: frequent falls, HTN, A flutter prostate cancer, and hip surgery.    PT Comments  Patient received standing at sink in bathroom, reports he was told to call for assistance but thought he should wash his hands first. Reviewed safety precautions to call for assistance always when wanting to mobilize. Min assist/CGA for amb from bathroom back to EOB and cues for safe walker management. Once EOB vestibular assessment completed with no nystagmus noted at rest, intact head impulse, and impaired saccades/pursuits. BPPV testing negative as well. Pt denied change or onset of dizziness with any testing. EOS pt returned to supine in bed to rest and Alarm on and call bell within reach. Will continue to progress as able and assess vestibular system as/if symptoms persist.    If plan is discharge home, recommend the following: A little help with walking and/or transfers;A little help with bathing/dressing/bathroom;Assistance with cooking/housework;Direct supervision/assist for medications management;Direct supervision/assist for financial management;Assist for transportation;Help with stairs or ramp for entrance;Supervision due to cognitive status   Can travel by private vehicle     Yes  Equipment Recommendations  None recommended by PT    Recommendations for Other Services       Precautions / Restrictions Precautions Precautions: Fall Precaution Comments: pt reports falling 2x/month, admitted after being found down Restrictions Weight Bearing Restrictions: No       Vestibular Assessment - 08/17/23 0001       Oculomotor Exam   Oculomotor Alignment Normal    Ocular ROM WNL    Spontaneous Absent    Gaze-induced  Absent;Age appropriate nystagmus at end range    Head shaking Horizontal Absent   pt speed too slow likely   Head Shaking Vertical Absent   pt speed too slow likely   Smooth Pursuits Saccades    Saccades Poor trajectory   Rt>Lt     Oculomotor Exam-Fixation Suppressed    Left Head Impulse limited by ROM and resistnance to test    Right Head Impulse limited by ROM and resistnance to test      Positional Testing   Sidelying Test Sidelying Right;Sidelying Left      Sidelying Right   Sidelying Right Symptoms No nystagmus      Sidelying Left   Sidelying Left Symptoms No nystagmus             Mobility  Bed Mobility Overal bed mobility: Needs Assistance Bed Mobility: Sit to Supine       Sit to supine: Min assist, Used rails   General bed mobility comments: min assist to lower trunk and bring Le's into bed    Transfers Overall transfer level: Needs assistance Equipment used: Rolling walker (2 wheels) Transfers: Sit to/from Stand Sit to Stand: Min assist           General transfer comment: cue for reach back to control lower. pt performed partial sit<>stand for scooting lateral towards HOB prior to return to supine.    Ambulation/Gait Ambulation/Gait assistance: Contact guard assist, Min assist Gait Distance (Feet): 15 Feet Assistive device: Rolling walker (2 wheels) Gait Pattern/deviations: Step-through pattern, Shuffle, Trunk flexed,  Narrow base of support Gait velocity: decreased     General Gait Details: pt with narrow BoS, vc for proximity to RW. and upright posture. min assist to steady and guide RW turn to amb out of bathroom and sit back EOB.   Stairs             Wheelchair Mobility     Tilt Bed    Modified Rankin (Stroke Patients Only)       Balance Overall balance assessment: Needs  assistance Sitting-balance support: No upper extremity supported, Feet supported Sitting balance-Leahy Scale: Fair     Standing balance support: Bilateral upper extremity supported, During functional activity Standing balance-Leahy Scale: Poor Standing balance comment: Needs UE support for balance                            Cognition Arousal: Alert Behavior During Therapy: WFL for tasks assessed/performed Overall Cognitive Status: No family/caregiver present to determine baseline cognitive functioning                                 General Comments: Knows he is in the hospital unable to name        Exercises      General Comments        Pertinent Vitals/Pain Pain Assessment Faces Pain Scale: Hurts little more Pain Location: R ribs/side of chest, stomachache Pain Descriptors / Indicators: Discomfort, Grimacing, Guarding    Home Living                          Prior Function            PT Goals (current goals can now be found in the care plan section) Acute Rehab PT Goals Patient Stated Goal: improve mobility, reduce falls PT Goal Formulation: With patient Time For Goal Achievement: 08/27/23 Potential to Achieve Goals: Good Progress towards PT goals: Progressing toward goals    Frequency    Min 1X/week      PT Plan      Co-evaluation              AM-PAC PT "6 Clicks" Mobility   Outcome Measure  Help needed turning from your back to your side while in a flat bed without using bedrails?: A Little Help needed moving from lying on your back to sitting on the side of a flat bed without using bedrails?: A Little Help needed moving to and from a bed to a chair (including a wheelchair)?: A Little Help needed standing up from a chair using your arms (e.g., wheelchair or bedside chair)?: A Little Help needed to walk in hospital room?: A Little Help needed climbing 3-5 steps with a railing? : A Lot 6 Click Score: 17     End of Session Equipment Utilized During Treatment: Gait belt Activity Tolerance: Patient tolerated treatment well Patient left: in chair;with call bell/phone within reach;with chair alarm set Nurse Communication: Mobility status PT Visit Diagnosis: Unsteadiness on feet (R26.81);Repeated falls (R29.6);Muscle weakness (generalized) (M62.81);History of falling (Z91.81)     Time: 1610-9604 PT Time Calculation (min) (ACUTE ONLY): 23 min  Charges:    $Gait Training: 8-22 mins $Physical Performance Test: 8-22 mins PT General Charges $$ ACUTE PT VISIT: 1 Visit  Wynn Maudlin, DPT Acute Rehabilitation Services Office [phone]  08/17/23 4:55 PM

## 2023-08-17 NOTE — Progress Notes (Signed)
Pt has DC order. AVS was printed and kept in the chart together with other paperworks. Report was called in and given to [facility] of [place], all questions has been answered.  Pending PTAR.

## 2023-08-17 NOTE — Discharge Summary (Signed)
Physician Discharge Summary  [name] [name] AVW:[id number] DOB: [birth year] DOA: 08/12/2023  PCP: Knox Royalty, MD  Admit date: 08/12/2023 Discharge date: 08/17/2023  Admitted From: home Discharge disposition: SNF   Recommendations for Outpatient Follow-Up:   Cbc, CMP 1 week   Discharge Diagnosis:   Principal Problem:   Ground-level fall    Discharge Condition: Improved.  Diet recommendation: Low sodium, heart healthy.  Carbohydrate-modified.  Regular.  Wound care: None.  Code status: Full.   History of Present Illness:   89 year old with history of A-fib on Eliquis, DM 2, HTN, HLD, hypothyroidism, CHF with preserved EF, prostate cancer, prior DVT comes to the ED after a wellness check and he was found on the ground at home. Patient does not recall any events leading up to this. Thinks he may have fallen 2 days prior to coming to the hospital. Rhabdomyolysis improved with IV fluids. PT recommended SNF therefore TOC working on arrangements.    Hospital Course by Problem:   Ground-level fall, unknown downtime Unknown downtime.  CT head and cervical spine are negative.  UA negative.  TSH, ammonia, B12 are normal. PT/OT recommending SNF.   Mild cognitive impairment Possibly underlying dementia which has been progressing.  Trauma workup negative.  No focal neurodeficits.  B12 was normal. -outpatient follow up   Rhabdomyolysis; IMPROVED CK 5591-- trended down to 440   Lactic acidosis -resolved   Elevated troponin EKG with a flutter, no acute ischemic change.  Initial troponin 92, remains flat and trending down.   Hyperbilirubinemia  Transaminitis, improving Elevated AST compared to ALT and total bilirubin.  Wonder if he has history of alcohol abuse?Marland Kitchen  Will trend LFTs, acute hepatitis panel. CT scan shows history of cholecystectomy -outpatient follow up   Possible AKI stage I on CKD stage II-III:  -outpatient follow up   Erythrocytosis, presumed 2/2  hemoconcentration  Resolved   A- flutter:  Not on any AV nodal blocker.  No longer on Eliquis   HFpEF:  Currently hypovolemic, not on diuretics at home -euvolemic now   Hypertension:  -resume home meds   Hypothyroidism:  Continues home levothyroxine.    HLD:  Does not appear still taking statin   History of diabetes:  A1c 5.5.  Sliding scale and Accu-Cheks   History of prostate cancer, radiation seeds   Prior DVT: On Eliquis   PT/OT= snf      Medical Consultants:      Discharge Exam:   Vitals:   08/17/23 0428 08/17/23 0853  BP: 121/70 133/79  Pulse: 67 66  Resp: 18 17  Temp: 98.3 F (36.8 C) (!) 97.4 F (36.3 C)  SpO2: 100% 100%   Vitals:   08/16/23 1509 08/16/23 1957 08/17/23 0428 08/17/23 0853  BP: 132/76 (!) 141/71 121/70 133/79  Pulse: 70 68 67 66  Resp: 18 18 18 17   Temp: 97.7 F (36.5 C) 98.4 F (36.9 C) 98.3 F (36.8 C) (!) 97.4 F (36.3 C)  TempSrc: Oral Oral Oral Oral  SpO2: 94% 100% 100% 100%  Weight:      Height:        General exam: Appears calm and comfortable.  The results of significant diagnostics from this hospitalization (including imaging, microbiology, ancillary and laboratory) are listed below for reference.     Procedures and Diagnostic Studies:   CT CHEST ABDOMEN PELVIS W CONTRAST  Result Date: 08/12/2023 CLINICAL DATA:  Found down. Polytrauma, blunt. Altered mental status. Abdominal pain. EXAM: CT CHEST, ABDOMEN,  AND PELVIS WITH CONTRAST TECHNIQUE: Multidetector CT imaging of the chest, abdomen and pelvis was performed following the standard protocol during bolus administration of intravenous contrast. RADIATION DOSE REDUCTION: This exam was performed according to the departmental dose-optimization program which includes automated exposure control, adjustment of the mA and/or kV according to patient size and/or use of iterative reconstruction technique. CONTRAST:  75mL OMNIPAQUE IOHEXOL 350 MG/ML SOLN COMPARISON:  None  Available. FINDINGS: CT CHEST FINDINGS Cardiovascular: Heart is normal size. Aorta is normal caliber. Scattered coronary artery and aortic calcifications. Mediastinum/Nodes: No mediastinal, hilar, or axillary adenopathy. Trachea and esophagus are unremarkable. Thyroid unremarkable. Lungs/Pleura: Lungs are clear. No focal airspace opacities or suspicious nodules. No effusions. Musculoskeletal: Chest wall soft tissues are unremarkable. No acute bony abnormality. CT ABDOMEN PELVIS FINDINGS Hepatobiliary: Scattered hypodensities in the liver most compatible with cysts, the largest 2.5 cm in the left hepatic dome. No additional follow-up imaging recommended. Prior cholecystectomy. No biliary ductal dilatation. No perihepatic hematoma. Pancreas: No focal abnormality or ductal dilatation. Spleen: No splenic injury or perisplenic hematoma. Adrenals/Urinary Tract: No adrenal hemorrhage or renal injury identified. Bladder is unremarkable. Stomach/Bowel: Normal appendix. Stomach, large and small bowel grossly unremarkable. Vascular/Lymphatic: Aortic atherosclerosis. No evidence of aneurysm or adenopathy. Reproductive: Radiation seeds in the region of the prostate. Other: No free fluid or free air. Musculoskeletal: Prior right hip replacement. No acute bony abnormality. IMPRESSION: No acute findings or evidence of significant traumatic injury in the chest, abdomen or pelvis. Coronary artery disease, aortic atherosclerosis. Electronically Signed   By: Charlett Nose M.D.   On: 08/12/2023 19:28   CT Head Wo Contrast  Result Date: 08/12/2023 CLINICAL DATA:  Neck trauma (Age >= 65y); Head trauma, minor (Age >= 65y). Altered mental status. EXAM: CT HEAD WITHOUT CONTRAST CT CERVICAL SPINE WITHOUT CONTRAST TECHNIQUE: Multidetector CT imaging of the head and cervical spine was performed following the standard protocol without intravenous contrast. Multiplanar CT image reconstructions of the cervical spine were also generated.  RADIATION DOSE REDUCTION: This exam was performed according to the departmental dose-optimization program which includes automated exposure control, adjustment of the mA and/or kV according to patient size and/or use of iterative reconstruction technique. COMPARISON:  CT head and cervical spine 11/17/2022. FINDINGS: CT HEAD FINDINGS Brain: No acute intracranial hemorrhage. Gray-white differentiation is preserved. No hydrocephalus or extra-axial collection. No mass effect or midline shift. Vascular: No hyperdense vessel or unexpected calcification. Skull: No calvarial fracture or suspicious bone lesion. Skull base is unremarkable. Sinuses/Orbits: No acute finding. Other: None. CT CERVICAL SPINE FINDINGS Alignment: Normal. Skull base and vertebrae: No acute fracture. Normal craniocervical junction. No suspicious bone lesions. Multilevel degenerative disc height loss with degenerative endplate changes. Acquired fusion of the C6-7 level. Soft tissues and spinal canal: No prevertebral fluid or swelling. No visible canal hematoma. Disc levels: Unchanged multilevel cervical spondylosis, worst at C5-6, where there is at least moderate spinal canal stenosis. Upper chest: No acute findings. Other: Atherosclerotic calcifications of the carotid bulbs. IMPRESSION: 1. No acute intracranial abnormality. 2. No acute cervical spine fracture or traumatic listhesis. 3. Unchanged multilevel cervical spondylosis, worst at C5-6, where there is at least moderate spinal canal stenosis. Electronically Signed   By: Orvan Falconer M.D.   On: 08/12/2023 19:20   CT Cervical Spine Wo Contrast  Result Date: 08/12/2023 CLINICAL DATA:  Neck trauma (Age >= 65y); Head trauma, minor (Age >= 65y). Altered mental status. EXAM: CT HEAD WITHOUT CONTRAST CT CERVICAL SPINE WITHOUT CONTRAST TECHNIQUE: Multidetector CT imaging of the  head and cervical spine was performed following the standard protocol without intravenous contrast. Multiplanar CT image  reconstructions of the cervical spine were also generated. RADIATION DOSE REDUCTION: This exam was performed according to the departmental dose-optimization program which includes automated exposure control, adjustment of the mA and/or kV according to patient size and/or use of iterative reconstruction technique. COMPARISON:  CT head and cervical spine 11/17/2022. FINDINGS: CT HEAD FINDINGS Brain: No acute intracranial hemorrhage. Gray-white differentiation is preserved. No hydrocephalus or extra-axial collection. No mass effect or midline shift. Vascular: No hyperdense vessel or unexpected calcification. Skull: No calvarial fracture or suspicious bone lesion. Skull base is unremarkable. Sinuses/Orbits: No acute finding. Other: None. CT CERVICAL SPINE FINDINGS Alignment: Normal. Skull base and vertebrae: No acute fracture. Normal craniocervical junction. No suspicious bone lesions. Multilevel degenerative disc height loss with degenerative endplate changes. Acquired fusion of the C6-7 level. Soft tissues and spinal canal: No prevertebral fluid or swelling. No visible canal hematoma. Disc levels: Unchanged multilevel cervical spondylosis, worst at C5-6, where there is at least moderate spinal canal stenosis. Upper chest: No acute findings. Other: Atherosclerotic calcifications of the carotid bulbs. IMPRESSION: 1. No acute intracranial abnormality. 2. No acute cervical spine fracture or traumatic listhesis. 3. Unchanged multilevel cervical spondylosis, worst at C5-6, where there is at least moderate spinal canal stenosis. Electronically Signed   By: Orvan Falconer M.D.   On: 08/12/2023 19:20   DG Chest Port 1 View  Result Date: 08/12/2023 CLINICAL DATA:  Shoulder pain EXAM: PORTABLE CHEST 1 VIEW COMPARISON:  11/17/2022 FINDINGS: The heart size and mediastinal contours are within normal limits. Aortic atherosclerosis. Both lungs are clear. The visualized skeletal structures are unremarkable. IMPRESSION: No active  disease. Electronically Signed   By: Jasmine Pang M.D.   On: 08/12/2023 17:56   DG Shoulder Right  Result Date: 08/12/2023 CLINICAL DATA:  Shoulder pain EXAM: RIGHT SHOULDER - 2+ VIEW COMPARISON:  None Available. FINDINGS: No fracture or malalignment. Chronic AC joint widening and mild deformity. Mild degenerative change at the glenohumeral interval. IMPRESSION: No acute osseous abnormality Electronically Signed   By: Jasmine Pang M.D.   On: 08/12/2023 17:55   DG Shoulder Left  Result Date: 08/12/2023 CLINICAL DATA:  Patient was found lying on the floor of his bathroom. Shoulder pain. EXAM: LEFT SHOULDER - 2+ VIEW COMPARISON:  None Available. FINDINGS: No acute fracture or dislocation. Superior subluxation of the humeral head in relation to the glenoid likely due to rotator cuff pathology. Degenerative arthritis AC and glenohumeral joints. IMPRESSION: 1. No acute fracture or dislocation. 2. Findings compatible with rotator cuff pathology. Electronically Signed   By: Minerva Fester M.D.   On: 08/12/2023 17:53     Labs:   Basic Metabolic Panel: Recent Labs  Lab 08/13/23 0335 08/14/23 0532 08/15/23 0355 08/16/23 0636 08/17/23 0700  NA 138 137 137 137 136  K 4.2 4.6 4.5 4.1 4.4  CL 104 105 105 103 103  CO2 21* 22 21* 24 24  GLUCOSE 105* 99 102* 92 117*  BUN 26* 19 15 20 18   CREATININE 1.22 1.02 0.95 1.32* 1.28*  CALCIUM 8.9 9.1 9.3 9.2 9.3  MG 2.2 1.9 1.9 1.8 1.9  PHOS 3.2  --   --   --   --    GFR Estimated Creatinine Clearance: 49 mL/min (A) (by C-G formula based on SCr of 1.28 mg/dL (H)). Liver Function Tests: Recent Labs  Lab 08/13/23 0335 08/14/23 0532 08/15/23 0355 08/16/23 0636 08/17/23 0700  AST  134* 97* 79* 54* 50*  ALT 54* 50* 49* 43 39  ALKPHOS 55 53 50 54 50  BILITOT 2.6* 1.8* 1.8* 1.1 1.4*  PROT 6.5 6.0* 6.3* 6.3* 6.0*  ALBUMIN 3.3* 3.1* 3.2* 3.1* 3.0*   No results for input(s): "LIPASE", "AMYLASE" in the last 168 hours. Recent Labs  Lab  08/12/23 1549  AMMONIA 15   Coagulation profile No results for input(s): "INR", "PROTIME" in the last 168 hours.  CBC: Recent Labs  Lab 08/13/23 0336 08/14/23 0532 08/15/23 0355 08/16/23 0636 08/17/23 0700  WBC 5.8 4.2 4.0 3.7* 4.5  HGB 17.6* 16.7 17.4* 16.6 16.1  HCT 49.7 48.2 49.0 47.6 46.1  MCV 88.1 87.5 86.9 85.6 85.4  PLT 146* 157 179 192 213   Cardiac Enzymes: Recent Labs  Lab 08/12/23 1426 08/13/23 0335 08/14/23 0532 08/15/23 0355 08/16/23 0636  CKTOTAL 5,591* 3,423* 1,464* 950* 440*   BNP: Invalid input(s): "POCBNP" CBG: Recent Labs  Lab 08/12/23 1437 08/12/23 2323  GLUCAP 100* 93   D-Dimer No results for input(s): "DDIMER" in the last 72 hours. Hgb A1c No results for input(s): "HGBA1C" in the last 72 hours. Lipid Profile No results for input(s): "CHOL", "HDL", "LDLCALC", "TRIG", "CHOLHDL", "LDLDIRECT" in the last 72 hours. Thyroid function studies No results for input(s): "TSH", "T4TOTAL", "T3FREE", "THYROIDAB" in the last 72 hours.  Invalid input(s): "FREET3" Anemia work up No results for input(s): "VITAMINB12", "FOLATE", "FERRITIN", "TIBC", "IRON", "RETICCTPCT" in the last 72 hours. Microbiology No results found for this or any previous visit (from the past 240 hour(s)).   Discharge Instructions:    Allergies as of 08/17/2023       Reactions   Sildenafil Other (See Comments)   Other reaction(s): Blurring of visual image        Medication List     STOP taking these medications    apixaban 2.5 MG Tabs tablet Commonly known as: ELIQUIS       TAKE these medications    acetaminophen 500 MG tablet Commonly known as: TYLENOL Take 500 mg by mouth every 6 (six) hours as needed.   aspirin EC 81 MG tablet Take 81 mg by mouth daily. Swallow whole.   Carboxymethylcellulose Sod PF 1 % Gel Apply 1 drop to eye See admin instructions. 2-4 times daily for dry eye.   DULoxetine 20 MG capsule Commonly known as: CYMBALTA Take 20 mg by  mouth daily.   ergocalciferol 1.25 MG (50000 UT) capsule Commonly known as: VITAMIN D2 Take 50,000 Units by mouth every Monday.   levothyroxine 150 MCG tablet Commonly known as: SYNTHROID Take 150 mcg by mouth daily before breakfast.   lisinopril 10 MG tablet Commonly known as: ZESTRIL Take 10 mg by mouth daily.   tamsulosin 0.4 MG Caps capsule Commonly known as: FLOMAX Take 0.4 mg by mouth daily.        Follow-up Information     Knox Royalty, MD Follow up in 1 week(s).   Specialty: Family Medicine Contact information: [address] [place] [address] [phone]                  Time coordinating discharge: 45 min  Signed:  Joseph Art DO  [facility] 08/17/2023, 9:57 AM

## 2023-12-26 ENCOUNTER — Ambulatory Visit: Payer: Medicare Other | Admitting: Physician Assistant

## 2024-01-09 ENCOUNTER — Ambulatory Visit (INDEPENDENT_AMBULATORY_CARE_PROVIDER_SITE_OTHER): Payer: Medicare Other | Admitting: Physician Assistant

## 2024-01-09 ENCOUNTER — Other Ambulatory Visit: Payer: Self-pay | Admitting: Radiology

## 2024-01-09 ENCOUNTER — Other Ambulatory Visit (INDEPENDENT_AMBULATORY_CARE_PROVIDER_SITE_OTHER)

## 2024-01-09 ENCOUNTER — Encounter: Payer: Self-pay | Admitting: Physician Assistant

## 2024-01-09 DIAGNOSIS — M7061 Trochanteric bursitis, right hip: Secondary | ICD-10-CM

## 2024-01-09 DIAGNOSIS — M25512 Pain in left shoulder: Secondary | ICD-10-CM

## 2024-01-09 DIAGNOSIS — M25511 Pain in right shoulder: Secondary | ICD-10-CM

## 2024-01-09 DIAGNOSIS — G8929 Other chronic pain: Secondary | ICD-10-CM | POA: Diagnosis not present

## 2024-01-09 DIAGNOSIS — M19011 Primary osteoarthritis, right shoulder: Secondary | ICD-10-CM

## 2024-01-09 MED ORDER — METHYLPREDNISOLONE ACETATE 40 MG/ML IJ SUSP
40.0000 mg | INTRAMUSCULAR | Status: AC | PRN
Start: 2024-01-09 — End: 2024-01-09
  Administered 2024-01-09: 40 mg via INTRA_ARTICULAR

## 2024-01-09 MED ORDER — LIDOCAINE HCL 1 % IJ SOLN
3.0000 mL | INTRAMUSCULAR | Status: AC | PRN
Start: 2024-01-09 — End: 2024-01-09
  Administered 2024-01-09: 3 mL

## 2024-01-09 NOTE — Progress Notes (Signed)
 Office Visit Note   Patient: [name] [name]           Date of Birth: 11-Feb-1932           MRN: [id number] Visit Date: 01/09/2024              Requested by: Hyacinth Meeker, MD [address] [place],  [place] [address] PCP: Knox Royalty, MD   Assessment & Plan: Visit Diagnoses:  1. Trochanteric bursitis, right hip   2. Chronic right shoulder pain   3. Chronic left shoulder pain     Plan: Given patient's bilateral shoulder pain and arthritis recommend bilateral intra-articular injections under ultrasound with Dr. Shon Baton.  Will work on setting this up for him.  He tolerated the right hip trochanteric injection well today.  Will see him back in 4 weeks see how he is doing overall.  Questions encouraged and answered at length.  Follow-Up Instructions: Return in about 4 weeks (around 02/06/2024).   Orders:  Orders Placed This Encounter  Procedures   Large Joint Inj: R greater trochanter   XR HIP UNILAT W OR W/O PELVIS 2-3 VIEWS RIGHT   XR Shoulder 1V Right   XR Shoulder 1V Left   No orders of the defined types were placed in this encounter.     Procedures: Large Joint Inj: R greater trochanter on 01/09/2024 5:07 PM Indications: pain Details: 22 G 1.5 in needle, lateral approach  Arthrogram: No  Medications: 3 mL lidocaine 1 %; 40 mg methylPREDNISolone acetate 40 MG/ML Outcome: tolerated well, no immediate complications Procedure, treatment alternatives, risks and benefits explained, specific risks discussed. Consent was given by the patient. Immediately prior to procedure a time out was called to verify the correct patient, procedure, equipment, support staff and site/side marked as required. Patient was prepped and draped in the usual sterile fashion.       Clinical Data: No additional findings.   Subjective: Chief Complaint  Patient presents with   Right Shoulder - Pain   Right Hip - Pain    HPI Patient is a 89 year old male who comes in today due to bilateral  shoulder pain and right hip pain.  He has had bilateral shoulder pain for some time but did have a fall in October 2024 onto the right shoulder.  He underwent radiographs of both shoulders which are reviewed.  These are dated 08/12/2023.  The radiographs are personally reviewed of both shoulders and show mild degenerative changes the glenohumeral joint on the right.  No acute fractures.  Chronic widening AC joint. Left shoulder AP and Y view show no acute fractures.  High riding humeral head.  Degenerative changes AC joint. Patient somewhat of poor historian says that he has had at least 5 surgeries on his right hip since 1970s.  He seems slightly confused about when his last surgery was performed but he states he had 1 in [place] and gets confused of when his last surgery was performed which by our notes and talking to him for extended period time appears that the last surgery was done by Dr. Antony Odea in 2012 here in [place].  He is having mainly lateral hip pain no radicular symptoms.  Hip gives way at times.  He uses a walker but can go without it.  He has tried Tylenol for the shoulder shoulder and the hip pains.    Review of Systems  Constitutional:  Negative for chills and fever.     Objective: Vital Signs: There were no vitals  taken for this visit.  Physical Exam Constitutional:      Appearance: He is normal weight. He is not ill-appearing or diaphoretic.  Pulmonary:     Effort: Pulmonary effort is normal.  Neurological:     Mental Status: He is alert and oriented to person, place, and time.  Psychiatric:        Mood and Affect: Mood normal.     Ortho Exam Bilateral shoulders 5-5 strength with external and internal rotation against resistance.  Right shoulder active forward flexion 175 passively and bring to 180 degrees.  Left shoulder actively 170 degrees of forward flexion and passively at 175 degrees.  External and internal rotation bilateral shoulders somewhat limited and  painful.  Crepitus right shoulder.  Empty can test is negative bilaterally.  Right hip excellent range of motion without pain.  Well-healed surgical incision no signs of infection.  Tenderness over the trochanteric region.  Specialty Comments:  No specialty comments available.  Imaging: XR HIP UNILAT W OR W/O PELVIS 2-3 VIEWS RIGHT Result Date: 01/09/2024 AP pelvis multiple views of right hip: Status post right total hip arthroplasty well-seated components.  Multiple cerclage wires present status post traumatic versus iatrogenic fracture.  Fracture appears well-healed.  Hips well located.  No eccentric wear.  Cup appears well fixated with multiple screws.  XR Shoulder 1V Left Result Date: 01/09/2024 Left shoulder axillary view shows the humeral head to be well located.  Mild to moderate narrowing.  No acute fractures.  No acute findings.  XR Shoulder 1V Right Result Date: 01/09/2024 Axillary view right shoulder: No acute fractures shoulder is well located.  Slight flattening of the humeral head consistent with mild to moderate arthritic changes otherwise glenohumeral joint is well-maintained.     PMFS History: Patient Active Problem List   Diagnosis Date Noted   Ground-level fall 08/12/2023   Falls frequently 11/17/2022   Atrial fibrillation (HCC) 11/17/2022   COVID-19 virus infection 11/17/2022   B12 deficiency 03/10/2021   DIZZINESS 08/28/2009   SHOULDER PAIN, LEFT 08/25/2009   FATIGUE 08/25/2009   Type 2 diabetes mellitus without complication, without long-term current use of insulin (HCC) 03/05/2008   HYPOGONADISM, MALE 03/05/2008   CONDUCTIVE HEARING LOSS BILATERAL 03/05/2008   Allergic rhinitis 03/05/2008   NEOPLASM, MALIGNANT, PROSTATE 09/05/2007   Hypothyroidism 09/05/2007   HYPERLIPIDEMIA 09/05/2007   ANEMIA-IRON DEFICIENCY 09/05/2007   Anxiety state 09/05/2007   ERECTILE DYSFUNCTION 09/05/2007   DEPRESSION 09/05/2007   Migraine without aura 09/05/2007   Essential  hypertension 09/05/2007   BRADYCARDIA, CHRONIC 09/05/2007   GERD 09/05/2007   SWELLING MASS OR LUMP IN HEAD AND NECK 09/05/2007   GLYCOSURIA 09/05/2007   DECREASED LIBIDO 09/05/2007   GLUCOSE INTOLERANCE, HX OF 09/05/2007   History of cardiovascular disorder 09/05/2007   VOCAL CORD POLYP, HX OF 09/05/2007   Past Medical History:  Diagnosis Date   B12 deficiency    Chronic heart failure with preserved ejection fraction (HFpEF) (HCC)    Falls    Hypertension    Hypothyroidism    Iron deficiency anemia    Prostate cancer (HCC)    Trifascicular block     Family History  Problem Relation Age of Onset   Heart disease Mother    Throat cancer Mother    Hypertension Father    Prostate cancer Brother    Prostate cancer Nephew     Past Surgical History:  Procedure Laterality Date   HIP SURGERY     Social History   Occupational History  Occupation: retired   Tobacco Use   Smoking status: Former    Types: Cigarettes   Smokeless tobacco: Never  Substance and Sexual Activity   Alcohol use: Not Currently    Comment: Stopped 30 years ago   Drug use: Never   Sexual activity: Not on file

## 2024-02-06 ENCOUNTER — Encounter: Payer: Self-pay | Admitting: Physician Assistant

## 2024-02-06 ENCOUNTER — Ambulatory Visit (INDEPENDENT_AMBULATORY_CARE_PROVIDER_SITE_OTHER): Admitting: Physician Assistant

## 2024-02-06 ENCOUNTER — Other Ambulatory Visit (INDEPENDENT_AMBULATORY_CARE_PROVIDER_SITE_OTHER): Payer: Self-pay

## 2024-02-06 DIAGNOSIS — M5441 Lumbago with sciatica, right side: Secondary | ICD-10-CM

## 2024-02-06 NOTE — Progress Notes (Signed)
 Office Visit Note   Patient: [name] [name] [name]           Date of Birth: 1932/03/06           MRN: [id number] Visit Date: 02/06/2024              Requested by: Knox Royalty, MD [address] [name] [name] [name],  [place] [address] PCP: Knox Royalty, MD   Assessment & Plan: Visit Diagnoses:  1. Acute right-sided low back pain with right-sided sciatica     Plan: We will call transportation at the facility which she resides to schedule transportation for intra-articular injections of both hips.  Recommend Medrol Dosepak over 6-day for his back pain.  Therefore would recommend intra-articular injection of both shoulders in 2 weeks at the earliest.  Also recommended formal therapy for his back that is made to work on core strengthening, stretching, and include modalities.  Have him follow-up with Dr. Magnus Ivan in 4 to 6 weeks see how he is doing overall.  Questions were encouraged and answered at length.  Follow-Up Instructions: Return in about 6 weeks (around 03/19/2024), or Dr. Magnus Ivan.   Orders:  Orders Placed This Encounter  Procedures   XR Lumbar Spine 2-3 Views   No orders of the defined types were placed in this encounter.     Procedures: No procedures performed   Clinical Data: No additional findings.   Subjective: Chief Complaint  Patient presents with   Right Shoulder - Follow-up   Left Shoulder - Follow-up    HPI [name] [name] [name] returns today for follow-up status post right hip trochanteric injection 01/09/2023.  States that he got very little relief.  He is now having low back pain with radicular symptoms down the right leg to the mid calf and at times down into the foot and into the all the toes.  He has had no new injury.  Denies any change in bowel or bladder dysfunction has occasional urinary urgency.  No saddle anesthesia no waking pain no fevers or chills.  Ranks his back pain and radicular symptoms down the right leg to be 7 out of 10 pain at worst.  Review of Systems Negative  for fevers chills  Objective: Vital Signs: There were no vitals taken for this visit.  Physical Exam General: Well-developed well-nourished male who ambulates with a rolling walker. Psych: Alert and oriented x 3 Vascular: Dorsal pedal pulses are present bilaterally calves are supple nontender.  Ortho Exam Lower extremities: Negative straight leg raise bilaterally.  5 out of 5 strength throughout lower extremities against resistance.  He comes within a couple of inches of being able to touch his toes.  Extension of the lumbar spine causes lower lumbar pain. Specialty Comments:  No specialty comments available.  Imaging: XR Lumbar Spine 2-3 Views Result Date: 02/06/2024 Lumbar spine 2 views: Slight levoscoliosis.  No acute fractures.  L3-4 L4-5 degenerative disc disease.  No spondylolisthesis.  Lower lumbar facet degenerative changes.  Arthrosclerosis of the aorta.    PMFS History: Patient Active Problem List   Diagnosis Date Noted   Ground-level fall 08/12/2023   Falls frequently 11/17/2022   Atrial fibrillation (HCC) 11/17/2022   COVID-19 virus infection 11/17/2022   B12 deficiency 03/10/2021   DIZZINESS 08/28/2009   SHOULDER PAIN, LEFT 08/25/2009   FATIGUE 08/25/2009   Type 2 diabetes mellitus without complication, without long-term current use of insulin (HCC) 03/05/2008   HYPOGONADISM, MALE 03/05/2008   CONDUCTIVE HEARING LOSS BILATERAL 03/05/2008   Allergic rhinitis 03/05/2008  NEOPLASM, MALIGNANT, PROSTATE 09/05/2007   Hypothyroidism 09/05/2007   HYPERLIPIDEMIA 09/05/2007   ANEMIA-IRON DEFICIENCY 09/05/2007   Anxiety state 09/05/2007   ERECTILE DYSFUNCTION 09/05/2007   DEPRESSION 09/05/2007   Migraine without aura 09/05/2007   Essential hypertension 09/05/2007   BRADYCARDIA, CHRONIC 09/05/2007   GERD 09/05/2007   SWELLING MASS OR LUMP IN HEAD AND NECK 09/05/2007   GLYCOSURIA 09/05/2007   DECREASED LIBIDO 09/05/2007   GLUCOSE INTOLERANCE, HX OF 09/05/2007    History of cardiovascular disorder 09/05/2007   VOCAL CORD POLYP, HX OF 09/05/2007   Past Medical History:  Diagnosis Date   B12 deficiency    Chronic heart failure with preserved ejection fraction (HFpEF) (HCC)    Falls    Hypertension    Hypothyroidism    Iron deficiency anemia    Prostate cancer (HCC)    Trifascicular block     Family History  Problem Relation Age of Onset   Heart disease Mother    Throat cancer Mother    Hypertension Father    Prostate cancer Brother    Prostate cancer Nephew     Past Surgical History:  Procedure Laterality Date   HIP SURGERY     Social History   Occupational History   Occupation: retired   Tobacco Use   Smoking status: Former    Types: Cigarettes   Smokeless tobacco: Never  Substance and Sexual Activity   Alcohol use: Not Currently    Comment: Stopped 30 years ago   Drug use: Never   Sexual activity: Not on file

## 2024-02-07 ENCOUNTER — Other Ambulatory Visit: Payer: Self-pay | Admitting: Radiology

## 2024-02-07 DIAGNOSIS — M19011 Primary osteoarthritis, right shoulder: Secondary | ICD-10-CM

## 2024-02-22 ENCOUNTER — Encounter: Payer: Self-pay | Admitting: Sports Medicine

## 2024-02-22 ENCOUNTER — Other Ambulatory Visit: Payer: Self-pay

## 2024-02-22 ENCOUNTER — Ambulatory Visit (INDEPENDENT_AMBULATORY_CARE_PROVIDER_SITE_OTHER): Admitting: Sports Medicine

## 2024-02-22 DIAGNOSIS — M25512 Pain in left shoulder: Secondary | ICD-10-CM | POA: Diagnosis not present

## 2024-02-22 DIAGNOSIS — M25511 Pain in right shoulder: Secondary | ICD-10-CM

## 2024-02-22 DIAGNOSIS — G8929 Other chronic pain: Secondary | ICD-10-CM

## 2024-02-22 DIAGNOSIS — M19011 Primary osteoarthritis, right shoulder: Secondary | ICD-10-CM

## 2024-02-22 DIAGNOSIS — E119 Type 2 diabetes mellitus without complications: Secondary | ICD-10-CM

## 2024-02-22 DIAGNOSIS — M19012 Primary osteoarthritis, left shoulder: Secondary | ICD-10-CM | POA: Diagnosis not present

## 2024-02-22 MED ORDER — METHYLPREDNISOLONE ACETATE 40 MG/ML IJ SUSP
40.0000 mg | INTRAMUSCULAR | Status: AC | PRN
Start: 2024-02-22 — End: 2024-02-22
  Administered 2024-02-22: 40 mg via INTRA_ARTICULAR

## 2024-02-22 MED ORDER — BUPIVACAINE HCL 0.25 % IJ SOLN
2.0000 mL | INTRAMUSCULAR | Status: AC | PRN
Start: 2024-02-22 — End: 2024-02-22
  Administered 2024-02-22: 2 mL via INTRA_ARTICULAR

## 2024-02-22 MED ORDER — LIDOCAINE HCL 1 % IJ SOLN
2.0000 mL | INTRAMUSCULAR | Status: AC | PRN
Start: 2024-02-22 — End: 2024-02-22
  Administered 2024-02-22: 2 mL

## 2024-02-22 NOTE — Progress Notes (Signed)
 [name] [name] - 89 y.o. male MRN [id number]  Date of birth: 1931/12/15  Office Visit Note: Visit Date: 02/22/2024 PCP: Trellis Fries, MD Referred by: Bronson Canny, PA-C  Subjective: Chief Complaint  Patient presents with   Right Shoulder - Pain   Left Shoulder - Pain   HPI: [name] [name] is a pleasant 89 y.o. male who presents today for chronic bilateral shoulder pain with OA. Also, having some R > L hip pain.  Bilateral shoulders -he reports chronic bilateral shoulder pain.  He is right-hand dominant but both shoulders bother him.  No specific injury.  He denies any swelling or effusion.  Worse with certain reaching motions.  Right hip pain -he did have a trochanteric bursa injection with Malena Scull in the past without much relief.  I did review his x-ray which show a previous hip replacement with intramedullary nail likely from a femoral fracture.  Lab Results  Component Value Date   HGBA1C 5.5 08/13/2023   Pertinent ROS were reviewed with the patient and found to be negative unless otherwise specified above in HPI.   Assessment & Plan: Visit Diagnoses:  1. Primary osteoarthritis of shoulders, bilateral   2. Chronic left shoulder pain   3. Chronic right shoulder pain   4. Type 2 diabetes mellitus without complication, without long-term current use of insulin (HCC)    Plan: Impression is chronic bilateral shoulder pain with both osteoarthritis as well as likely significant rotator cuff arthropathy given his high riding humeral heads.  Through shared decision-making, did proceed with ultrasound-guided bilateral glenohumeral joint injections, patient tolerated well.  Advised on postinjection protocol.  May use ice/heat or Tylenol  for any postinjection pain.  He is a type II diabetic but his A1c is well-controlled and he is diet managed.  He did mention some ongoing right hip pain which did not respond to a greater trochanteric bursa injection.  Given his hip replacement, I will  have him follow-up with Dr. Lucienne Ryder for this.  I am happy to see him back as needed.  I did fill out his paperwork for today's visit to return to his care facility today.  Follow-up: Return in about 1 month (around 03/23/2024) for with Dr. Lucienne Ryder for R-hip, b/l shoulders .   Meds & Orders: No orders of the defined types were placed in this encounter.   Orders Placed This Encounter  Procedures   Large Joint Inj: R glenohumeral   Large Joint Inj: L glenohumeral   US  Guided Needle Placement - No Linked Charges     Procedures: Large Joint Inj: R glenohumeral on 02/22/2024 9:43 AM Indications: pain Details: 22 G 3.5 in needle, ultrasound-guided posterior approach Medications: 2 mL lidocaine  1 %; 2 mL bupivacaine  0.25 %; 40 mg methylPREDNISolone  acetate 40 MG/ML Outcome: tolerated well, no immediate complications  US -guided glenohumeral joint injection, right shoulder After discussion on risks/benefits/indications, informed verbal consent was obtained. A timeout was then performed. The patient was positioned lying lateral recumbent on examination table. The patient's shoulder was prepped with betadine and multiple alcohol swabs and utilizing ultrasound guidance, the patient's glenohumeral joint was identified on ultrasound. Using ultrasound guidance a 22-gauge, 3.5 inch needle with a mixture of 2:2:1 cc's lidocaine :bupivicaine:depomedrol was directed from a lateral to medial direction via in-plane technique into the glenohumeral joint with visualization of appropriate spread of injectate into the joint. Patient tolerated the procedure well without immediate complications.      Procedure, treatment alternatives, risks and benefits explained, specific risks  discussed. Consent was given by the patient. Immediately prior to procedure a time out was called to verify the correct patient, procedure, equipment, support staff and site/side marked as required. Patient was prepped and draped in the usual  sterile fashion.    Large Joint Inj: L glenohumeral on 02/22/2024 9:43 AM Indications: pain Details: 22 G 3.5 in needle, ultrasound-guided posterior approach Medications: 2 mL lidocaine  1 %; 2 mL bupivacaine  0.25 %; 40 mg methylPREDNISolone  acetate 40 MG/ML Outcome: tolerated well, no immediate complications  US -guided glenohumeral joint injection, left shoulder After discussion on risks/benefits/indications, informed verbal consent was obtained. A timeout was then performed. The patient was positioned lying lateral recumbent on examination table. The patient's shoulder was prepped with betadine and multiple alcohol swabs and utilizing ultrasound guidance, the patient's glenohumeral joint was identified on ultrasound. Using ultrasound guidance a 22-gauge, 3.5 inch needle with a mixture of 2:2:1 cc's lidocaine :bupivicaine:depomedrol was directed from a lateral to medial direction via in-plane technique into the glenohumeral joint with visualization of appropriate spread of injectate into the joint. Patient tolerated the procedure well without immediate complications.      Procedure, treatment alternatives, risks and benefits explained, specific risks discussed. Consent was given by the patient. Immediately prior to procedure a time out was called to verify the correct patient, procedure, equipment, support staff and site/side marked as required. Patient was prepped and draped in the usual sterile fashion.          Clinical History: No specialty comments available.  He reports that he has quit smoking. He has never used smokeless tobacco.  Recent Labs    08/13/23 0336  HGBA1C 5.5    Objective:    Physical Exam  Gen: Well-appearing, in no acute distress; non-toxic CV: Well-perfused. Warm.  Resp: Breathing unlabored on room air; no wheezing. Psych: Fluid speech in conversation; appropriate affect; normal thought process  Ortho Exam - Bilateral shoulders: There is limited active and  passive range of motion in all directions with some mild grating through end range of motion.  No significant effusion.  No redness or swelling.  Imaging:  *Independent review and interpretation of three-view right shoulder and 3 view left shoulder x-ray from 08/12/2023 as well as 1 view x-ray, axial of the right and left shoulder from 01/09/2024 was performed by myself today.  X-rays demonstrate mild to moderate glenohumeral joint arthritic change.  The right shoulder has a likely prior distal clavicle excision, the left shoulder has severe AC joint arthritis.  There is high riding humeral heads bilaterally indicative of significant rotator cuff arthropathy.  Past Medical/Family/Surgical/Social History: Medications & Allergies reviewed per EMR, new medications updated. Patient Active Problem List   Diagnosis Date Noted   Ground-level fall 08/12/2023   Falls frequently 11/17/2022   Atrial fibrillation (HCC) 11/17/2022   COVID-19 virus infection 11/17/2022   B12 deficiency 03/10/2021   DIZZINESS 08/28/2009   SHOULDER PAIN, LEFT 08/25/2009   FATIGUE 08/25/2009   Type 2 diabetes mellitus without complication, without long-term current use of insulin (HCC) 03/05/2008   HYPOGONADISM, MALE 03/05/2008   CONDUCTIVE HEARING LOSS BILATERAL 03/05/2008   Allergic rhinitis 03/05/2008   NEOPLASM, MALIGNANT, PROSTATE 09/05/2007   Hypothyroidism 09/05/2007   HYPERLIPIDEMIA 09/05/2007   ANEMIA-IRON DEFICIENCY 09/05/2007   Anxiety state 09/05/2007   ERECTILE DYSFUNCTION 09/05/2007   DEPRESSION 09/05/2007   Migraine without aura 09/05/2007   Essential hypertension 09/05/2007   BRADYCARDIA, CHRONIC 09/05/2007   GERD 09/05/2007   SWELLING MASS OR LUMP IN HEAD AND NECK  09/05/2007   GLYCOSURIA 09/05/2007   DECREASED LIBIDO 09/05/2007   GLUCOSE INTOLERANCE, HX OF 09/05/2007   History of cardiovascular disorder 09/05/2007   VOCAL CORD POLYP, HX OF 09/05/2007   Past Medical History:  Diagnosis Date    B12 deficiency    Chronic heart failure with preserved ejection fraction (HFpEF) (HCC)    Falls    Hypertension    Hypothyroidism    Iron deficiency anemia    Prostate cancer (HCC)    Trifascicular block    Family History  Problem Relation Age of Onset   Heart disease Mother    Throat cancer Mother    Hypertension Father    Prostate cancer Brother    Prostate cancer Nephew    Past Surgical History:  Procedure Laterality Date   HIP SURGERY     Social History   Occupational History   Occupation: retired   Tobacco Use   Smoking status: Former    Types: Cigarettes   Smokeless tobacco: Never  Substance and Sexual Activity   Alcohol use: Not Currently    Comment: Stopped 30 years ago   Drug use: Never   Sexual activity: Not on file

## 2024-03-22 ENCOUNTER — Encounter: Payer: Self-pay | Admitting: Orthopaedic Surgery

## 2024-03-22 ENCOUNTER — Ambulatory Visit (INDEPENDENT_AMBULATORY_CARE_PROVIDER_SITE_OTHER): Admitting: Orthopaedic Surgery

## 2024-03-22 DIAGNOSIS — G8929 Other chronic pain: Secondary | ICD-10-CM | POA: Diagnosis not present

## 2024-03-22 DIAGNOSIS — M25512 Pain in left shoulder: Secondary | ICD-10-CM | POA: Diagnosis not present

## 2024-03-22 DIAGNOSIS — M25511 Pain in right shoulder: Secondary | ICD-10-CM | POA: Diagnosis not present

## 2024-03-22 DIAGNOSIS — M25551 Pain in right hip: Secondary | ICD-10-CM

## 2024-03-22 NOTE — Progress Notes (Signed)
 The patient is an 89 year old gentleman who stays in assisted living who is following up after having steroid injections in both shoulders under ultrasound by Dr. Vaughn Georges.  He does walk slowly with a rolling walker.  He has had a history of multiple operations on his right hip and he does have chronic right hip pain.  He says has been over a dozen operations.  His x-rays that were obtained earlier this year of the pelvis and right hip shows evidence of multiple surgeries on his right hip with a revision arthroplasty and cables as well.  The bone quality does not support any further surgery on his right hip.  He said the injections in his shoulders did not work a [employer].  His shoulders have some arthritic changes.  A lot of pain of the shoulders probably comes from walk with a walker.  He says most of his pain is also all around his hip in general.  He does state that the steroid injections were somewhat helpful once probing for more.  I did review his medical history and medications in epic and in his chart.  He is not on anti-inflammatories.  His right hip does hurt on the lateral aspect of the hip but the range of motion is stable and is well located.  I did talk to him at length in detail that I would not recommend any further interventions on his right hip.  He does not have the bone quality of the support to tolerate any other in surgeries at all and having multiple surgeries can lead to chronic pain with time.  I do not see any indication for any other surgery on his right hip.  I will defer any type of chronic pain management or anti-inflammatories to his primary care or to the facility where he is staying.

## 2024-04-02 ENCOUNTER — Other Ambulatory Visit: Payer: Self-pay

## 2024-04-02 DIAGNOSIS — I739 Peripheral vascular disease, unspecified: Secondary | ICD-10-CM

## 2024-04-17 NOTE — Progress Notes (Signed)
 Office Note     CC:  Left foot abscess with poor wound healing  Requesting Provider:  Donelda Mailhot Francisco, MD  HPI: [name] [name] is a 89 y.o. (Sep 06, 1932) male presenting at the request of .[name] Guardiola Francisco, MD for left great toe wound.  On exam, [name] was doing well, accompanied by his aide.  He currently resides in assisted living with his wife.  He has 2 children, both of which live out of town.  A native of [place] , he moved to [place]  years ago. He denies symptoms of claudication, ischemic rest pain.  He stated the toe wound has been present for over 2 months.  He denies significant drainage, erythema, induration.  Denies fevers, chills.    Past Medical History:  Diagnosis Date   B12 deficiency    Chronic heart failure with preserved ejection fraction (HFpEF) (HCC)    Falls    Hypertension    Hypothyroidism    Iron deficiency anemia    Prostate cancer (HCC)    Trifascicular block     Past Surgical History:  Procedure Laterality Date   HIP SURGERY      Social History   Socioeconomic History   Marital status: Married    Spouse name: Not on file   Number of children: 3   Years of education: Not on file   Highest education level: Not on file  Occupational History   Occupation: retired   Tobacco Use   Smoking status: Former    Types: Cigarettes   Smokeless tobacco: Never  Substance and Sexual Activity   Alcohol use: Not Currently    Comment: Stopped 30 years ago   Drug use: Never   Sexual activity: Not on file  Other Topics Concern   Not on file  Social History Narrative   Not on file   Social Drivers of Health   Financial Resource Strain: Not on file  Food Insecurity: Unknown (08/13/2023)   Hunger Vital Sign    Worried About Running Out of Food in the Last Year: Patient declined    Ran Out of Food in the Last Year: Never true  Transportation Needs: No Transportation Needs (08/13/2023)   PRAPARE - Administrator, Civil Service (Medical):  No    Lack of Transportation (Non-Medical): No  Physical Activity: Not on file  Stress: Not on file  Social Connections: Not on file  Intimate Partner Violence: Not At Risk (08/13/2023)   Humiliation, Afraid, Rape, and Kick questionnaire    Fear of Current or Ex-Partner: No    Emotionally Abused: No    Physically Abused: No    Sexually Abused: No   Family History  Problem Relation Age of Onset   Heart disease Mother    Throat cancer Mother    Hypertension Father    Prostate cancer Brother    Prostate cancer Nephew     Current Outpatient Medications  Medication Sig Dispense Refill   acetaminophen  (TYLENOL ) 500 MG tablet Take 500 mg by mouth every 6 (six) hours as needed.     aspirin EC 81 MG tablet Take 81 mg by mouth daily. Swallow whole.     Carboxymethylcellulose Sod PF 1 % GEL Apply 1 drop to eye See admin instructions. 2-4 times daily for dry eye.     DULoxetine (CYMBALTA) 20 MG capsule Take 20 mg by mouth daily.     ergocalciferol (VITAMIN D2) 1.25 MG (50000 UT) capsule Take 50,000 Units by mouth every Monday.  levothyroxine  (SYNTHROID ) 150 MCG tablet Take 150 mcg by mouth daily before breakfast.     lisinopril  (ZESTRIL ) 10 MG tablet Take 10 mg by mouth daily.     tamsulosin  (FLOMAX ) 0.4 MG CAPS capsule Take 0.4 mg by mouth daily.     No current facility-administered medications for this visit.    Allergies  Allergen Reactions   Sildenafil Other (See Comments)    Other reaction(s): Blurring of visual image     REVIEW OF SYSTEMS:  [X]  denotes positive finding, [ ]  denotes negative finding Cardiac  Comments:  Chest pain or chest pressure:    Shortness of breath upon exertion:    Short of breath when lying flat:    Irregular heart rhythm:        Vascular    Pain in calf, thigh, or hip brought on by ambulation:    Pain in feet at night that wakes you up from your sleep:     Blood clot in your veins:    Leg swelling:         Pulmonary    Oxygen at home:     Productive cough:     Wheezing:         Neurologic    Sudden weakness in arms or legs:     Sudden numbness in arms or legs:     Sudden onset of difficulty speaking or slurred speech:    Temporary loss of vision in one eye:     Problems with dizziness:         Gastrointestinal    Blood in stool:     Vomited blood:         Genitourinary    Burning when urinating:     Blood in urine:        Psychiatric    Major depression:         Hematologic    Bleeding problems:    Problems with blood clotting too easily:        Skin    Rashes or ulcers:        Constitutional    Fever or chills:      PHYSICAL EXAMINATION:  There were no vitals filed for this visit.  General:  WDWN in NAD; vital signs documented above Gait: Not observed HENT: WNL, normocephalic Pulmonary: normal non-labored breathing , without wheezing Cardiac: regular HR Abdomen: soft, NT, no masses Skin: without rashes Vascular Exam/Pulses:  Right Left  Radial 2+ (normal) 2+ (normal)  Ulnar    Femoral    Popliteal    DP 1+ (weak) 2+ (normal)  PT     Extremities: with ischemic changes, without Gangrene , without cellulitis; with open wounds;  Musculoskeletal: no muscle wasting or atrophy  Neurologic: A&O X 3;  No focal weakness or paresthesias are detected Psychiatric:  The pt has Normal affect.   Non-Invasive Vascular Imaging:   ABI Findings:  +---------+------------------+-----+-----------+--------+  Right   Rt Pressure (mmHg)IndexWaveform   Comment   +---------+------------------+-----+-----------+--------+  Brachial 166                                         +---------+------------------+-----+-----------+--------+  PTA     148               0.89 multiphasic          +---------+------------------+-----+-----------+--------+  DP      153  0.92 biphasic             +---------+------------------+-----+-----------+--------+  Great Toe74                0.45  Abnormal             +---------+------------------+-----+-----------+--------+   +---------+------------------+-----+----------+-------+  Left    Lt Pressure (mmHg)IndexWaveform  Comment  +---------+------------------+-----+----------+-------+  Brachial 163                                       +---------+------------------+-----+----------+-------+  PTA     139               0.84 monophasic         +---------+------------------+-----+----------+-------+  PERO    144               0.87 monophasic         +---------+------------------+-----+----------+-------+  DP      123               0.74 monophasic         +---------+------------------+-----+----------+-------+  Great Toe129               0.78 Normal             +---------+------------------+-----+----------+-------+   +-------+-----------+-----------+------------+------------+  ABI/TBIToday's ABIToday's TBIPrevious ABIPrevious TBI  +-------+-----------+-----------+------------+------------+  Right 0.92       0.45                                 +-------+-----------+-----------+------------+------------+  Left  0.87       0.78                                 +-------+-----------+-----------+------------+------------+     ASSESSMENT/PLAN: EMILIANO WELSHANS is a 89 y.o. male presenting with nonhealing left great toe wound.  Imaging was reviewed, the patient has falsely elevated ABIs and toe pressure with monophasic waveforms throughout the left at the left ankle. On physical exam, he had a palpable dorsalis pedis pulse.  I had a long discussion with her regarding the above.  The wound has been present for over 2 months, and has had very poor wound healing.  He has not seen a podiatrist regarding the toe wound, and has been performing simple wound care.  After discussing the risks and benefits of left lower extremity angiogram for critical limb ischemia with tissue loss of the  great toe that has not healed over the course of 2 months, Ignace elected to proceed. I have also referred him to podiatry for wound management.  I will call his son to update him regarding the above.    Fonda FORBES Rim, MD [facility] [phone] Total time of patient care including pre-visit research, consultation, and documentation greater than 45 minutes

## 2024-04-19 ENCOUNTER — Other Ambulatory Visit: Payer: Self-pay

## 2024-04-19 ENCOUNTER — Encounter: Payer: Self-pay | Admitting: Physician Assistant

## 2024-04-19 ENCOUNTER — Encounter: Payer: Self-pay | Admitting: Vascular Surgery

## 2024-04-19 ENCOUNTER — Ambulatory Visit (INDEPENDENT_AMBULATORY_CARE_PROVIDER_SITE_OTHER): Admitting: Vascular Surgery

## 2024-04-19 ENCOUNTER — Ambulatory Visit (HOSPITAL_COMMUNITY)
Admission: RE | Admit: 2024-04-19 | Discharge: 2024-04-19 | Disposition: A | Discharge status: Home / Self Care | Source: Ambulatory Visit | Attending: Vascular Surgery | Admitting: Vascular Surgery

## 2024-04-19 VITALS — BP 140/86 | HR 70 | Temp 98.1°F | Resp 20 | Ht 76.0 in | Wt 190.9 lb

## 2024-04-19 DIAGNOSIS — I70245 Atherosclerosis of native arteries of left leg with ulceration of other part of foot: Secondary | ICD-10-CM

## 2024-04-19 DIAGNOSIS — I739 Peripheral vascular disease, unspecified: Secondary | ICD-10-CM

## 2024-04-19 LAB — VAS US ABI WITH/WO TBI
Left ABI: 0.87
Right ABI: 0.92

## 2024-04-25 ENCOUNTER — Encounter (HOSPITAL_COMMUNITY)
Admission: RE | Disposition: A | Discharge status: Home / Self Care | Payer: Self-pay | Source: Ambulatory Visit | Attending: Vascular Surgery

## 2024-04-25 ENCOUNTER — Ambulatory Visit (HOSPITAL_COMMUNITY)
Admission: RE | Admit: 2024-04-25 | Discharge: 2024-04-25 | Disposition: A | Discharge status: Home / Self Care | Source: Ambulatory Visit | Attending: Vascular Surgery | Admitting: Vascular Surgery

## 2024-04-25 ENCOUNTER — Other Ambulatory Visit: Payer: Self-pay

## 2024-04-25 DIAGNOSIS — I70245 Atherosclerosis of native arteries of left leg with ulceration of other part of foot: Secondary | ICD-10-CM

## 2024-04-25 DIAGNOSIS — L97529 Non-pressure chronic ulcer of other part of left foot with unspecified severity: Secondary | ICD-10-CM | POA: Diagnosis not present

## 2024-04-25 DIAGNOSIS — Z87891 Personal history of nicotine dependence: Secondary | ICD-10-CM | POA: Insufficient documentation

## 2024-04-25 HISTORY — PX: LOWER EXTREMITY INTERVENTION: CATH118252

## 2024-04-25 HISTORY — PX: ABDOMINAL AORTOGRAM: CATH118222

## 2024-04-25 HISTORY — PX: LOWER EXTREMITY ANGIOGRAPHY: CATH118251

## 2024-04-25 LAB — POCT I-STAT, CHEM 8
BUN: 18 mg/dL (ref 8–23)
Calcium, Ion: 1.19 mmol/L (ref 1.15–1.40)
Chloride: 106 mmol/L (ref 98–111)
Creatinine, Ser: 1.2 mg/dL (ref 0.61–1.24)
Glucose, Bld: 107 mg/dL — ABNORMAL HIGH (ref 70–99)
HCT: 47 % (ref 39.0–52.0)
Hemoglobin: 16 g/dL (ref 13.0–17.0)
Potassium: 4.1 mmol/L (ref 3.5–5.1)
Sodium: 144 mmol/L (ref 135–145)
TCO2: 25 mmol/L (ref 22–32)

## 2024-04-25 SURGERY — ABDOMINAL AORTOGRAM
Anesthesia: LOCAL

## 2024-04-25 MED ORDER — ACETAMINOPHEN 325 MG PO TABS: 650.0000 mg | ORAL_TABLET | ORAL | Status: DC | PRN

## 2024-04-25 MED ORDER — CLOPIDOGREL BISULFATE 300 MG PO TABS
ORAL_TABLET | ORAL | Status: AC
  Filled 2024-04-25: qty 1

## 2024-04-25 MED ORDER — SODIUM CHLORIDE 0.9 % IV SOLN
250.0000 mL | INTRAVENOUS | Status: DC | PRN
Start: 2024-04-25 — End: 2024-04-25

## 2024-04-25 MED ORDER — HYDRALAZINE HCL 20 MG/ML IJ SOLN: 5.0000 mg | INTRAMUSCULAR | Status: DC | PRN

## 2024-04-25 MED ORDER — HYDRALAZINE HCL 20 MG/ML IJ SOLN
INTRAMUSCULAR | Status: DC | PRN
  Administered 2024-04-25 (×2): 5 mg via INTRAVENOUS

## 2024-04-25 MED ORDER — LIDOCAINE HCL (PF) 1 % IJ SOLN
INTRAMUSCULAR | Status: AC
  Filled 2024-04-25: qty 30

## 2024-04-25 MED ORDER — ONDANSETRON HCL 4 MG/2ML IJ SOLN: 4.0000 mg | Freq: Four times a day (QID) | INTRAMUSCULAR | Status: DC | PRN

## 2024-04-25 MED ORDER — SODIUM CHLORIDE 0.9% FLUSH
3.0000 mL | INTRAVENOUS | Status: DC | PRN
Start: 2024-04-25 — End: 2024-04-25

## 2024-04-25 MED ORDER — LIDOCAINE HCL (PF) 1 % IJ SOLN
INTRAMUSCULAR | Status: DC | PRN
  Administered 2024-04-25: 5 mL

## 2024-04-25 MED ORDER — SODIUM CHLORIDE 0.9% FLUSH: 3.0000 mL | Freq: Two times a day (BID) | INTRAVENOUS | Status: DC

## 2024-04-25 MED ORDER — SODIUM CHLORIDE 0.9 % WEIGHT BASED INFUSION: 1.0000 mL/kg/h | INTRAVENOUS | Status: DC

## 2024-04-25 MED ORDER — HEPARIN (PORCINE) IN NACL 1000-0.9 UT/500ML-% IV SOLN
INTRAVENOUS | Status: DC | PRN
  Administered 2024-04-25: 1000 mL

## 2024-04-25 MED ORDER — CLOPIDOGREL BISULFATE 75 MG PO TABS
75.0000 mg | ORAL_TABLET | Freq: Every day | ORAL | Status: DC
Start: 2024-04-26 — End: 2024-04-25

## 2024-04-25 MED ORDER — IODIXANOL 320 MG/ML IV SOLN
INTRAVENOUS | Status: DC | PRN
  Administered 2024-04-25: 155 mL

## 2024-04-25 MED ORDER — HEPARIN SODIUM (PORCINE) 1000 UNIT/ML IJ SOLN
INTRAMUSCULAR | Status: DC | PRN
  Administered 2024-04-25: 8000 [IU] via INTRAVENOUS
  Administered 2024-04-25 (×2): 2000 [IU] via INTRAVENOUS

## 2024-04-25 MED ORDER — CLOPIDOGREL BISULFATE 75 MG PO TABS: 75.0000 mg | ORAL_TABLET | Freq: Every day | ORAL | 11 refills | Status: DC

## 2024-04-25 MED ORDER — CLOPIDOGREL BISULFATE 300 MG PO TABS
300.0000 mg | ORAL_TABLET | Freq: Once | ORAL | Status: AC
  Administered 2024-04-25: 300 mg via ORAL

## 2024-04-25 MED ORDER — LABETALOL HCL 5 MG/ML IV SOLN: 10.0000 mg | INTRAVENOUS | Status: DC | PRN

## 2024-04-25 MED ORDER — HEPARIN SODIUM (PORCINE) 1000 UNIT/ML IJ SOLN
INTRAMUSCULAR | Status: AC
  Filled 2024-04-25: qty 10

## 2024-04-25 MED ORDER — ASPIRIN 81 MG PO TBEC
81.0000 mg | DELAYED_RELEASE_TABLET | Freq: Every day | ORAL | Status: DC
  Administered 2024-04-25: 81 mg via ORAL
  Filled 2024-04-25: qty 1

## 2024-04-25 MED ORDER — ATORVASTATIN CALCIUM 40 MG PO TABS
40.0000 mg | ORAL_TABLET | Freq: Every day | ORAL | Status: DC
  Administered 2024-04-25: 40 mg via ORAL
  Filled 2024-04-25: qty 1

## 2024-04-25 MED ORDER — SODIUM CHLORIDE 0.9 % IV SOLN: INTRAVENOUS | Status: DC

## 2024-04-25 MED ORDER — HYDRALAZINE HCL 20 MG/ML IJ SOLN
INTRAMUSCULAR | Status: AC
  Filled 2024-04-25: qty 1

## 2024-04-25 MED ORDER — ATORVASTATIN CALCIUM 40 MG PO TABS: 40.0000 mg | ORAL_TABLET | Freq: Every day | ORAL | 11 refills | Status: AC

## 2024-04-25 SURGICAL SUPPLY — 20 items
BALLN STERLING OTW 2.5X220X150 (BALLOONS) IMPLANT
BALLOON MUSTANG 4X60X135 (BALLOONS) IMPLANT
CATH MUSTANG 3X80X135 (BALLOONS) IMPLANT
CATH OMNI FLUSH 5F 65CM (CATHETERS) IMPLANT
CATH QUICKCROSS .035X135CM (MICROCATHETER) IMPLANT
COVER DOME SNAP 22 D (MISCELLANEOUS) IMPLANT
DCB IN.PACT 4X80 (BALLOONS) IMPLANT
DEVICE CLOSURE MYNXGRIP 6/7F (Vascular Products) IMPLANT
GLIDEWIRE ADV .035X260CM (WIRE) IMPLANT
KIT ENCORE 26 ADVANTAGE (KITS) IMPLANT
KIT MICROPUNCTURE NIT STIFF (SHEATH) IMPLANT
KIT SINGLE USE MANIFOLD (KITS) IMPLANT
SET ATX-X65L (MISCELLANEOUS) IMPLANT
SHEATH CATAPULT 6FR 60 (SHEATH) IMPLANT
SHEATH PINNACLE 5F 10CM (SHEATH) IMPLANT
SHEATH PINNACLE 6F 10CM (SHEATH) IMPLANT
SHEATH PROBE COVER 6X72 (BAG) IMPLANT
TRAY PV CATH (CUSTOM PROCEDURE TRAY) ×2 IMPLANT
WIRE BENTSON .035X145CM (WIRE) IMPLANT
WIRE G V18X300CM (WIRE) IMPLANT

## 2024-04-25 NOTE — Op Note (Signed)
 Patient name: [name] [name] MRN: [id number] DOB: 24-Dec-1931 Sex: male  04/25/2024 Pre-operative Diagnosis: Left lower extremity critical limb ischemia with tissue loss of the great toe Post-operative diagnosis:  Same Surgeon:  Fonda FORBES Rim, MD Procedure Performed: 1.  Ultrasound-guided micropuncture access of the right common femoral artery in retrograde fashion 2.  Aortogram 3.  Second-order cannulation, left lower extremity angiogram Third order cannulation, left lower extremity angiogram 4.  Drug-coated balloon angioplasty left tibioperoneal trunk 4 x 80 mm balloon 5.  Balloon angioplasty left posterior tibial artery proximal 3 x 60, distal 2.5 x 220 6.  Balloon angioplasty left peroneal artery 3 x 60 7.  Device assisted closure-Mynx 8.  No sedation, contrast 105 mL   Indications: Patient is a 89 year old male with 40-month history of nonhealing wounds on the left great toe.  He had nonpalpable pulses in the foot with depressed ABIs.  After discussing risk and benefits of left lower extremity angiogram and after finding improve distal perfusion for wound healing, he elected to proceed.  Findings: Infrarenal aorta widely patent. widely patent aortoiliac segments bilaterally.  On the left, widely patent common femoral artery, profunda, superficial femoral artery, popliteal artery Severe disease of the tibioperoneal trunk with multiple lesions measuring greater than 80% stenosis.  Greater than 80% stenosis at the ostia of the peroneal artery, posterior tibial artery.  Focal disease in the posterior tibial artery at the mid tibia greater than 80% stenosis. Anterior tibial artery occluded after 3 cm with the dorsalis pedis reconstituting from peroneal collaterals.  Runoff in the foot predominantly through the posterior tibial artery.   Procedure:  The patient was identified in the holding area and taken to room 8.  The patient was then placed supine on the table and prepped and draped  in the usual sterile fashion.  A time out was called.  Ultrasound was used to evaluate the right common femoral artery.  It was patent .  A digital ultrasound image was acquired.  A micropuncture needle was used to access the right common femoral artery under ultrasound guidance.  An 018 wire was advanced without resistance and a micropuncture sheath was placed.  The 018 wire was removed and a benson wire was placed.  The micropuncture sheath was exchanged for a 5 french sheath.  An omniflush catheter was advanced over the wire to the level of L-1.  An abdominal angiogram was obtained.  Next, using the omniflush catheter and a benson wire, the aortic bifurcation was crossed and the catheter was placed into theleft external iliac artery and left runoff was obtained.    I elected to attempt intervention.  A 6 x 60 cm sheath was parked in the left superficial femoral artery.  In this location, multiple angiography runs followed of the left lower extremity. A series of wires and catheters were used to traverse the tibioperoneal trunk, and I initially placed a wire into the peroneal artery.  Next, a 3 x 60 mm balloon was brought to the field and expanded along the proximal peroneal artery and tibioperoneal trunk.  Follow-up angiography demonstrated significant improvement in the ostial lesion with no residual stenosis, however the tibioperoneal trunk continued to demonstrate severe disease.  I did not back to the wire up, and cannulated the posterior tibial artery.  The same 3 x 60 mm balloon was used at the ostial lesion of the posterior tibial artery.  Next, the balloon was exchanged for a 4 x 80 mm balloon which was used  to angioplasty the tibioperoneal trunk.  I was happy with this result, however there was some residual stenosis so I elected to reangioplasty using a 4 x 80 mm drug-coated balloon.  This demonstrated excellent result with resolution of flow-limiting stenosis.  Next, I moved to the mid posterior  tibial artery lesion.  This was greater than 80%, a 0.018 wire was used to cross the lesion and a 2.5 x 220 mm balloon was used to balloon angioplasty.  Final angiography demonstrate Result resolution of lumen stenosis.  At case completion, the patient had two-vessel outflow to the foot via the peroneal and posterior tibial artery. The patient was closed use a minx device without issue. Patient had a palpable posterior tibial pulse at case completion     [name] [name] E [name] Walthour MD [facility] [facility] Office: [phone]

## 2024-04-25 NOTE — Progress Notes (Signed)
 Called [facility] multiple times to give report to a nurse and no nurses answered their phones. I spoke with the director and she said a nurse would call and took my name and number and no one called. Patient was discharged with discharge paperwork, medications were written on paperwork of what I had given him.

## 2024-04-25 NOTE — H&P (Addendum)
 Office Note     Patient seen and examined in preop holding.  No complaints. No changes to medication history or physical exam since last seen in clinic. After discussing the risks and benefits of left lower extremity angiogram for critical limb ischemia with tissue loss, [name] [name] elected to proceed.  I have called several numbers in the chart including his wife, nephew, and another name, who is the driver who brought him today.  I have been unable to get in touch with the sons or family.  I will try again after the case.  _________________  I was able to use the patient's phone and called his son, [name] [name].  We discussed the case at length, as well as the indication.  After discussing the risks and benefits, he was comfortable proceeding.  He resided in [place] , and therefore I will call him after the case. Number has been added to the chart.   [name] [name]   CC:  Left foot abscess with poor wound healing  Requesting Provider:  No ref. provider found  HPI: [name] [name] is a 89 y.o. (October 31, 1932) male presenting at the request of .[name] Woloszyn Francisco, [name] for left great toe wound.  On exam, [name] was doing well, accompanied by his aide.  He currently resides in assisted living with his wife.  He has 2 children, both of which live out of town.  A native of [place] , he moved to [place]  years ago. He denies symptoms of claudication, ischemic rest pain.  He stated the toe wound has been present for over 2 months.  He denies significant drainage, erythema, induration.  Denies fevers, chills.    Past Medical History:  Diagnosis Date   B12 deficiency    Chronic heart failure with preserved ejection fraction (HFpEF) (HCC)    Falls    Hypertension    Hypothyroidism    Iron deficiency anemia    Peripheral vascular disease (HCC)    Prostate cancer (HCC)    Trifascicular block     Past Surgical History:  Procedure Laterality Date   HIP SURGERY      Social  History   Socioeconomic History   Marital status: Married    Spouse name: Not on file   Number of children: 3   Years of education: Not on file   Highest education level: Not on file  Occupational History   Occupation: retired   Tobacco Use   Smoking status: Former    Types: Cigarettes   Smokeless tobacco: Never  Vaping Use   Vaping status: Never Used  Substance and Sexual Activity   Alcohol use: Not Currently    Comment: Stopped 30 years ago   Drug use: Never   Sexual activity: Not on file  Other Topics Concern   Not on file  Social History Narrative   Not on file   Social Drivers of Health   Financial Resource Strain: Not on file  Food Insecurity: Unknown (08/13/2023)   Hunger Vital Sign    Worried About Running Out of Food in the Last Year: Patient declined    Ran Out of Food in the Last Year: Never true  Transportation Needs: No Transportation Needs (08/13/2023)   PRAPARE - Administrator, Civil Service (Medical): No    Lack of Transportation (Non-Medical): No  Physical Activity: Not on file  Stress: Not on file  Social Connections: Not on file  Intimate Partner Violence: Not At Risk (08/13/2023)  Humiliation, Afraid, Rape, and Kick questionnaire    Fear of Current or Ex-Partner: No    Emotionally Abused: No    Physically Abused: No    Sexually Abused: No   Family History  Problem Relation Age of Onset   Heart disease Mother    Throat cancer Mother    Hypertension Father    Prostate cancer Brother    Prostate cancer Nephew     Current Facility-Administered Medications  Medication Dose Route Frequency Provider Last Rate Last Admin   0.9 %  sodium chloride  infusion   Intravenous Continuous [name] [name] BRAVO, [name]        Allergies  Allergen Reactions   Sildenafil Other (See Comments)    Other reaction(s): Blurring of visual image     REVIEW OF SYSTEMS:  [X]  denotes positive finding, [ ]  denotes negative finding Cardiac  Comments:  Chest  pain or chest pressure:    Shortness of breath upon exertion:    Short of breath when lying flat:    Irregular heart rhythm:        Vascular    Pain in calf, thigh, or hip brought on by ambulation:    Pain in feet at night that wakes you up from your sleep:     Blood clot in your veins:    Leg swelling:         Pulmonary    Oxygen at home:    Productive cough:     Wheezing:         Neurologic    Sudden weakness in arms or legs:     Sudden numbness in arms or legs:     Sudden onset of difficulty speaking or slurred speech:    Temporary loss of vision in one eye:     Problems with dizziness:         Gastrointestinal    Blood in stool:     Vomited blood:         Genitourinary    Burning when urinating:     Blood in urine:        Psychiatric    Major depression:         Hematologic    Bleeding problems:    Problems with blood clotting too easily:        Skin    Rashes or ulcers:        Constitutional    Fever or chills:      PHYSICAL EXAMINATION:  Vitals:   04/25/24 0821  BP: (!) 163/99  Pulse: 77  Resp: 18  Temp: 97.6 F (36.4 C)  TempSrc: Oral  SpO2: 94%  Weight: 129.3 kg  Height: 6' 4 (1.93 m)    General:  WDWN in NAD; vital signs documented above Gait: Not observed HENT: WNL, normocephalic Pulmonary: normal non-labored breathing , without wheezing Cardiac: regular HR Abdomen: soft, NT, no masses Skin: without rashes Vascular Exam/Pulses:  Right Left  Radial 2+ (normal) 2+ (normal)  Ulnar    Femoral    Popliteal    DP 1+ (weak) 2+ (normal)  PT     Extremities: with ischemic changes, without Gangrene , without cellulitis; with open wounds;  Musculoskeletal: no muscle wasting or atrophy  Neurologic: A&O X 3;  No focal weakness or paresthesias are detected Psychiatric:  The pt has Normal affect.   Non-Invasive Vascular Imaging:   ABI Findings:  +---------+------------------+-----+-----------+--------+  Right   Rt Pressure  (mmHg)IndexWaveform   Comment   +---------+------------------+-----+-----------+--------+  Brachial 166                                         +---------+------------------+-----+-----------+--------+  PTA     148               0.89 multiphasic          +---------+------------------+-----+-----------+--------+  DP      153               0.92 biphasic             +---------+------------------+-----+-----------+--------+  Great Toe74                0.45 Abnormal             +---------+------------------+-----+-----------+--------+   +---------+------------------+-----+----------+-------+  Left    Lt Pressure (mmHg)IndexWaveform  Comment  +---------+------------------+-----+----------+-------+  Brachial 163                                       +---------+------------------+-----+----------+-------+  PTA     139               0.84 monophasic         +---------+------------------+-----+----------+-------+  PERO    144               0.87 monophasic         +---------+------------------+-----+----------+-------+  DP      123               0.74 monophasic         +---------+------------------+-----+----------+-------+  Great Toe129               0.78 Normal             +---------+------------------+-----+----------+-------+   +-------+-----------+-----------+------------+------------+  ABI/TBIToday's ABIToday's TBIPrevious ABIPrevious TBI  +-------+-----------+-----------+------------+------------+  Right 0.92       0.45                                 +-------+-----------+-----------+------------+------------+  Left  0.87       0.78                                 +-------+-----------+-----------+------------+------------+     ASSESSMENT/PLAN: ARNELL SLIVINSKI is a 89 y.o. male presenting with nonhealing left great toe wound.  Imaging was reviewed, the patient has falsely elevated ABIs and toe pressure with  monophasic waveforms throughout the left at the left ankle. On physical exam, he had a palpable dorsalis pedis pulse.  I had a long discussion with her regarding the above.  The wound has been present for over 2 months, and has had very poor wound healing.  He has not seen a podiatrist regarding the toe wound, and has been performing simple wound care.  After discussing the risks and benefits of left lower extremity angiogram for critical limb ischemia with tissue loss of the great toe that has not healed over the course of 2 months, Elihu elected to proceed. I have also referred him to podiatry for wound management.  I will call his son to update him regarding the above.    [name] FORBES Rim, [name] [facility] [phone]  Total time of patient care including pre-visit research, consultation, and documentation greater than 45 minutes

## 2024-04-26 ENCOUNTER — Telehealth: Payer: Self-pay

## 2024-04-26 ENCOUNTER — Encounter (HOSPITAL_COMMUNITY): Payer: Self-pay | Admitting: Vascular Surgery

## 2024-04-26 NOTE — Telephone Encounter (Signed)
-----   Message from Jerona LULLA Sage sent at 04/26/2024  7:45 AM EDT ----- We got it.  Thank you, we will call patient to schedule follow-up. ----- Message ----- From: Lanis Fonda BRAVO, MD Sent: 04/26/2024   7:25 AM EDT To: Jerona Sage LULLA, MD; [facility] Admin Pool  Needs orthopedic referral to Dr. Sage  for left leg  - concern for lytic bone lesions in the femur and tibia  Will need femur and tibial xrays on the left leg

## 2024-04-26 NOTE — Telephone Encounter (Signed)
 Called SNF again and was transferred to the transportation department. LM ON VM to advise tht this pt needs an appt for Monday. He was referred by his vascular surgeon after a procedure yesterday and would like to make that appt with Dr. Harden. To call back so that we can sch.

## 2024-04-26 NOTE — Telephone Encounter (Signed)
 Pt is a resident of [facility] called and was transferred to a vm and the mailbox was full. I called again and advised the front desk and was was transferred to Joen Dollar and this vm was also full. I will hold message and try again.

## 2024-04-27 NOTE — Telephone Encounter (Signed)
 Jame called this morning and pt is sch for Monday at 1pm with Dr. Duda

## 2024-04-30 ENCOUNTER — Ambulatory Visit (INDEPENDENT_AMBULATORY_CARE_PROVIDER_SITE_OTHER): Admitting: Orthopedic Surgery

## 2024-04-30 ENCOUNTER — Encounter: Payer: Self-pay | Admitting: Orthopedic Surgery

## 2024-04-30 ENCOUNTER — Encounter: Payer: Self-pay | Admitting: Physician Assistant

## 2024-04-30 DIAGNOSIS — I739 Peripheral vascular disease, unspecified: Secondary | ICD-10-CM

## 2024-04-30 DIAGNOSIS — L97521 Non-pressure chronic ulcer of other part of left foot limited to breakdown of skin: Secondary | ICD-10-CM

## 2024-04-30 NOTE — Progress Notes (Signed)
 Office Visit Note   Patient: [name] [name]           Date of Birth: 1931/12/26           MRN: [id number] Visit Date: 04/30/2024              Requested by: Joshua Francisco, MD [address] [place],  [place] [address] PCP: Joshua Francisco, MD  Chief Complaint  Patient presents with   Left Foot - Pain      HPI: Patient is a 89 year old gentleman who is seen for initial evaluation for left foot ulceration.  Patient is status post revascularization with Dr. Silver for critical limb ischemia of the left lower extremity 2 months ago.  Patient underwent balloon angioplasty on June 25.  Patient is currently on Plavix .  Assessment & Plan: Visit Diagnoses:  1. PAD (peripheral artery disease) (HCC)   2. Skin ulcer of left great toe, limited to breakdown of skin (HCC)     Plan: Ulcer was debrided there is healthy granulation tissue.  Will reevaluate in 4 weeks.  Anticipate this should heal in the interim.  Follow-Up Instructions: Return in about 4 weeks (around 05/28/2024).   Ortho Exam  Patient is alert, oriented, no adenopathy, well-dressed, normal affect, normal respiratory effort. Examination patient has a strong palpable dorsalis pedis pulse on the left.  He has a weaker palpable dorsalis pedis pulse on the right.  Patient has a large Wagner grade 1 ulcer on the plantar aspect of the left great toe.  After informed consent a 10 blade knife was used to debride the skin and soft tissue back to healthy viable bleeding granulation tissue.  This was touched with silver nitrate.  After debridement the ulcer is 2 cm diameter.  A Band-Aid was applied.    Imaging: No results found. No images are attached to the encounter.  Labs: Lab Results  Component Value Date   HGBA1C 5.5 08/13/2023   HGBA1C 5.7 08/25/2009   HGBA1C 6.6 (H) 09/05/2007   ESRSEDRATE 10 08/25/2009   REPTSTATUS 11/22/2022 FINAL 11/17/2022   CULT  11/17/2022    NO GROWTH 5 DAYS Performed at [facility], [address] [place], [place] [address]    LABORGA STAPHYLOCOCCUS LUGDUNENSIS (A) 11/17/2022     Lab Results  Component Value Date   ALBUMIN 3.0 (L) 08/17/2023   ALBUMIN 3.1 (L) 08/16/2023   ALBUMIN 3.2 (L) 08/15/2023    Lab Results  Component Value Date   MG 1.9 08/17/2023   MG 1.8 08/16/2023   MG 1.9 08/15/2023   No results found for: VD25OH  No results found for: PREALBUMIN    Latest Ref Rng & Units 04/25/2024    8:29 AM 08/17/2023    7:00 AM 08/16/2023    6:36 AM  CBC EXTENDED  WBC 4.0 - 10.5 K/uL  4.5  3.7   RBC 4.22 - 5.81 MIL/uL  5.40  5.56   Hemoglobin 13.0 - 17.0 g/dL 83.9  83.8  83.3   HCT 39.0 - 52.0 % 47.0  46.1  47.6   Platelets 150 - 400 K/uL  213  192      There is no height or weight on file to calculate BMI.  Orders:  No orders of the defined types were placed in this encounter.  No orders of the defined types were placed in this encounter.    Procedures: No procedures performed  Clinical Data: No additional findings.  ROS:  All other systems  negative, except as noted in the HPI. Review of Systems  Objective: Vital Signs: There were no vitals taken for this visit.  Specialty Comments:  No specialty comments available.  PMFS History: Patient Active Problem List   Diagnosis Date Noted   Ground-level fall 08/12/2023   Falls frequently 11/17/2022   Atrial fibrillation (HCC) 11/17/2022   COVID-19 virus infection 11/17/2022   B12 deficiency 03/10/2021   DIZZINESS 08/28/2009   SHOULDER PAIN, LEFT 08/25/2009   FATIGUE 08/25/2009   Type 2 diabetes mellitus without complication, without long-term current use of insulin (HCC) 03/05/2008   HYPOGONADISM, MALE 03/05/2008   CONDUCTIVE HEARING LOSS BILATERAL 03/05/2008   Allergic rhinitis 03/05/2008   NEOPLASM, MALIGNANT, PROSTATE 09/05/2007   Hypothyroidism 09/05/2007   HYPERLIPIDEMIA 09/05/2007   ANEMIA-IRON DEFICIENCY 09/05/2007   Anxiety state 09/05/2007   ERECTILE DYSFUNCTION  09/05/2007   DEPRESSION 09/05/2007   Migraine without aura 09/05/2007   Essential hypertension 09/05/2007   BRADYCARDIA, CHRONIC 09/05/2007   GERD 09/05/2007   SWELLING MASS OR LUMP IN HEAD AND NECK 09/05/2007   GLYCOSURIA 09/05/2007   DECREASED LIBIDO 09/05/2007   GLUCOSE INTOLERANCE, HX OF 09/05/2007   History of cardiovascular disorder 09/05/2007   VOCAL CORD POLYP, HX OF 09/05/2007   Past Medical History:  Diagnosis Date   B12 deficiency    Chronic heart failure with preserved ejection fraction (HFpEF) (HCC)    Falls    Hypertension    Hypothyroidism    Iron deficiency anemia    Peripheral vascular disease (HCC)    Prostate cancer (HCC)    Trifascicular block     Family History  Problem Relation Age of Onset   Heart disease Mother    Throat cancer Mother    Hypertension Father    Prostate cancer Brother    Prostate cancer Nephew     Past Surgical History:  Procedure Laterality Date   ABDOMINAL AORTOGRAM N/A 04/25/2024   Procedure: ABDOMINAL AORTOGRAM;  Surgeon: Lanis Fonda BRAVO, MD;  Location: [facility] INVASIVE CV LAB;  Service: Cardiovascular;  Laterality: N/A;   HIP SURGERY     LOWER EXTREMITY ANGIOGRAPHY N/A 04/25/2024   Procedure: Lower Extremity Angiography;  Surgeon: Lanis Fonda BRAVO, MD;  Location: [facility] INVASIVE CV LAB;  Service: Cardiovascular;  Laterality: N/A;   LOWER EXTREMITY INTERVENTION N/A 04/25/2024   Procedure: LOWER EXTREMITY INTERVENTION;  Surgeon: Lanis Fonda BRAVO, MD;  Location: [facility] INVASIVE CV LAB;  Service: Cardiovascular;  Laterality: N/A;   Social History   Occupational History   Occupation: retired   Tobacco Use   Smoking status: Former    Types: Cigarettes   Smokeless tobacco: Never  Vaping Use   Vaping status: Never Used  Substance and Sexual Activity   Alcohol use: Not Currently    Comment: Stopped 30 years ago   Drug use: Never   Sexual activity: Not on file

## 2024-05-01 ENCOUNTER — Encounter: Payer: Self-pay | Admitting: Physician Assistant

## 2024-05-02 ENCOUNTER — Encounter: Payer: Self-pay | Admitting: Podiatry

## 2024-05-02 ENCOUNTER — Ambulatory Visit (INDEPENDENT_AMBULATORY_CARE_PROVIDER_SITE_OTHER): Admitting: Podiatry

## 2024-05-02 DIAGNOSIS — I739 Peripheral vascular disease, unspecified: Secondary | ICD-10-CM | POA: Diagnosis not present

## 2024-05-02 DIAGNOSIS — L97522 Non-pressure chronic ulcer of other part of left foot with fat layer exposed: Secondary | ICD-10-CM

## 2024-05-02 NOTE — Progress Notes (Signed)
  Subjective:  Patient ID: [name] [name], male    DOB: April 27, 1932,   MRN: [id number]  Chief Complaint  Patient presents with   Toe Pain    Hallux left - wound plantar tip of toe x 5-6 months, stays in nursing facility and they have been dressing it daily, did have vascular studies done, Dr. Harden did eval-trim and told to wrap daily, worsening, more painful   New Patient (Initial Visit)    89 y.o. male presents for concern of left hallux wound that has been present for about 5-6 months. He is currently at a facility where they have been dressing it. Was referred her by Dr. Silver following revascularization to have wound care done. He has seen Dr. Harden and has advised on daily dressings.  . Denies any other pedal complaints. Denies diabetes. Denies n/v/f/c.   Past Medical History:  Diagnosis Date   B12 deficiency    Chronic heart failure with preserved ejection fraction (HFpEF) (HCC)    Falls    Hypertension    Hypothyroidism    Iron deficiency anemia    Peripheral vascular disease (HCC)    Prostate cancer (HCC)    Trifascicular block     Objective:  Physical Exam: Vascular: DP/PT pulses 2/4 bilateral. CFT <3 seconds. Normal hair growth on digits. No edema.  Skin. No lacerations or abrasions bilateral feet. Left hallux plantar distal wound with hyperkeratosis surroudning and granular base. No erythema edema or purulence noted. Measurements below. No probe to bone.  Musculoskeletal: MMT 5/5 bilateral lower extremities in DF, PF, Inversion and Eversion. Deceased ROM in DF of ankle joint.  Neurological: Sensation intact to light touch.   Assessment:   1. Skin ulcer of left great toe with fat layer exposed (HCC)   2. PAD (peripheral artery disease) (HCC)      Plan:  Patient was evaluated and treated and all questions answered. Ulcer plantar left hallux with fat layer exposed 3 -Debridement as below. -Dressed with betadine, DSD. -Off-loading with surgical shoe. -No abx  indicated.  -Discussed glucose control and proper protein-rich diet.  -Discussed if any worsening redness, pain, fever or chills to call or may need to report to the emergency room. Patient expressed understanding.   Procedure: Excisional Debridement of Wound Rationale: Removal of non-viable soft tissue from the wound to promote healing.  Anesthesia: none Pre-Debridement Wound Measurements: Overlying hyperkeratosis   Post-Debridement Wound Measurements: 0.7 cm x 0.5 cm x 0.2 cm  Type of Debridement: Sharp Excisional Tissue Removed: Non-viable soft tissue Depth of Debridement: subcutaneous tissue. Technique: Sharp excisional debridement to bleeding, viable wound base.  Dressing: Dry, sterile, compression dressing. Disposition: Patient tolerated procedure well. Patient to return in 2 week for follow-up.  Return in about 2 weeks (around 05/16/2024) for wound check.   [place]berry Failing, DPM

## 2024-05-03 ENCOUNTER — Encounter: Admitting: Orthopedic Surgery

## 2024-05-16 ENCOUNTER — Ambulatory Visit (INDEPENDENT_AMBULATORY_CARE_PROVIDER_SITE_OTHER): Admitting: Podiatry

## 2024-05-16 ENCOUNTER — Encounter: Payer: Self-pay | Admitting: Podiatry

## 2024-05-16 DIAGNOSIS — I739 Peripheral vascular disease, unspecified: Secondary | ICD-10-CM | POA: Diagnosis not present

## 2024-05-16 DIAGNOSIS — L97522 Non-pressure chronic ulcer of other part of left foot with fat layer exposed: Secondary | ICD-10-CM | POA: Diagnosis not present

## 2024-05-16 NOTE — Progress Notes (Signed)
  Subjective:  Patient ID: [name] [name], male    DOB: [birth year],   MRN: [id number]  Chief Complaint  Patient presents with   Foot Ulcer    Follow up ulcer hallux left - still complaining of pain when walking or standing, not bandaged today    89 y.o. male presents for follow-up of left hallux wound. Has been dressing as instructed. History of revascularization with Dr. Silver.   . Denies any other pedal complaints. Denies diabetes. Denies n/v/f/c.   Past Medical History:  Diagnosis Date   B12 deficiency    Chronic heart failure with preserved ejection fraction (HFpEF) (HCC)    Falls    Hypertension    Hypothyroidism    Iron deficiency anemia    Peripheral vascular disease (HCC)    Prostate cancer (HCC)    Trifascicular block     Objective:  Physical Exam: Vascular: DP/PT pulses 2/4 bilateral. CFT <3 seconds. Normal hair growth on digits. No edema.  Skin. No lacerations or abrasions bilateral feet. Left hallux plantar distal wound with hyperkeratosis surroudning and granular base. No erythema edema or purulence noted. Measurements below. No probe to bone.  Musculoskeletal: MMT 5/5 bilateral lower extremities in DF, PF, Inversion and Eversion. Deceased ROM in DF of ankle joint.  Neurological: Sensation intact to light touch.   Assessment:   1. Skin ulcer of left great toe with fat layer exposed (HCC)   2. PAD (peripheral artery disease) (HCC)       Plan:  Patient was evaluated and treated and all questions answered. Ulcer plantar left hallux with fat layer exposed 3 -Debridement as below. -Dressed with betadine, DSD. -Off-loading with surgical shoe. -No abx indicated.  -Discussed glucose control and proper protein-rich diet.  -Discussed if any worsening redness, pain, fever or chills to call or may need to report to the emergency room. Patient expressed understanding.   Procedure: Excisional Debridement of Wound Rationale: Removal of non-viable soft tissue from the  wound to promote healing.  Anesthesia: none Pre-Debridement Wound Measurements: Overlying hyperkeratosis   Post-Debridement Wound Measurements: 0.7 cm x 0.1 cm x 0.2 cm  Type of Debridement: Sharp Excisional Tissue Removed: Non-viable soft tissue Depth of Debridement: subcutaneous tissue. Technique: Sharp excisional debridement to bleeding, viable wound base.  Dressing: Dry, sterile, compression dressing. Disposition: Patient tolerated procedure well. Patient to return in 2 week for follow-up.  Return in about 2 weeks (around 05/30/2024) for wound check.   Asberry Failing, DPM

## 2024-05-25 ENCOUNTER — Emergency Department (HOSPITAL_COMMUNITY): Admission: EM | Admit: 2024-05-25 | Discharge: 2024-05-26 | Disposition: A | Discharge status: Home / Self Care

## 2024-05-25 DIAGNOSIS — R319 Hematuria, unspecified: Secondary | ICD-10-CM | POA: Diagnosis present

## 2024-05-25 DIAGNOSIS — R5383 Other fatigue: Secondary | ICD-10-CM | POA: Insufficient documentation

## 2024-05-25 DIAGNOSIS — Z7982 Long term (current) use of aspirin: Secondary | ICD-10-CM | POA: Insufficient documentation

## 2024-05-25 LAB — CBC WITH DIFFERENTIAL/PLATELET
Abs Immature Granulocytes: 0.01 K/uL (ref 0.00–0.07)
Basophils Absolute: 0 K/uL (ref 0.0–0.1)
Basophils Relative: 1 %
Eosinophils Absolute: 0.2 K/uL (ref 0.0–0.5)
Eosinophils Relative: 3 %
HCT: 43.2 % (ref 39.0–52.0)
Hemoglobin: 14.6 g/dL (ref 13.0–17.0)
Immature Granulocytes: 0 %
Lymphocytes Relative: 26 %
Lymphs Abs: 1.6 K/uL (ref 0.7–4.0)
MCH: 31.1 pg (ref 26.0–34.0)
MCHC: 33.8 g/dL (ref 30.0–36.0)
MCV: 91.9 fL (ref 80.0–100.0)
Monocytes Absolute: 0.8 K/uL (ref 0.1–1.0)
Monocytes Relative: 13 %
Neutro Abs: 3.6 K/uL (ref 1.7–7.7)
Neutrophils Relative %: 57 %
Platelets: 173 K/uL (ref 150–400)
RBC: 4.7 MIL/uL (ref 4.22–5.81)
RDW: 12.9 % (ref 11.5–15.5)
WBC: 6.3 K/uL (ref 4.0–10.5)
nRBC: 0 % (ref 0.0–0.2)

## 2024-05-25 LAB — URINALYSIS, ROUTINE W REFLEX MICROSCOPIC
Bacteria, UA: NONE SEEN
RBC / HPF: >50 RBC/hpf (ref 0–5)
WBC, UA: >50 WBC/hpf (ref 0–5)

## 2024-05-25 LAB — BASIC METABOLIC PANEL WITH GFR
Anion gap: 8 (ref 5–15)
BUN: 23 mg/dL (ref 8–23)
CO2: 25 mmol/L (ref 22–32)
Calcium: 8.7 mg/dL — ABNORMAL LOW (ref 8.9–10.3)
Chloride: 106 mmol/L (ref 98–111)
Creatinine, Ser: 1.09 mg/dL (ref 0.61–1.24)
GFR, Estimated: >60 mL/min (ref >60)
Glucose, Bld: 101 mg/dL — ABNORMAL HIGH (ref 70–99)
Potassium: 4 mmol/L (ref 3.5–5.1)
Sodium: 139 mmol/L (ref 135–145)

## 2024-05-25 NOTE — ED Triage Notes (Signed)
 Pt arrived via ems from [facility] on [address]. Hematuria, onset 2 days ago. Burn when urinates. 3 episode. Pt fell yesterday/mechanical fall. Aaox2 hx. Alzhemiers. Pt is on Aspirin  and Plavix . Full code per facility.   136/80 143 97% 20g L FA

## 2024-05-25 NOTE — ED Provider Notes (Signed)
 [facility] EMERGENCY DEPARTMENT AT [facility] Provider Note   CSN: [id number] Arrival date & time: 05/25/24  2154     Patient presents with: Hematuria   [name] [name] is a 89 y.o. male.   89 year old male presents for evaluation of hematuria.  States this started today.  States he has been fatigued as well.  States it does burn when he urinates.  Denies any other symptoms or concerns at this time.   Hematuria Pertinent negatives include no chest pain, no abdominal pain and no shortness of breath.       Prior to Admission medications   Medication Sig Start Date End Date Taking? Authorizing Provider  acetaminophen  (TYLENOL ) 500 MG tablet Take 500 mg by mouth every 6 (six) hours as needed.    [provider]  aspirin  EC 81 MG tablet Take 81 mg by mouth daily. Swallow whole.    [provider]  atorvastatin  (LIPITOR) 40 MG tablet Take 1 tablet (40 mg total) by mouth daily. 04/25/24 04/25/25  Robins, Joshua E, MD  brexpiprazole (REXULTI) 2 MG TABS tablet Take 2 mg by mouth at bedtime.    [provider]  Carboxymethylcellulose Sod PF 1 % GEL Apply 1 drop to eye See admin instructions. 2-4 times daily for dry eye. Patient not taking: No sig reported    [provider]  clopidogrel  (PLAVIX ) 75 MG tablet Take 1 tablet (75 mg total) by mouth daily. 04/25/24 04/25/25  Robins, Joshua E, MD  DULoxetine (CYMBALTA) 20 MG capsule Take 20 mg by mouth daily.    [provider]  ergocalciferol (VITAMIN D2) 1.25 MG (50000 UT) capsule Take 50,000 Units by mouth every Monday. Patient not taking: No sig reported    [provider]  levothyroxine  (SYNTHROID ) 125 MCG tablet Take 125 mcg by mouth daily before breakfast.    [provider]  lidocaine  4 % Place 1 patch onto the skin daily.    [provider]  lisinopril  (ZESTRIL ) 10 MG tablet Take 10 mg by mouth daily.    [provider]  melatonin 5 MG TABS Take 5 mg  by mouth at bedtime.    [provider]  polyethylene glycol (MIRALAX / GLYCOLAX) 17 g packet Take 17 g by mouth daily.    [provider]  tamsulosin  (FLOMAX ) 0.4 MG CAPS capsule Take 0.4 mg by mouth daily. 09/24/19   [provider]  tizanidine (ZANAFLEX) 2 MG capsule Take 2 mg by mouth 3 (three) times daily as needed for muscle spasms.    [provider]    Allergies: Sildenafil    Review of Systems  Constitutional:  Negative for chills and fever.  HENT:  Negative for ear pain and sore throat.   Eyes:  Negative for pain and visual disturbance.  Respiratory:  Negative for cough and shortness of breath.   Cardiovascular:  Negative for chest pain and palpitations.  Gastrointestinal:  Negative for abdominal pain and vomiting.  Genitourinary:  Positive for hematuria. Negative for dysuria.  Musculoskeletal:  Negative for arthralgias and back pain.  Skin:  Negative for color change and rash.  Neurological:  Negative for seizures and syncope.  All other systems reviewed and are negative.   Updated Vital Signs BP (!) 144/76 (BP Location: Left Arm)   Temp 98 F (36.7 C) (Oral)   Resp 16   SpO2 99%   Physical Exam Vitals and nursing note reviewed.  Constitutional:      General: He  is not in acute distress.    Appearance: He is well-developed.  HENT:     Head: Normocephalic and atraumatic.  Eyes:     Conjunctiva/sclera: Conjunctivae normal.  Cardiovascular:     Rate and Rhythm: Normal rate and regular rhythm.     Heart sounds: Normal heart sounds. No murmur heard.    No friction rub.  Pulmonary:     Effort: Pulmonary effort is normal. No respiratory distress.     Breath sounds: Normal breath sounds.  Abdominal:     General: Abdomen is flat. There is no distension.     Palpations: Abdomen is soft. There is no mass.     Tenderness: There is no abdominal tenderness.     Hernia: No hernia is present.  Musculoskeletal:        General: No  swelling.     Cervical back: Neck supple.  Skin:    General: Skin is warm and dry.     Capillary Refill: Capillary refill takes less than 2 seconds.  Neurological:     Mental Status: He is alert.  Psychiatric:        Mood and Affect: Mood normal.     (all labs ordered are listed, but only abnormal results are displayed) Labs Reviewed  URINALYSIS, ROUTINE W REFLEX MICROSCOPIC - Abnormal; Notable for the following components:      Result Value   Color, Urine RED (*)    APPearance TURBID (*)    Glucose, UA   (*)    Value: TEST NOT REPORTED DUE TO COLOR INTERFERENCE OF URINE PIGMENT   Hgb urine dipstick   (*)    Value: TEST NOT REPORTED DUE TO COLOR INTERFERENCE OF URINE PIGMENT   Bilirubin Urine   (*)    Value: TEST NOT REPORTED DUE TO COLOR INTERFERENCE OF URINE PIGMENT   Ketones, ur   (*)    Value: TEST NOT REPORTED DUE TO COLOR INTERFERENCE OF URINE PIGMENT   Protein, ur   (*)    Value: TEST NOT REPORTED DUE TO COLOR INTERFERENCE OF URINE PIGMENT   Nitrite   (*)    Value: TEST NOT REPORTED DUE TO COLOR INTERFERENCE OF URINE PIGMENT   Leukocytes,Ua   (*)    Value: TEST NOT REPORTED DUE TO COLOR INTERFERENCE OF URINE PIGMENT   All other components within normal limits  BASIC METABOLIC PANEL WITH GFR - Abnormal; Notable for the following components:   Glucose, Bld 101 (*)    Calcium  8.7 (*)    All other components within normal limits  CBC WITH DIFFERENTIAL/PLATELET    EKG: None  Radiology: No results found.   Procedures   Medications Ordered in the ED - No data to display                                  Medical Decision Making Patient here for hematuria.  Labs reviewed and unremarkable.  His vitals are stable and he appears well.  Hemoglobin is within normal limits he is not on a blood thinner.  No evidence of infection on his urine but does have mild hematuria.  Advised close follow-up with urology.  Advised otherwise return to the ER for any new or worsening  symptoms.  He feels comfortable with the plan to be discharged home  Problems Addressed: Hematuria, unspecified type: acute illness or injury  Amount and/or Complexity of Data Reviewed External Data Reviewed: notes.  Details: Outpatient records reviewed patient with history of prostate cancer in the past Labs: ordered. Decision-making details documented in ED Course.    Details: Patient's labs ordered and reviewed by me and he has some hematuria but no evidence of infection, hemoglobin is stable and labs are unremarkable otherwise  Risk OTC drugs. Prescription drug management.    Final diagnoses:  Hematuria, unspecified type    ED Discharge Orders     None          Gennaro Duwaine CROME, DO 05/25/24 2353

## 2024-05-25 NOTE — Discharge Instructions (Signed)
 Call and follow-up with urology.  The number is listed below.  Return to the ER for any new or worsening symptoms.

## 2024-05-26 NOTE — ED Notes (Signed)
 Nurse attempted to call [facility] to give report, no answer. [facility] has been contacted

## 2024-05-26 NOTE — ED Notes (Signed)
 Pt cleaned from urine saturated brief that shows clear signs of hematuria. Pt tolerated well, denies any complaints at this time.

## 2024-05-30 ENCOUNTER — Ambulatory Visit (INDEPENDENT_AMBULATORY_CARE_PROVIDER_SITE_OTHER): Admitting: Podiatry

## 2024-05-30 ENCOUNTER — Encounter: Payer: Self-pay | Admitting: Podiatry

## 2024-05-30 DIAGNOSIS — I739 Peripheral vascular disease, unspecified: Secondary | ICD-10-CM | POA: Diagnosis not present

## 2024-05-30 DIAGNOSIS — L97522 Non-pressure chronic ulcer of other part of left foot with fat layer exposed: Secondary | ICD-10-CM

## 2024-05-30 NOTE — Progress Notes (Signed)
  Subjective:  Patient ID: [name] [name], male    DOB: 04/09/1932,   MRN: [id number]  Chief Complaint  Patient presents with   Wound Check    It pains to walk on it.    89 y.o. male presents for follow-up of left hallux wound. Has been dressing as instructed. History of revascularization with Dr. Silver.   . Denies any other pedal complaints. Denies diabetes. Denies n/v/f/c.   Past Medical History:  Diagnosis Date   B12 deficiency    Chronic heart failure with preserved ejection fraction (HFpEF) (HCC)    Falls    Hypertension    Hypothyroidism    Iron deficiency anemia    Peripheral vascular disease (HCC)    Prostate cancer (HCC)    Trifascicular block     Objective:  Physical Exam: Vascular: DP/PT pulses 2/4 bilateral. CFT <3 seconds. Normal hair growth on digits. No edema.  Skin. No lacerations or abrasions bilateral feet. Left hallux plantar distal wound with hyperkeratosis surroudning and granular base. No erythema edema or purulence noted. Measurements below. No probe to bone.  Musculoskeletal: MMT 5/5 bilateral lower extremities in DF, PF, Inversion and Eversion. Deceased ROM in DF of ankle joint.  Neurological: Sensation intact to light touch.   Assessment:   1. Skin ulcer of left great toe with fat layer exposed (HCC)   2. PAD (peripheral artery disease) (HCC)        Plan:  Patient was evaluated and treated and all questions answered. Ulcer plantar left hallux with fat layer exposed   -Debridement as below. -Dressed with betadine, DSD. -Off-loading with surgical shoe. -No abx indicated.  -Discussed glucose control and proper protein-rich diet.  -Discussed if any worsening redness, pain, fever or chills to call or may need to report to the emergency room. Patient expressed understanding.   Procedure: Excisional Debridement of Wound Rationale: Removal of non-viable soft tissue from the wound to promote healing.  Anesthesia: none Pre-Debridement Wound  Measurements: Overlying hyperkeratosis   Post-Debridement Wound Measurements: 0.6 cm x 0.1 cm x 0.2 cm  Type of Debridement: Sharp Excisional Tissue Removed: Non-viable soft tissue Depth of Debridement: subcutaneous tissue. Technique: Sharp excisional debridement to bleeding, viable wound base.  Dressing: Dry, sterile, compression dressing. Disposition: Patient tolerated procedure well. Patient to return in 2 week for follow-up.  Return in about 2 weeks (around 06/13/2024) for wound check.   [name] [name], DPM

## 2024-06-13 ENCOUNTER — Ambulatory Visit: Admitting: Podiatry

## 2024-06-13 ENCOUNTER — Encounter: Payer: Self-pay | Admitting: Podiatry

## 2024-06-13 VITALS — BP 109/54 | HR 75 | Temp 96.6°F

## 2024-06-13 DIAGNOSIS — I739 Peripheral vascular disease, unspecified: Secondary | ICD-10-CM | POA: Diagnosis not present

## 2024-06-13 DIAGNOSIS — L97522 Non-pressure chronic ulcer of other part of left foot with fat layer exposed: Secondary | ICD-10-CM

## 2024-06-13 NOTE — Progress Notes (Signed)
  Subjective:  Patient ID: [name] [name], male    DOB: [birth year],   MRN: [id number]  Chief Complaint  Patient presents with   Wound Check    It's not good.  There's a lot of soreness.    89 y.o. male presents for follow-up of left hallux wound. Relates pain and not doing well.  Has been dressing as instructed. History of revascularization with Dr. Silver.   . Denies any other pedal complaints. Denies diabetes. Denies n/v/f/c.   Past Medical History:  Diagnosis Date   B12 deficiency    Chronic heart failure with preserved ejection fraction (HFpEF) (HCC)    Falls    Hypertension    Hypothyroidism    Iron deficiency anemia    Peripheral vascular disease (HCC)    Prostate cancer (HCC)    Trifascicular block     Objective:  Physical Exam: Vascular: DP/PT pulses 2/4 bilateral. CFT <3 seconds. Normal hair growth on digits. No edema.  Skin. No lacerations or abrasions bilateral feet. Left hallux plantar distal wound with hyperkeratosis surroudning and granular base. No erythema edema or purulence noted. Measurements below. No probe to bone.  Musculoskeletal: MMT 5/5 bilateral lower extremities in DF, PF, Inversion and Eversion. Deceased ROM in DF of ankle joint.  Neurological: Sensation intact to light touch.   Assessment:   1. Skin ulcer of left great toe with fat layer exposed (HCC)   2. PAD (peripheral artery disease) (HCC)         Plan:  Patient was evaluated and treated and all questions answered. Ulcer plantar left hallux with fat layer exposed   -Debridement as below. -Dressed with betadine, DSD. -Off-loading with surgical shoe. -No abx indicated.  -Discussed glucose control and proper protein-rich diet.  -Discussed if any worsening redness, pain, fever or chills to call or may need to report to the emergency room. Patient expressed understanding.   Procedure: Excisional Debridement of Wound Rationale: Removal of non-viable soft tissue from the wound to promote  healing.  Anesthesia: none Pre-Debridement Wound Measurements: Overlying hyperkeratosis   Post-Debridement Wound Measurements: 0.4 cm x 0.3 cm x 0.2 cm  Type of Debridement: Sharp Excisional Tissue Removed: Non-viable soft tissue Depth of Debridement: subcutaneous tissue. Technique: Sharp excisional debridement to bleeding, viable wound base.  Dressing: Dry, sterile, compression dressing. Disposition: Patient tolerated procedure well. Patient to return in 2 week for follow-up.  Return in about 2 weeks (around 06/27/2024) for wound check.   Asberry Failing, DPM

## 2024-06-27 ENCOUNTER — Ambulatory Visit (INDEPENDENT_AMBULATORY_CARE_PROVIDER_SITE_OTHER): Admitting: Podiatry

## 2024-06-27 ENCOUNTER — Encounter: Payer: Self-pay | Admitting: Podiatry

## 2024-06-27 VITALS — BP 126/71 | HR 69

## 2024-06-27 DIAGNOSIS — L97522 Non-pressure chronic ulcer of other part of left foot with fat layer exposed: Secondary | ICD-10-CM

## 2024-06-27 DIAGNOSIS — I739 Peripheral vascular disease, unspecified: Secondary | ICD-10-CM

## 2024-06-27 NOTE — Progress Notes (Signed)
  Subjective:  Patient ID: [name] [name], male    DOB: 09/18/1932,   MRN: [id number]  Chief Complaint  Patient presents with   Diabetic Ulcer    It's tingling a lot.  A1c - ?    89 y.o. male presents for follow-up of left hallux wound. Relates pain and not doing well.  Has been dressing as instructed. History of revascularization with Dr. Silver.   . Denies any other pedal complaints. Denies diabetes. Denies n/v/f/c.   Past Medical History:  Diagnosis Date   B12 deficiency    Chronic heart failure with preserved ejection fraction (HFpEF) (HCC)    Falls    Hypertension    Hypothyroidism    Iron deficiency anemia    Peripheral vascular disease (HCC)    Prostate cancer (HCC)    Trifascicular block     Objective:  Physical Exam: Vascular: DP/PT pulses 2/4 bilateral. CFT <3 seconds. Normal hair growth on digits. No edema.  Skin. No lacerations or abrasions bilateral feet. Left hallux plantar distal wound with hyperkeratosis surroudning and granular base. No erythema edema or purulence noted. Measurements below. No probe to bone.  Musculoskeletal: MMT 5/5 bilateral lower extremities in DF, PF, Inversion and Eversion. Deceased ROM in DF of ankle joint.  Neurological: Sensation intact to light touch.   Assessment:   1. Skin ulcer of left great toe with fat layer exposed (HCC)   2. PAD (peripheral artery disease) (HCC)          Plan:  Patient was evaluated and treated and all questions answered. Ulcer plantar left hallux with fat layer exposed   -Debridement as below. -Dressed with betadine, DSD. -Off-loading with surgical shoe. -No abx indicated.  -Discussed glucose control and proper protein-rich diet.  -Discussed if any worsening redness, pain, fever or chills to call or may need to report to the emergency room. Patient expressed understanding.   Procedure: Excisional Debridement of Wound Rationale: Removal of non-viable soft tissue from the wound to promote healing.   Anesthesia: none Pre-Debridement Wound Measurements: Overlying hyperkeratosis   Post-Debridement Wound Measurements: 0.4 cm x 0.2 cm x 0.2 cm  Type of Debridement: Sharp Excisional Tissue Removed: Non-viable soft tissue Depth of Debridement: subcutaneous tissue. Technique: Sharp excisional debridement to bleeding, viable wound base.  Dressing: Dry, sterile, compression dressing. Disposition: Patient tolerated procedure well. Patient to return in 2 week for follow-up.  Return in about 2 weeks (around 07/11/2024) for wound check.   Asberry Failing, DPM

## 2024-07-11 ENCOUNTER — Ambulatory Visit (INDEPENDENT_AMBULATORY_CARE_PROVIDER_SITE_OTHER): Admitting: Podiatry

## 2024-07-11 VITALS — BP 116/67 | HR 68 | Temp 97.2°F

## 2024-07-11 DIAGNOSIS — L97522 Non-pressure chronic ulcer of other part of left foot with fat layer exposed: Secondary | ICD-10-CM | POA: Diagnosis not present

## 2024-07-11 DIAGNOSIS — I739 Peripheral vascular disease, unspecified: Secondary | ICD-10-CM | POA: Diagnosis not present

## 2024-07-11 NOTE — Progress Notes (Signed)
 Subjective:  Patient ID: [name] [name], male    DOB: 1932/08/28,   MRN: [id number]  Chief Complaint  Patient presents with   Wound Check    It's throbbing.    89 y.o. male presents for follow-up of left hallux wound. Relates pain and not doing well.  Has been dressing as instructed. History of revascularization with Dr. Silver.   . Denies any other pedal complaints. Denies diabetes. Denies n/v/f/c.   Past Medical History:  Diagnosis Date   B12 deficiency    Chronic heart failure with preserved ejection fraction (HFpEF) (HCC)    Falls    Hypertension    Hypothyroidism    Iron deficiency anemia    Peripheral vascular disease (HCC)    Prostate cancer (HCC)    Trifascicular block     Objective:  Physical Exam: Vascular: DP/PT pulses 2/4 bilateral. CFT <3 seconds. Normal hair growth on digits. No edema.  Skin. No lacerations or abrasions bilateral feet. Left hallux plantar distal wound with hyperkeratosis surroudning and granular base. No erythema edema or purulence noted. Measurements below. No probe to bone.  Musculoskeletal: MMT 5/5 bilateral lower extremities in DF, PF, Inversion and Eversion. Deceased ROM in DF of ankle joint.  Neurological: Sensation intact to light touch.   Assessment:   1. Skin ulcer of left great toe with fat layer exposed (HCC)   2. PAD (peripheral artery disease) (HCC)          Plan:  Patient was evaluated and treated and all questions answered. Ulcer plantar left hallux with fat layer exposed   -Debridement as below. -Dressed with betadine, DSD. -Off-loading with surgical shoe. -No abx indicated.  -Discussed glucose control and proper protein-rich diet.  -Discussed if any worsening redness, pain, fever or chills to call or may need to report to the emergency room. Patient expressed understanding.    Procedure: Excisional Debridement of Wound Tool: Sharp #312 chisel blade/tissue nipper Type of Debridement: Sharp Excisional Frequency:  Every two weeks until appropriately healed.  Dressing is to be changed daily/keeping the wound clean and dry Rationale: Removal of non-viable soft tissue from the wound to promote healing.  Anesthesia: none Pre-Debridement Wound Measurements: Overlying callu s Post-Debridement Wound Measurements: 0.4 cm x 0.2 cm x 0.2 cm  Area devitalized tissue removed(nonviable tissue only): 0.4 cm x 0.4 cm.  Blood loss: Minimal (<10cc) Depth of Debridement: with fat layer exposed Description of tissue removed: Devitalized Tissue and Other: callus Technique: The wound and the surrounding skin were prepped and draped in usual aseptic fashion.  Aseptic technique was maintained throughout the procedure.  Using #312 blade/tissue nipper sharp debridement of necrotic/nonviable tissue was performed until healthy bleeding wound bed was achieved.  No underlying bone or tendon was exposed during debridement.  The wound was thoroughly irrigated with normal saline solution Wound Progress:  Current Wound Volume: Debridement was performed of the chronic nonhealing diabetic foot wound on plantar left hallux .  Debridement removed 0.4 cm x 0.4 cm of the necrotic tissue and subcutaneous tissue and  purulent drainage was not present. Presence/absence of tissue: Necrotic tissue/nonviable tissue present at the base of the wound.  Sharp debridement was performed to remove the necrotic tissue/nonviable tissue back to viable tissue.  No devitalized/nonviable tissue present postdebridement.  Wound appeared clean and clear of infection No material in the wound was present that was identified to be inhibiting healing. Dressing: Dry, sterile, compression dressing. Disposition: Patient tolerated procedure well. Patient to return in 2 weeks for  follow-up or as listed above.  No follow-ups on file.   No follow-ups on file.   Asberry Failing, DPM

## 2024-07-20 ENCOUNTER — Observation Stay (HOSPITAL_COMMUNITY)
Admission: EM | Admit: 2024-07-20 | Discharge: 2024-07-21 | Disposition: A | Discharge status: Skilled Nursing Facility | Source: Skilled Nursing Facility | Attending: Family Medicine | Admitting: Family Medicine

## 2024-07-20 ENCOUNTER — Other Ambulatory Visit: Payer: Self-pay

## 2024-07-20 ENCOUNTER — Encounter (HOSPITAL_COMMUNITY): Payer: Self-pay

## 2024-07-20 DIAGNOSIS — I4891 Unspecified atrial fibrillation: Secondary | ICD-10-CM | POA: Diagnosis not present

## 2024-07-20 DIAGNOSIS — R001 Bradycardia, unspecified: Secondary | ICD-10-CM | POA: Diagnosis present

## 2024-07-20 DIAGNOSIS — Z7901 Long term (current) use of anticoagulants: Secondary | ICD-10-CM | POA: Diagnosis not present

## 2024-07-20 DIAGNOSIS — D509 Iron deficiency anemia, unspecified: Secondary | ICD-10-CM | POA: Diagnosis not present

## 2024-07-20 DIAGNOSIS — N1832 Chronic kidney disease, stage 3b: Secondary | ICD-10-CM | POA: Diagnosis not present

## 2024-07-20 DIAGNOSIS — E039 Hypothyroidism, unspecified: Secondary | ICD-10-CM | POA: Diagnosis not present

## 2024-07-20 DIAGNOSIS — R55 Syncope and collapse: Secondary | ICD-10-CM | POA: Diagnosis not present

## 2024-07-20 DIAGNOSIS — I130 Hypertensive heart and chronic kidney disease with heart failure and stage 1 through stage 4 chronic kidney disease, or unspecified chronic kidney disease: Secondary | ICD-10-CM | POA: Diagnosis not present

## 2024-07-20 DIAGNOSIS — Z8546 Personal history of malignant neoplasm of prostate: Secondary | ICD-10-CM | POA: Diagnosis not present

## 2024-07-20 DIAGNOSIS — Z789 Other specified health status: Secondary | ICD-10-CM

## 2024-07-20 DIAGNOSIS — Z7982 Long term (current) use of aspirin: Secondary | ICD-10-CM | POA: Insufficient documentation

## 2024-07-20 DIAGNOSIS — R319 Hematuria, unspecified: Secondary | ICD-10-CM | POA: Insufficient documentation

## 2024-07-20 DIAGNOSIS — I5032 Chronic diastolic (congestive) heart failure: Secondary | ICD-10-CM | POA: Diagnosis not present

## 2024-07-20 DIAGNOSIS — N390 Urinary tract infection, site not specified: Secondary | ICD-10-CM | POA: Insufficient documentation

## 2024-07-20 DIAGNOSIS — Z7902 Long term (current) use of antithrombotics/antiplatelets: Secondary | ICD-10-CM | POA: Diagnosis not present

## 2024-07-20 DIAGNOSIS — Z79899 Other long term (current) drug therapy: Secondary | ICD-10-CM | POA: Insufficient documentation

## 2024-07-20 DIAGNOSIS — Z79890 Hormone replacement therapy (postmenopausal): Secondary | ICD-10-CM | POA: Diagnosis not present

## 2024-07-20 DIAGNOSIS — Z87891 Personal history of nicotine dependence: Secondary | ICD-10-CM | POA: Diagnosis not present

## 2024-07-20 DIAGNOSIS — I110 Hypertensive heart disease with heart failure: Secondary | ICD-10-CM | POA: Insufficient documentation

## 2024-07-20 DIAGNOSIS — D649 Anemia, unspecified: Secondary | ICD-10-CM | POA: Insufficient documentation

## 2024-07-20 DIAGNOSIS — R5383 Other fatigue: Secondary | ICD-10-CM | POA: Diagnosis present

## 2024-07-20 LAB — CBC
HCT: 32.4 % — ABNORMAL LOW (ref 39.0–52.0)
Hemoglobin: 10.7 g/dL — ABNORMAL LOW (ref 13.0–17.0)
MCH: 30.7 pg (ref 26.0–34.0)
MCHC: 33 g/dL (ref 30.0–36.0)
MCV: 93.1 fL (ref 80.0–100.0)
Platelets: 134 K/uL — ABNORMAL LOW (ref 150–400)
RBC: 3.48 MIL/uL — ABNORMAL LOW (ref 4.22–5.81)
RDW: 13 % (ref 11.5–15.5)
WBC: 4.1 K/uL (ref 4.0–10.5)
nRBC: 0 % (ref 0.0–0.2)

## 2024-07-20 LAB — CBG MONITORING, ED: Glucose-Capillary: 133 mg/dL — ABNORMAL HIGH (ref 70–99)

## 2024-07-20 LAB — URINALYSIS, ROUTINE W REFLEX MICROSCOPIC
Bacteria, UA: NONE SEEN
Bilirubin Urine: NEGATIVE
Glucose, UA: NEGATIVE mg/dL
Hgb urine dipstick: NEGATIVE
Ketones, ur: NEGATIVE mg/dL
Nitrite: NEGATIVE
Protein, ur: NEGATIVE mg/dL
Specific Gravity, Urine: 1.014 (ref 1.005–1.030)
pH: 5 (ref 5.0–8.0)

## 2024-07-20 LAB — COMPREHENSIVE METABOLIC PANEL WITH GFR
ALT: 15 U/L (ref 0–44)
AST: 19 U/L (ref 15–41)
Albumin: 2.4 g/dL — ABNORMAL LOW (ref 3.5–5.0)
Alkaline Phosphatase: 58 U/L (ref 38–126)
Anion gap: 10 (ref 5–15)
BUN: 15 mg/dL (ref 8–23)
CO2: 19 mmol/L — ABNORMAL LOW (ref 22–32)
Calcium: 7.4 mg/dL — ABNORMAL LOW (ref 8.9–10.3)
Chloride: 114 mmol/L — ABNORMAL HIGH (ref 98–111)
Creatinine, Ser: 1.28 mg/dL — ABNORMAL HIGH (ref 0.61–1.24)
GFR, Estimated: 53 mL/min — ABNORMAL LOW (ref >60)
Glucose, Bld: 159 mg/dL — ABNORMAL HIGH (ref 70–99)
Potassium: 3.5 mmol/L (ref 3.5–5.1)
Sodium: 143 mmol/L (ref 135–145)
Total Bilirubin: 1.1 mg/dL (ref 0.0–1.2)
Total Protein: 5 g/dL — ABNORMAL LOW (ref 6.5–8.1)

## 2024-07-20 LAB — TSH: TSH: 0.788 u[IU]/mL (ref 0.350–4.500)

## 2024-07-20 LAB — TROPONIN I (HIGH SENSITIVITY)
Troponin I (High Sensitivity): 15 ng/L (ref <18)
Troponin I (High Sensitivity): 14 ng/L (ref <18)

## 2024-07-20 MED ORDER — DULOXETINE HCL 20 MG PO CPEP
20.0000 mg | ORAL_CAPSULE | Freq: Every day | ORAL | Status: DC
Start: 2024-07-21 — End: 2024-07-22
  Administered 2024-07-21: 20 mg via ORAL
  Filled 2024-07-20: qty 1

## 2024-07-20 MED ORDER — TAMSULOSIN HCL 0.4 MG PO CAPS
0.4000 mg | ORAL_CAPSULE | Freq: Every day | ORAL | Status: DC
  Administered 2024-07-21: 0.4 mg via ORAL
  Filled 2024-07-20: qty 1

## 2024-07-20 MED ORDER — CLOPIDOGREL BISULFATE 75 MG PO TABS
75.0000 mg | ORAL_TABLET | Freq: Every day | ORAL | Status: DC
  Administered 2024-07-21: 75 mg via ORAL
  Filled 2024-07-20: qty 1

## 2024-07-20 MED ORDER — MELATONIN 5 MG PO TABS
5.0000 mg | ORAL_TABLET | Freq: Every day | ORAL | Status: DC
Start: 2024-07-20 — End: 2024-07-22
  Administered 2024-07-20: 5 mg via ORAL
  Filled 2024-07-20: qty 1

## 2024-07-20 MED ORDER — ATORVASTATIN CALCIUM 40 MG PO TABS
40.0000 mg | ORAL_TABLET | Freq: Every day | ORAL | Status: DC
  Administered 2024-07-21: 40 mg via ORAL
  Filled 2024-07-20: qty 1

## 2024-07-20 MED ORDER — SODIUM CHLORIDE 0.9 % IV BOLUS
500.0000 mL | Freq: Once | INTRAVENOUS | Status: AC
  Administered 2024-07-20: 500 mL via INTRAVENOUS

## 2024-07-20 MED ORDER — BREXPIPRAZOLE 2 MG PO TABS
2.0000 mg | ORAL_TABLET | Freq: Every day | ORAL | Status: DC
  Administered 2024-07-20: 2 mg via ORAL
  Filled 2024-07-20 (×3): qty 1

## 2024-07-20 MED ORDER — LEVOTHYROXINE SODIUM 25 MCG PO TABS
125.0000 ug | ORAL_TABLET | Freq: Every day | ORAL | Status: DC
  Administered 2024-07-21: 125 ug via ORAL
  Filled 2024-07-20: qty 1

## 2024-07-20 MED ORDER — ENOXAPARIN SODIUM 40 MG/0.4ML IJ SOSY
40.0000 mg | PREFILLED_SYRINGE | INTRAMUSCULAR | Status: DC
Start: 2024-07-21 — End: 2024-07-22
  Administered 2024-07-21: 40 mg via SUBCUTANEOUS
  Filled 2024-07-20: qty 0.4

## 2024-07-20 MED ORDER — ASPIRIN 81 MG PO TBEC
81.0000 mg | DELAYED_RELEASE_TABLET | Freq: Every day | ORAL | Status: DC
  Administered 2024-07-21: 81 mg via ORAL
  Filled 2024-07-20: qty 1

## 2024-07-20 NOTE — Plan of Care (Signed)
 Called son, confirmed identity.  Let son know that patient is being admitted.  Son reports patient is mostly alert and oriented x 3 at baseline.

## 2024-07-20 NOTE — Assessment & Plan Note (Addendum)
 Patient reports having hematuria several weeks ago, currently asymptomatic. Currently undergoing outpatient management with urology. Per urology, pt was supposed to have CT hematuria and f/u with cystoscopy.  - Recommend outpatient follow-up at discharge.

## 2024-07-20 NOTE — H&P (Addendum)
 Hospital Admission History and Physical Service Pager: [phone]  Patient name: [name] [name] Medical record number: [id number] Date of Birth: 04/06/1932 Age: 89 y.o. Gender: male  Primary Care Provider: Joshua Francisco, MD  Consultants: None Code Status: FULL Preferred Emergency Contact:  Contact Information     Name Relation Home Work [place] Son   [phone]   [facility] Spouse [phone]     VERLENE WARREN VERLENE   [phone]      Other Contacts     Name Relation Home Work Mobile   [place] [phone]          Chief Complaint: weakness; fatigue  Differential and Medical Decision Making:  NAZAR [name] is a 89 y.o. male presenting with generalized weakness and fatigue. Had episode of hypotension prior to arrival, and one episode of bradycardia (HR 38) in ED. Per chart review, patient has history of sinus bradycardia and sinus node dysfunction. Differential for this patient's presentation of this includes sick sinus syndrome (likely given symptoms of bradycardia, dizziness, pre-syncope) and tachy-brady syndrome (periods of both slow and fast heart rate). Orthostatic hypotension, may also be contributing. Reassuring that patient is without chest pain and troponin's negative. Given pre-syncope we plan to observe on tele for at least 24 hours. Assessment & Plan Bradycardia Atrial fibrillation (HCC) On admission, EKG notable for A-fib. Hemodynamically stable, HR 50's-60's. Will continue to monitor over the next 24 hours. - Admit to FMTS for observation, attending McDiarmid - Telemetry, Vital signs per floor - Echocardiogram - Orthostatic vitals - PT/OT to treat - Fall precautions - Delirium precautions  Anemia Patient has hx of iron-deficiency anemia.  - Ordered Fe studies, can supplement if indicated - AM CBC UTI (urinary tract infection) Patient w/ hx of BPH, prostate CA. Was seen by urology recently and found to have UTI and started on  antibiotics. - Nitrofurantoin  for UTI per Urology (9/16-9/23) - UA pending Hematuria Patient reports having hematuria several weeks ago, currently asymptomatic. Currently undergoing outpatient management with urology. Per urology, pt was supposed to have CT hematuria and f/u with cystoscopy.  - Recommend outpatient follow-up at discharge. Chronic health problem PAD - s/p revascularization for critical limb ischemia of LLE in 01/2024; continue Plavix  + ASA, atorvastatin  Chronic HFpeF - Last echo in 11/2022 EF 60-65% HTN - well-controlled; holding home lisinopril  to monitor for sx of orthostasis Hypothyroidism - continue home synthroid ; TSH pending BPH - continue home Tamsulosin   FEN/GI: Regular diet VTE Prophylaxis: Lovenox   Disposition: Med-Tele  History of Present Illness:  [name] [name] is a 89 y.o. male presenting with from SNF with chief complaint of generalized weakness and fatigue. Patient had been in usual state of health until this afternoon when he had gone outside for 30 minutes and begin experiencing weakness and fatigue. He is unsure of what exactly happened. Reports he has eaten today. Denies fall or LOC. Denies palpitations, chest pain, SOB. Reports he has been having blood in urine, last incident was a couple of weeks ago. States he saw a provider regarding this issue. Has a history of A-fib, but is not currently taking medications for this.  Patient was noted to have systolic BP of 80's-90's and was given 500 mL bolus NS by EMS en route to hospital. In the ED, patient found to be A&Ox3, bradycardic, otherwise hemodynamically stable. Troponins were 15.   Review Of Systems: Per HPI with the following additions: N/A  Pertinent Past Medical History: Chronic HFpeF HTN Hypothyroidism Iron  deficiency anemia Peripheral vascular disease Prostate cancer Remainder reviewed in history tab.   Pertinent Past Surgical History: Hip surgery  Remainder reviewed in history tab.    Pertinent Social History: Tobacco use: Former Alcohol use: Denies Other Substance use: Denies Lives at [facility]  Pertinent Family History: Mother - heart disease, throat cancer Father - hypertension  Brother - prostate cancer Nephew - prostate cancer  Important Outpatient Medications: Aspirin  81 mg daily  Atorvastatin  40 mg daily  Brexpiprazole  2 mg at bedtime  Clopidogrel  75 mg daily  Duloxetine  20 mg daily  Levothyroxine  125 mcg daily  Lisinopril  10 mg daily  Melatonin 5 mg daily  Miralax 17 g daily  Tamsulosin  0.4 mg daily  Tizanidine 2 mg TID prn   Currenty on nitrofurantoin  for UTI per Urology (9/16-9/23)  Objective: BP 113/60   Pulse (!) 56   Temp 97.9 F (36.6 C) (Oral)   Resp 17   Ht 6' 4 (1.93 m)   Wt 129 kg   SpO2 100%   BMI 34.62 kg/m   Exam: General: elderly male, lying in bed, NAD Eyes: no scleral icterus; EOM intact Cardiovascular: irregular rate and rhythm Respiratory: normal respiratory efforts; anterior lung fields CTAB Gastrointestinal: normoactive bowel sounds; soft, non-tender, non-distended MSK: ROM grossly intact Derm: left toes wrapped; warm and dry Neuro: alert Psych: pleasant, appropriate affect  Labs:  CBC BMET  Recent Labs  Lab 07/20/24 1643  WBC 4.1  HGB 10.7*  HCT 32.4*  PLT 134*   Recent Labs  Lab 07/20/24 1643  NA 143  K 3.5  CL 114*  CO2 19*  BUN 15  CREATININE 1.28*  GLUCOSE 159*  CALCIUM  7.4*    Pertinent additional labs: Trop 15 TSH and UA pending  EKG: A-fib   Imaging Studies Performed: None   Mannie Ashley SAILOR, MD 07/20/2024, 7:19 PM PGY-1, [facility]  FPTS Intern pager: [phone], text pages welcome Secure chat group [facility] Teaching Service   Upper Level Addendum: I have seen and evaluated this patient along with Dr. Mannie and reviewed the above note, making necessary revisions as appropriate. I agree with the medical decision making and  physical exam as noted above. Gladis Church, DO PGY-3 [facility]

## 2024-07-20 NOTE — Assessment & Plan Note (Signed)
 Patient w/ hx of BPH, prostate CA. Was seen by urology recently and found to have UTI and started on antibiotics. - Nitrofurantoin for UTI per Urology (9/16-9/23) - UA pending

## 2024-07-20 NOTE — ED Provider Notes (Signed)
 [place] EMERGENCY DEPARTMENT AT [facility] Provider Note   CSN: [id number] Arrival date & time: 07/20/24  1636     Patient presents with: Fatigue   [name] [name] is a 89 y.o. male.   HPI Comes in from [place] nursing home.  Had been outside for around 30 minutes.  States he thought he was on he could be out for couple minutes.  Reportedly then got lethargic.  Patient states he is not sure exactly what happened.  States he feels much better now.  States he did have lunch.  Reportedly had hypotension initially.  Blood pressure 112/96 here.   Past Medical History:  Diagnosis Date   B12 deficiency    Chronic heart failure with preserved ejection fraction (HFpEF) (HCC)    Falls    Hypertension    Hypothyroidism    Iron deficiency anemia    Peripheral vascular disease (HCC)    Prostate cancer (HCC)    Trifascicular block     Prior to Admission medications   Medication Sig Start Date End Date Taking? Authorizing Provider  acetaminophen  (TYLENOL ) 500 MG tablet Take 500 mg by mouth every 6 (six) hours as needed.    [provider]  aspirin  EC 81 MG tablet Take 81 mg by mouth daily. Swallow whole.    [provider]  atorvastatin  (LIPITOR) 40 MG tablet Take 1 tablet (40 mg total) by mouth daily. 04/25/24 04/25/25  Lanis Fonda BRAVO, MD  brexpiprazole  (REXULTI ) 2 MG TABS tablet Take 2 mg by mouth at bedtime.    [provider]  Carboxymethylcellulose Sod PF 1 % GEL Apply 1 drop to eye See admin instructions. 2-4 times daily for dry eye.    [provider]  clopidogrel  (PLAVIX ) 75 MG tablet Take 1 tablet (75 mg total) by mouth daily. 04/25/24 04/25/25  Robins, Joshua E, MD  DULoxetine  (CYMBALTA ) 20 MG capsule Take 20 mg by mouth daily.    [provider]  ergocalciferol (VITAMIN D2) 1.25 MG (50000 UT) capsule Take 50,000 Units by mouth every Monday.    [provider]  levothyroxine  (SYNTHROID ) 125 MCG tablet Take 125 mcg  by mouth daily before breakfast.    [provider]  lidocaine  4 % Place 1 patch onto the skin daily.    [provider]  lisinopril  (ZESTRIL ) 10 MG tablet Take 10 mg by mouth daily.    [provider]  melatonin 5 MG TABS Take 5 mg by mouth at bedtime.    [provider]  polyethylene glycol (MIRALAX / GLYCOLAX) 17 g packet Take 17 g by mouth daily.    [provider]  tamsulosin  (FLOMAX ) 0.4 MG CAPS capsule Take 0.4 mg by mouth daily. 09/24/19   [provider]  tizanidine (ZANAFLEX) 2 MG capsule Take 2 mg by mouth 3 (three) times daily as needed for muscle spasms.    [provider]    Allergies: Sildenafil    Review of Systems  Updated Vital Signs BP 113/60   Pulse (!) 56   Temp 97.9 F (36.6 C) (Oral)   Resp 17   Ht 6' 4 (1.93 m)   Wt 129 kg   SpO2 100%   BMI 34.62 kg/m   Physical Exam Vitals and nursing note reviewed.  HENT:     Head: Normocephalic.  Cardiovascular:     Rate and Rhythm: Regular rhythm.  Pulmonary:     Breath sounds: No wheezing.  Abdominal:  Tenderness: There is no abdominal tenderness.  Musculoskeletal:     Right lower leg: No edema.     Left lower leg: No edema.  Neurological:     Mental Status: He is alert. Mental status is at baseline.     (all labs ordered are listed, but only abnormal results are displayed) Labs Reviewed  COMPREHENSIVE METABOLIC PANEL WITH GFR - Abnormal; Notable for the following components:      Result Value   Chloride 114 (*)    CO2 19 (*)    Glucose, Bld 159 (*)    Creatinine, Ser 1.28 (*)    Calcium  7.4 (*)    Total Protein 5.0 (*)    Albumin 2.4 (*)    GFR, Estimated 53 (*)    All other components within normal limits  CBC - Abnormal; Notable for the following components:   RBC 3.48 (*)    Hemoglobin 10.7 (*)    HCT 32.4 (*)    Platelets 134 (*)    All other components within normal limits  CBG MONITORING, ED - Abnormal; Notable for the  following components:   Glucose-Capillary 133 (*)    All other components within normal limits  TSH  URINALYSIS, ROUTINE W REFLEX MICROSCOPIC  TROPONIN I (HIGH SENSITIVITY)  TROPONIN I (HIGH SENSITIVITY)    EKG: EKG Interpretation Date/Time:  Friday July 20 2024 16:45:47 EDT Ventricular Rate:  64 PR Interval:    QRS Duration:  146 QT Interval:  443 QTC Calculation: 450 R Axis:   -46  Text Interpretation: Atrial fibrillation RBBB and LAFB Abnormal T, consider ischemia, lateral leads Artifact in lead(s) I II III aVR aVL Confirmed by Patsey Lot [phone]) on 07/20/2024 6:21:49 PM  Radiology: No results found.   Procedures   Medications Ordered in the ED  sodium chloride  0.9 % bolus 500 mL (0 mLs Intravenous Stopped 07/20/24 1815)                                    Medical Decision Making Amount and/or Complexity of Data Reviewed Labs: ordered.  Risk Decision regarding hospitalization.   Patient with decreased mental status.  Mild hypotension.  Differential diagnosis includes infection but not having fevers makes this less likely.  Also was outside and it is over 80 degrees today.  May be an exposure issue.  States he has eaten today.  Will get basic blood work and give a small fluid bolus.  Patient appears back at his baseline at this point.  Has a anemia.  Patient initially denies any bleeding or black stools but later states he has been having blood in the urine.  Creatinine slightly increased from prior.  Has been this high before at 1.3 but looks like baseline is more around 1 or 1.1.  Is history of A-fib.  Is in A-fib now but does have bradycardia at times down into the 40s.  Has had this before but I think with his recent potential syncopal episode would benefit from further monitoring.  Will discuss with unassigned medicine  Not on Eliquis .  Does not appear on any rate control medicines.     Final diagnoses:  Near syncope  Anemia, unspecified type   Bradycardia    ED Discharge Orders     None          Patsey Lot, MD 07/20/24 [phone]

## 2024-07-20 NOTE — Assessment & Plan Note (Addendum)
 On admission, EKG notable for A-fib. Hemodynamically stable, HR 50's-60's. Will continue to monitor over the next 24 hours. - Admit to FMTS for observation, attending McDiarmid - Telemetry, Vital signs per floor - Echocardiogram - Orthostatic vitals - PT/OT to treat - Fall precautions - Delirium precautions

## 2024-07-20 NOTE — ED Triage Notes (Addendum)
 Pt to ED via EMS with c/o generalized weakness. Pt coming from [facility]. Pt was outside for 30 minutes and when he came inside facility noticed pt more weak and tired than normal. Facility called EMS. EMS noted SBPs from 80s-90s initially. Pt given 500ml NS en route. SBP to 100s post fluids. Pt has hx a-fib/dementia. Pt currently A&Ox3. Pt maintaining own airway and secretions. Pt breathing even and unlabored.

## 2024-07-20 NOTE — Assessment & Plan Note (Addendum)
 PAD - s/p revascularization for critical limb ischemia of LLE in 01/2024; continue Plavix  + ASA, atorvastatin  Chronic HFpeF - Last echo in 11/2022 EF 60-65% HTN - well-controlled; holding home lisinopril  to monitor for sx of orthostasis Hypothyroidism - continue home synthroid ; TSH pending BPH - continue home Tamsulosin 

## 2024-07-20 NOTE — Hospital Course (Addendum)
 [name] [name] is a 89 y.o. male with history of Chronic HFpeF, HTN, Hypothyroidism, Iron deficiency anemia, PVD, Prostate CA who was admitted for generalized weakness and fatigue.  Atrial Fibrillation Bradycardia Patient presented with chief complaint of weakness and fatigue. He was found to be bradycardic with soft BP's, which resolved with fluid bolus. EKG notable for A-fib. Patient was admitted for observation and placed on telemetry. Tizanidine stopped given this can cause bradycardia. Orthostatics with decrease in BP from standing to sitting; patient restarted on reduced BP medication. Echo without significant change from prior; EF of 55-60%.  Anemia Hgb 10.7 on admission. Ferritin of 18; patient was started on iron with ferrous sulfate  325 mg every other day at discharge. Undergoing outpatient workup with urology for hematuria.  UTI Patient w/ hx of BPH, prostate CA. Was seen by urology recently and found to have UTI and started on antibiotics. Nitrofurantoin  for UTI per Urology (9/16-9/23).  Hematuria Patient reports having hematuria several weeks ago, currently asymptomatic. Currently undergoing outpatient management with urology. Per urology, pt was supposed to have CTAP and f/u with cystoscopy.   Other chronic conditions were medically managed with home medications and formulary alternatives as necessary (UTI, Hypothyroidism, Iron deficiency anemia, PVD)  Follow-up recommendations Follow up with urology outpatient for evaluation of hematuria (CT hematuria and f/u cystoscopy) Consider stopping DAPT therapy (started by Dr. Fonda Rim in vascular following balloon angioplasty of left leg on 04/25/24) Needs follow-up cardiology appointment

## 2024-07-20 NOTE — Assessment & Plan Note (Signed)
 Patient has hx of iron-deficiency anemia.  - Ordered Fe studies, can supplement if indicated - AM CBC

## 2024-07-21 ENCOUNTER — Encounter (HOSPITAL_COMMUNITY): Payer: Self-pay | Admitting: Student

## 2024-07-21 ENCOUNTER — Observation Stay (HOSPITAL_BASED_OUTPATIENT_CLINIC_OR_DEPARTMENT_OTHER)

## 2024-07-21 DIAGNOSIS — I4811 Longstanding persistent atrial fibrillation: Secondary | ICD-10-CM

## 2024-07-21 DIAGNOSIS — I4891 Unspecified atrial fibrillation: Secondary | ICD-10-CM

## 2024-07-21 DIAGNOSIS — I739 Peripheral vascular disease, unspecified: Secondary | ICD-10-CM | POA: Insufficient documentation

## 2024-07-21 DIAGNOSIS — R55 Syncope and collapse: Principal | ICD-10-CM

## 2024-07-21 DIAGNOSIS — R001 Bradycardia, unspecified: Secondary | ICD-10-CM | POA: Diagnosis not present

## 2024-07-21 DIAGNOSIS — D500 Iron deficiency anemia secondary to blood loss (chronic): Secondary | ICD-10-CM

## 2024-07-21 LAB — FERRITIN: Ferritin: 18 ng/mL — ABNORMAL LOW (ref 24–336)

## 2024-07-21 LAB — ECHOCARDIOGRAM COMPLETE
Height: 76 in
S' Lateral: 3.6 cm
Weight: 2918.4 [oz_av]

## 2024-07-21 LAB — IRON AND TIBC
Iron: 52 ug/dL (ref 45–182)
Saturation Ratios: 17 % — ABNORMAL LOW (ref 17.9–39.5)
TIBC: 314 ug/dL (ref 250–450)
UIBC: 262 ug/dL

## 2024-07-21 LAB — BASIC METABOLIC PANEL WITH GFR
Anion gap: 7 (ref 5–15)
BUN: 14 mg/dL (ref 8–23)
CO2: 21 mmol/L — ABNORMAL LOW (ref 22–32)
Calcium: 8.8 mg/dL — ABNORMAL LOW (ref 8.9–10.3)
Chloride: 111 mmol/L (ref 98–111)
Creatinine, Ser: 1 mg/dL (ref 0.61–1.24)
GFR, Estimated: >60 mL/min (ref >60)
Glucose, Bld: 109 mg/dL — ABNORMAL HIGH (ref 70–99)
Potassium: 4.2 mmol/L (ref 3.5–5.1)
Sodium: 139 mmol/L (ref 135–145)

## 2024-07-21 MED ORDER — FERROUS SULFATE 325 (65 FE) MG PO TABS
325.0000 mg | ORAL_TABLET | ORAL | Status: DC
  Administered 2024-07-21: 325 mg via ORAL
  Filled 2024-07-21: qty 1

## 2024-07-21 MED ORDER — FERROUS SULFATE 325 (65 FE) MG PO TABS: 325.0000 mg | ORAL_TABLET | ORAL | Status: AC

## 2024-07-21 MED ORDER — LISINOPRIL 10 MG PO TABS: 5.0000 mg | ORAL_TABLET | Freq: Every day | ORAL | Status: AC

## 2024-07-21 MED ORDER — NITROFURANTOIN MONOHYD MACRO 100 MG PO CAPS
100.0000 mg | ORAL_CAPSULE | Freq: Two times a day (BID) | ORAL | Status: DC
  Administered 2024-07-21: 100 mg via ORAL
  Filled 2024-07-21 (×2): qty 1

## 2024-07-21 MED ORDER — LISINOPRIL 10 MG PO TABS
5.0000 mg | ORAL_TABLET | Freq: Every day | ORAL | Status: DC
  Administered 2024-07-21: 5 mg via ORAL
  Filled 2024-07-21: qty 1

## 2024-07-21 NOTE — Progress Notes (Signed)
 OT Cancellation Note  Patient Details Name: [name] [name] MRN: [id number] DOB: 1931-11-07   Cancelled Treatment:    Reason Eval/Treat Not Completed: Patient at procedure or test/ unavailable.  Will attempt again as schedule allows   [name] 07/21/2024, 9:20 AM Inocente Browner OTR/L

## 2024-07-21 NOTE — Evaluation (Signed)
 Physical Therapy Evaluation Patient Details Name: [name] [name] MRN: [id number] DOB: July 02, 1932 Today's Date: 07/21/2024  History of Present Illness  Pt is a 89 y.o. M who presents 07/19/2024 with dizziness, generalized weakness and hypotension. Admitted with UGIB and hemorrhagic shock. CTA with no evidence for active bleeding.  Intubated 9/18-9/19. Significant PMH: Afib on Eliquis , DM2, HTN, CKD 3b, CAD, PPM.  Clinical Impression  Pt appears to be fairly close to his functional baseline. PTA, pt was living at [facility] LTC. Pt pleasant and A&O to self and city. Pt follows 1-2 step commands well. Requiring light min assist for transfers and ambulating 200 ft with a walker and CGA. Reports mild DOE, but denies dizziness. BP with drop from supine to standing from 143/84 to 119/72, but then increased to 137/79 after standing for 3 minutes. Will continue to follow acutely.      If plan is discharge home, recommend the following: A little help with walking and/or transfers;A little help with bathing/dressing/bathroom;Assistance with cooking/housework;Assist for transportation;Supervision due to cognitive status   Can travel by private vehicle   Yes    Equipment Recommendations None recommended by PT  Recommendations for Other Services       Functional Status Assessment Patient has not had a recent decline in their functional status     Precautions / Restrictions Precautions Precautions: Fall Restrictions Weight Bearing Restrictions Per Provider Order: No      Mobility  Bed Mobility Overal bed mobility: Modified Independent                  Transfers Overall transfer level: Needs assistance Equipment used: Rolling walker (2 wheels) Transfers: Sit to/from Stand Sit to Stand: Min assist           General transfer comment: Light minA to power up to stand    Ambulation/Gait Ambulation/Gait assistance: Contact guard assist Gait Distance (Feet): 200 Feet Assistive  device: Rolling walker (2 wheels) Gait Pattern/deviations: Step-through pattern, Decreased dorsiflexion - right, Decreased dorsiflexion - left       General Gait Details: Cues for walker proximity  Stairs            Wheelchair Mobility     Tilt Bed    Modified Rankin (Stroke Patients Only)       Balance Overall balance assessment: Needs assistance Sitting-balance support: Feet supported Sitting balance-Leahy Scale: Good     Standing balance support: Bilateral upper extremity supported Standing balance-Leahy Scale: Poor                               Pertinent Vitals/Pain Pain Assessment Pain Assessment: No/denies pain    Home Living Family/patient expects to be discharged to:: Skilled nursing facility                        Prior Function Prior Level of Function : Needs assist             Mobility Comments: using RW ADLs Comments: reports requiring assist for ADL's     Extremity/Trunk Assessment   Upper Extremity Assessment Upper Extremity Assessment: Defer to OT evaluation    Lower Extremity Assessment Lower Extremity Assessment: Overall WFL for tasks assessed    Cervical / Trunk Assessment Cervical / Trunk Assessment: Normal  Communication   Communication Communication: No apparent difficulties    Cognition Arousal: Alert Behavior During Therapy: WFL for tasks assessed/performed   PT - Cognitive  impairments: Orientation, No family/caregiver present to determine baseline, Memory   Orientation impairments: Person                   PT - Cognition Comments: Not oriented to place or time, stating he was at [employer] and the month was October. Unable to recall month after being re-oriented Following commands: Intact       Cueing Cueing Techniques: Verbal cues     General Comments      Exercises     Assessment/Plan    PT Assessment Patient needs continued PT services  PT Problem List Decreased  balance;Decreased activity tolerance;Decreased mobility       PT Treatment Interventions DME instruction;Gait training;Therapeutic activities;Functional mobility training;Therapeutic exercise;Balance training;Patient/family education    PT Goals (Current goals can be found in the Care Plan section)  Acute Rehab PT Goals Patient Stated Goal: did not state PT Goal Formulation: With patient Time For Goal Achievement: 08/04/24 Potential to Achieve Goals: Good    Frequency Min 1X/week     Co-evaluation               AM-PAC PT 6 Clicks Mobility  Outcome Measure Help needed turning from your back to your side while in a flat bed without using bedrails?: None Help needed moving from lying on your back to sitting on the side of a flat bed without using bedrails?: None Help needed moving to and from a bed to a chair (including a wheelchair)?: A Little Help needed standing up from a chair using your arms (e.g., wheelchair or bedside chair)?: A Little Help needed to walk in hospital room?: A Little Help needed climbing 3-5 steps with a railing? : A Little 6 Click Score: 20    End of Session Equipment Utilized During Treatment: Gait belt Activity Tolerance: Patient tolerated treatment well Patient left: in chair;with call bell/phone within reach;with chair alarm set Nurse Communication: Mobility status PT Visit Diagnosis: Unsteadiness on feet (R26.81)    Time: 8956-8889 PT Time Calculation (min) (ACUTE ONLY): 27 min   Charges:   PT Evaluation $PT Eval Low Complexity: 1 Low PT Treatments $Therapeutic Activity: 8-22 mins PT General Charges $$ ACUTE PT VISIT: 1 Visit         Aleck Daring, PT, DPT Acute Rehabilitation Services Office [phone]   [name] 07/21/2024, 12:44 PM

## 2024-07-21 NOTE — Assessment & Plan Note (Addendum)
 Patient w/ hx of BPH, prostate CA. Was seen by urology recently and found to have UTI and started on antibiotics. UA this admission without evidence of UTI; will finish previously prescribed abx course. - Nitrofurantoin  100 mg BID for UTI per Urology (9/16-9/23)

## 2024-07-21 NOTE — TOC Transition Note (Addendum)
 Transition of Care [facility]) - Discharge Note   Patient Details  Name: ARSEN MANGIONE MRN: [id number] Date of Birth: 1932-09-18  [facility] CM/SW Contact:  Gwenn Julien Norris, [place] Phone Number: 07/21/2024, 2:15 PM   Clinical Narrative: Pt for dc back to [facility] where he is a LTC resident. Spoke to [place] in admissions who confirmed they are prepared to admit pt back to room 518. Pt's son Taft updated and agreeable to dc plan. RN provided with number for report and PTAR arranged for transport. SW signing off at dc.   Julien Gwenn, MSW, LCSW [phone] (coverage)        Final next level of care: Skilled Nursing Facility Barriers to Discharge: Barriers Resolved   Patient Goals and CMS Choice            Discharge Placement              Patient chooses bed at: [facility] Patient to be transferred to facility by: PTAR   Patient and family notified of of transfer: 07/21/24  Discharge Plan and Services Additional resources added to the After Visit Summary for                                       Social Drivers of Health (SDOH) Interventions SDOH Screenings   Food Insecurity: No Food Insecurity (07/21/2024)  Housing: Low Risk  (07/21/2024)  Transportation Needs: No Transportation Needs (07/21/2024)  Utilities: Not At Risk (07/21/2024)  Social Connections: Moderately Integrated (07/21/2024)  Tobacco Use: Medium Risk (07/20/2024)     Readmission Risk Interventions     No data to display

## 2024-07-21 NOTE — Assessment & Plan Note (Addendum)
 Patient has hx of iron-deficiency anemia. Fe low at 18. - Discharge patient on ferrous sulfate  325 mg every other day

## 2024-07-21 NOTE — Assessment & Plan Note (Addendum)
 Currently asymptomatic. - Repeat echo pending - PT/OT unable to see patient this morning given he was at echo, reached out to social work to see if this eval is needed prior to sending patient back to SNF - Fall and delirium precautions in place - [facility] consulted for placement back to [facility]

## 2024-07-21 NOTE — Progress Notes (Signed)
 Report called to Paediatric nurse at [facility].

## 2024-07-21 NOTE — Discharge Summary (Addendum)
 Family Medicine Teaching [facility] Discharge Summary  Patient name: [name] [name] Medical record number: [id number] Date of birth: 10/23/1932 Age: 89 y.o. Gender: male Date of Admission: 07/20/2024  Date of Discharge: 07/21/2024 Admitting Physician: Gladis Church, DO  Primary Care Provider: Joshua Francisco, MD Consultants: None  Indication for Hospitalization: Weakness and bradycardia  Discharge Diagnoses/Problem List:  Principal Problem for Admission: Bradycardia Other Problems addressed during stay:  Principal Problem:   Bradycardia Active Problems:   Atrial fibrillation (HCC)   Anemia   UTI (urinary tract infection)   Hematuria  Brief Hospital Course:  KYNDAL GLOSTER is a 89 y.o. male with history of Chronic HFpeF, HTN, Hypothyroidism, Iron deficiency anemia, PVD, Prostate CA who was admitted for generalized weakness and fatigue.  Atrial Fibrillation  Bradycardia Patient presented with chief complaint of weakness and fatigue. He was found to be bradycardic with soft BP's, which resolved with fluid bolus. EKG notable for A-fib. Patient was admitted for observation and placed on telemetry. Tizanidine stopped given this can cause bradycardia. Orthostatics with decrease in BP from standing to sitting; patient restarted on reduced BP medication, titrated to his standing BPs. Echo without significant change from prior; EF of 55-60%.  Anemia Hgb 10.7 on admission. Ferritin of 18; patient was started on iron with ferrous sulfate  325 mg every other day at discharge.  UTI Patient w/ hx of BPH, prostate CA. Was seen by urology recently and found to have UTI and started on antibiotics. Nitrofurantoin  for UTI per Urology (9/16-9/23).  Hematuria Patient reports having hematuria several weeks ago, currently asymptomatic. Currently undergoing outpatient management with urology. Per urology, pt was supposed to have CTAP and f/u with cystoscopy.  No hematuria noted on UA while  inpatient.  Other chronic conditions were medically managed with home medications and formulary alternatives as necessary (UTI, Hypothyroidism, Iron deficiency anemia, PVD)  Follow-up recommendations Follow up with urology outpatient for evaluation of hematuria (CT hematuria and f/u cystoscopy). Consider stopping DAPT therapy (started by Dr. Fonda Rim in vascular following balloon angioplasty of left leg on 04/25/24).  Follow-up with vascular outpatient. Needs follow-up cardiology appointment.  Results/Tests Pending at Time of Discharge: None  Disposition: Back to [facility] (where patient was admitted from)  Discharge Condition: Stable  Discharge Exam:  Vitals:   07/21/24 0755 07/21/24 1218  BP: (!) 144/70 133/85  Pulse: (!) 55 61  Resp: 16 17  Temp: (!) 97.5 F (36.4 C) 97.9 F (36.6 C)  SpO2: 100% 100%   Physical exam: General: Patient seated in recliner eating lunch, no acute distress. Cardiovascular: Irregularly irregular, no bradycardia, no murmurs/rubs/gallops. Respiratory: Normal work of breathing on room air. Clear to auscultation bilaterally; no wheezes, crackles. Abdomen: Bowel sounds present and normoactive bilaterally. Soft, nondistended, nontender. Extremities: Skin warm, dry. No bilateral lower extremity edema.  Significant Procedures: None  Significant Labs and Imaging:  Recent Labs  Lab 07/20/24 1643  WBC 4.1  HGB 10.7*  HCT 32.4*  PLT 134*   Recent Labs  Lab 07/20/24 1643 07/21/24 0557  NA 143 139  K 3.5 4.2  CL 114* 111  CO2 19* 21*  GLUCOSE 159* 109*  BUN 15 14  CREATININE 1.28* 1.00  CALCIUM  7.4* 8.8*  ALKPHOS 58  --   AST 19  --   ALT 15  --   ALBUMIN 2.4*  --    Iron/TIBC/Ferritin/ %Sat    Component Value Date/Time   IRON 52 07/21/2024 0557   TIBC 314 07/21/2024 0557  FERRITIN 18 (L) 07/21/2024 0557   IRONPCTSAT 17 (L) 07/21/2024 0557   Echo 9/20: LVEF 55 to 60%, mild concentric LVH.  RV with normal function.  Trivial  MR.  Trivial AR.  No significant change from prior study done 11/2022.  Discharge Medications:  Allergies as of 07/21/2024       Reactions   Sildenafil Other (See Comments)   Other reaction(s): Blurring of visual image        Medication List     STOP taking these medications    tizanidine 2 MG capsule Commonly known as: ZANAFLEX       TAKE these medications    acetaminophen  500 MG tablet Commonly known as: TYLENOL  Take 500 mg by mouth every 6 (six) hours as needed for mild pain (pain score 1-3) or moderate pain (pain score 4-6).   aspirin  EC 81 MG tablet Take 81 mg by mouth daily. Swallow whole.   atorvastatin  40 MG tablet Commonly known as: Lipitor Take 1 tablet (40 mg total) by mouth daily.   brexpiprazole  2 MG Tabs tablet Commonly known as: REXULTI  Take 2 mg by mouth at bedtime.   clopidogrel  75 MG tablet Commonly known as: Plavix  Take 1 tablet (75 mg total) by mouth daily.   DULoxetine  20 MG capsule Commonly known as: CYMBALTA  Take 20 mg by mouth daily.   ferrous sulfate  325 (65 FE) MG tablet Take 1 tablet (325 mg total) by mouth every other day. Start taking on: July 23, 2024   levothyroxine  125 MCG tablet Commonly known as: SYNTHROID  Take 125 mcg by mouth daily before breakfast.   lidocaine  4 % Place 1 patch onto the skin daily.   lisinopril  10 MG tablet Commonly known as: ZESTRIL  Take 0.5 tablets (5 mg total) by mouth daily. What changed: how much to take   melatonin 5 MG Tabs Take 5 mg by mouth at bedtime.   nitrofurantoin  (macrocrystal-monohydrate) 100 MG capsule Commonly known as: MACROBID  Take 100 mg by mouth 2 (two) times daily.   polyethylene glycol 17 g packet Commonly known as: MIRALAX / GLYCOLAX Take 17 g by mouth daily.   tamsulosin  0.4 MG Caps capsule Commonly known as: FLOMAX  Take 0.4 mg by mouth daily.        Discharge Instructions: Please refer to Patient Instructions section of EMR for full details.  Patient  was counseled important signs and symptoms that should prompt return to medical care, changes in medications, dietary instructions, activity restrictions, and follow up appointments.   Follow-Up Appointments:   Contact information for follow-up providers     Joshua Francisco, MD. Schedule an appointment as soon as possible for a visit.   Specialty: Family Medicine Contact information: [address] [place] [address] [phone]              Contact information for after-discharge care     Destination     [facility]   Service: Skilled Nursing Contact information: [address] [place]  [address] [phone]                      Larraine Palma, MD 07/21/2024, 1:27 PM PGY-1, [facility]   I have discussed the above with Dr. Larraine and agree with the documented plan. My edits for correction/addition/clarification are included above. Please see any attending notes.   Kathrine Melena, DO PGY-2, [facility] 07/21/2024 1:34 PM

## 2024-07-21 NOTE — Discharge Instructions (Addendum)
 Dear [name] [name],  Thank you for letting us  participate in your care. You were hospitalized for weakness and fatigue and diagnosed with Bradycardia (a slow heartbeat). You were treated with IV fluids and medication adjustments. We stopped your tizanidine, as this can slow your heart rate. We also decreased your blood pressure medication. We also did an ultrasound of your heart (an echocardiogram) which did not show anything concerning.  We also found that you were anemic with a decreased blood count and a decreased iron level, so we started you on iron supplementation, please take this every other day. Please note that your stool may appear darker with taking this.  MEDICATION CHANGES - STOP tizanidine - DECREASE lisinopril  to 5 mg once a day; you can take a half a pill of your current 10 mg pills - Take your Macrobid  (Nitrofurantoin ) as prescribed; your last day of this will be Tuesday 9/23. - Continue your other medications as prescribed  POST-HOSPITAL & CARE INSTRUCTIONS Take your medications  Follow-up with urology Follow-up with cardiology Please call to arrange follow up with your primary care physician in 1 week.  Go to your follow up appointments (listed below)  DOCTOR'S APPOINTMENT   Future Appointments  Date Time Provider Department Center  07/25/2024  9:30 AM Joya Stabs, DPM [facility]    Take care and be well!  Family Medicine Teaching Service Inpatient Team [place]  [facility]  [address] [place], [place] [address] [phone]

## 2024-07-21 NOTE — Progress Notes (Signed)
 Daily Progress Note Intern Pager: [phone]  Patient name: [name] [name] Medical record number: [id number] Date of birth: 08-19-1932 Age: 89 y.o. Gender: male  Primary Care Provider: Joshua Francisco, MD Consultants: None Code Status: FULL  Pt Overview and Major Events to Date:  - 9/19: Admitted  Medical Decision Making:  [name] [name] is a 89 y.o. male with PMH significant for A fib not on anticoagulation, HFpEF, iron deficiency anemia, PVD s/p intervention, HTN, hypothyroidism, prostate CA, BPH admitted for weakness and bradycardia. Orthostatic vitals with drop from 143/84 lying to 119/72 standing (HR 57 to 73). Adding halved dose of lisinopril  back on (home dose of 10 mg, starting on 5 mg here).  Echo done today, results pending. Telemetry reviewed and patient in A-fib, known, with occasional episodes of bradycardia cardia into the high 30s.  Asymptomatic currently and with heart rate in the 60s. Assessment & Plan Bradycardia Atrial fibrillation (HCC) Currently asymptomatic. - Repeat echo pending - PT/OT unable to see patient this morning given he was at echo, reached out to social work to see if this eval is needed prior to sending patient back to SNF - Fall and delirium precautions in place - TOC consulted for placement back to [facility] Anemia Patient has hx of iron-deficiency anemia. Fe low at 18. - Discharge patient on ferrous sulfate  325 mg every other day UTI (urinary tract infection) Patient w/ hx of BPH, prostate CA. Was seen by urology recently and found to have UTI and started on antibiotics. UA this admission without evidence of UTI; will finish previously prescribed abx course. - Nitrofurantoin  100 mg BID for UTI per Urology (9/16-9/23) Hematuria Hematuria several weeks ago, no currently issues; undergoing outpatient follow-up with urology. - Continue to follow outpatient Chronic health problem PAD - s/p revascularization for critical limb ischemia of LLE in  01/2024; continue atorvastatin  40 mg daily, ASA 81 mg daily, Plavix  75 mg daily; unclear how long patient to continue DOAC Chronic HFpeF - Last echo in 11/2022 EF 60-65%.  Repeat pending today HTN - starting on reduced dose of lisinopril  at 5 mg daily given orthostatics Hypothyroidism - continue home synthroid  125 mcg daily; TSH within range (0.788). BPH - continue home tamsulosin  0.4 mg daily  FEN/GI: Regular PPx: Lovenox  Dispo: Back to [facility] pending PT/OT eval, continued workup (orthostatic vitals, echo)  Subjective:  Doing well overall, no concerns.  Seen while eating lunch.  Denies any dizziness, lightheadedness, presyncope.  Talked with patient about changes were making to his blood pressure regimen.  Also cautioned patient to take his time when first getting up and to use his walker.  Objective: Temp:  [97.5 F (36.4 C)-97.9 F (36.6 C)] 97.6 F (36.4 C) (09/20 0411) Pulse Rate:  [38-99] 81 (09/20 0411) Resp:  [15-19] 16 (09/20 0411) BP: (111-159)/(60-96) 131/87 (09/20 0411) SpO2:  [98 %-100 %] 100 % (09/20 0411) FiO2 (%):  [21 %] 21 % (09/19 2014) Weight:  [82.7 kg-129 kg] 82.7 kg (09/19 2200)  Orthostatic VS for the past 24 hrs:  BP- Lying Pulse- Lying BP- Sitting Pulse- Sitting BP- Standing at 0 minutes Pulse- Standing at 0 minutes  07/21/24 1052 (P) 143/84 (P) 50 (P) 130/82 (P) 57 (P) 119/72 (P) 73   Physical Exam: General: Patient seated in recliner eating lunch, no acute distress. Cardiovascular: Irregularly irregular, no bradycardia, no murmurs/rubs/gallops. Respiratory: Normal work of breathing on room air. Clear to auscultation bilaterally; no wheezes, crackles. Abdomen: Bowel sounds present and normoactive bilaterally.  Soft, nondistended, nontender. Extremities: Skin warm, dry. No bilateral lower extremity edema.  Laboratory: Most recent CBC Lab Results  Component Value Date   WBC 4.1 07/20/2024   HGB 10.7 (L) 07/20/2024   HCT 32.4 (L) 07/20/2024    MCV 93.1 07/20/2024   PLT 134 (L) 07/20/2024   Most recent BMP    Latest Ref Rng & Units 07/21/2024    5:57 AM  BMP  Glucose 70 - 99 mg/dL 890   BUN 8 - 23 mg/dL 14   Creatinine 9.38 - 1.24 mg/dL 8.99   Sodium 864 - 854 mmol/L 139   Potassium 3.5 - 5.1 mmol/L 4.2   Chloride 98 - 111 mmol/L 111   CO2 22 - 32 mmol/L 21   Calcium  8.9 - 10.3 mg/dL 8.8    Iron/TIBC/Ferritin/ %Sat    Component Value Date/Time   IRON 52 07/21/2024 0557   TIBC 314 07/21/2024 0557   FERRITIN 18 (L) 07/21/2024 0557   IRONPCTSAT 17 (L) 07/21/2024 0557   Urinalysis: No evidence of UTI  Imaging/Diagnostic Tests: 9/20 Echo: Pending  Larraine Palma, MD 07/21/2024, 7:31 AM  PGY-1, [place] [facility] FPTS Intern pager: [phone], text pages welcome Secure chat group [facility] Teaching Service

## 2024-07-21 NOTE — Assessment & Plan Note (Signed)
 Hematuria several weeks ago, no currently issues; undergoing outpatient follow-up with urology. - Continue to follow outpatient

## 2024-07-21 NOTE — Plan of Care (Signed)

## 2024-07-21 NOTE — Progress Notes (Signed)
 PT Cancellation Note  Patient Details Name: [name] [name] MRN: [id number] DOB: 01-26-1932   Cancelled Treatment:    Reason Eval/Treat Not Completed: Patient at procedure or test/unavailable  Aleck Daring, PT, DPT Acute Rehabilitation Services Office [phone]    [name] 07/21/2024, 9:39 AM

## 2024-07-21 NOTE — Care Management Obs Status (Signed)
 MEDICARE OBSERVATION STATUS NOTIFICATION   Patient Details  Name: [name] [name] MRN: [id number] Date of Birth: 01/15/1932   Medicare Observation Status Notification Given:  Yes    Jon Cruel 07/21/2024, 10:50 AM

## 2024-07-21 NOTE — Progress Notes (Signed)
  Echocardiogram 2D Echocardiogram has been performed.  Daimien Patmon 07/21/2024, 9:45 AM

## 2024-07-21 NOTE — Assessment & Plan Note (Addendum)
 PAD - s/p revascularization for critical limb ischemia of LLE in 01/2024; continue atorvastatin  40 mg daily, ASA 81 mg daily, Plavix  75 mg daily; unclear how long patient to continue DOAC Chronic HFpeF - Last echo in 11/2022 EF 60-65%.  Repeat pending today HTN - starting on reduced dose of lisinopril  at 5 mg daily given orthostatics Hypothyroidism - continue home synthroid  125 mcg daily; TSH within range (0.788). BPH - continue home tamsulosin  0.4 mg daily

## 2024-07-25 ENCOUNTER — Ambulatory Visit (INDEPENDENT_AMBULATORY_CARE_PROVIDER_SITE_OTHER): Payer: Self-pay | Admitting: Podiatry

## 2024-07-25 DIAGNOSIS — Z91199 Patient's noncompliance with other medical treatment and regimen due to unspecified reason: Secondary | ICD-10-CM

## 2024-07-25 NOTE — Progress Notes (Signed)
 No show

## 2024-08-07 ENCOUNTER — Other Ambulatory Visit: Payer: Self-pay

## 2024-08-07 ENCOUNTER — Emergency Department (HOSPITAL_COMMUNITY)
Admission: EM | Admit: 2024-08-07 | Discharge: 2024-08-07 | Disposition: A | Discharge status: Home / Self Care | Attending: Emergency Medicine | Admitting: Emergency Medicine

## 2024-08-07 ENCOUNTER — Encounter (HOSPITAL_COMMUNITY): Payer: Self-pay

## 2024-08-07 DIAGNOSIS — Z7982 Long term (current) use of aspirin: Secondary | ICD-10-CM | POA: Diagnosis not present

## 2024-08-07 DIAGNOSIS — I110 Hypertensive heart disease with heart failure: Secondary | ICD-10-CM | POA: Insufficient documentation

## 2024-08-07 DIAGNOSIS — Z8546 Personal history of malignant neoplasm of prostate: Secondary | ICD-10-CM | POA: Diagnosis not present

## 2024-08-07 DIAGNOSIS — I5032 Chronic diastolic (congestive) heart failure: Secondary | ICD-10-CM | POA: Diagnosis not present

## 2024-08-07 DIAGNOSIS — Z79899 Other long term (current) drug therapy: Secondary | ICD-10-CM | POA: Insufficient documentation

## 2024-08-07 DIAGNOSIS — E039 Hypothyroidism, unspecified: Secondary | ICD-10-CM | POA: Insufficient documentation

## 2024-08-07 DIAGNOSIS — I959 Hypotension, unspecified: Secondary | ICD-10-CM | POA: Diagnosis present

## 2024-08-07 DIAGNOSIS — E860 Dehydration: Secondary | ICD-10-CM

## 2024-08-07 LAB — URINALYSIS, W/ REFLEX TO CULTURE (INFECTION SUSPECTED)
Bacteria, UA: NONE SEEN
Bilirubin Urine: NEGATIVE
Glucose, UA: NEGATIVE mg/dL
Hgb urine dipstick: NEGATIVE
Ketones, ur: NEGATIVE mg/dL
Leukocytes,Ua: NEGATIVE
Nitrite: NEGATIVE
Protein, ur: NEGATIVE mg/dL
Specific Gravity, Urine: 1.011 (ref 1.005–1.030)
pH: 5 (ref 5.0–8.0)

## 2024-08-07 LAB — CBC WITH DIFFERENTIAL/PLATELET
Abs Immature Granulocytes: 0.01 K/uL (ref 0.00–0.07)
Basophils Absolute: 0 K/uL (ref 0.0–0.1)
Basophils Relative: 1 %
Eosinophils Absolute: 0.1 K/uL (ref 0.0–0.5)
Eosinophils Relative: 3 %
HCT: 41.6 % (ref 39.0–52.0)
Hemoglobin: 13 g/dL (ref 13.0–17.0)
Immature Granulocytes: 0 %
Lymphocytes Relative: 37 %
Lymphs Abs: 1.5 K/uL (ref 0.7–4.0)
MCH: 29.4 pg (ref 26.0–34.0)
MCHC: 31.3 g/dL (ref 30.0–36.0)
MCV: 94.1 fL (ref 80.0–100.0)
Monocytes Absolute: 0.5 K/uL (ref 0.1–1.0)
Monocytes Relative: 13 %
Neutro Abs: 1.8 K/uL (ref 1.7–7.7)
Neutrophils Relative %: 46 %
Platelets: 191 K/uL (ref 150–400)
RBC: 4.42 MIL/uL (ref 4.22–5.81)
RDW: 13.2 % (ref 11.5–15.5)
WBC: 4 K/uL (ref 4.0–10.5)
nRBC: 0 % (ref 0.0–0.2)

## 2024-08-07 LAB — COMPREHENSIVE METABOLIC PANEL WITH GFR
ALT: 17 U/L (ref 0–44)
AST: 27 U/L (ref 15–41)
Albumin: 3.6 g/dL (ref 3.5–5.0)
Alkaline Phosphatase: 84 U/L (ref 38–126)
Anion gap: 8 (ref 5–15)
BUN: 21 mg/dL (ref 8–23)
CO2: 23 mmol/L (ref 22–32)
Calcium: 9.1 mg/dL (ref 8.9–10.3)
Chloride: 108 mmol/L (ref 98–111)
Creatinine, Ser: 1.39 mg/dL — ABNORMAL HIGH (ref 0.61–1.24)
GFR, Estimated: 48 mL/min — ABNORMAL LOW (ref >60)
Glucose, Bld: 79 mg/dL (ref 70–99)
Potassium: 4.7 mmol/L (ref 3.5–5.1)
Sodium: 139 mmol/L (ref 135–145)
Total Bilirubin: 1.3 mg/dL — ABNORMAL HIGH (ref 0.0–1.2)
Total Protein: 6.4 g/dL — ABNORMAL LOW (ref 6.5–8.1)

## 2024-08-07 LAB — I-STAT CG4 LACTIC ACID, ED: Lactic Acid, Venous: 1.2 mmol/L (ref 0.5–1.9)

## 2024-08-07 MED ORDER — SODIUM CHLORIDE 0.9 % IV BOLUS
1000.0000 mL | Freq: Once | INTRAVENOUS | Status: AC
  Administered 2024-08-07: 1000 mL via INTRAVENOUS

## 2024-08-07 NOTE — ED Triage Notes (Signed)
 Pt to er room number 24 via ems, per ems pt has been having uti like symptoms with urinary pain and frequency, states that he is here today for a low blood pressure and lethargy.  Pt awake, pt oriented to person, place, pt doesn't know the day of the week, re oriented pt.

## 2024-08-07 NOTE — ED Notes (Signed)
 [facility] called and made aware of need for transportation back to [facility].   Patient placed on list at this time

## 2024-08-07 NOTE — ED Provider Notes (Signed)
 [place] EMERGENCY DEPARTMENT AT [facility] Provider Note   CSN: [id number] Arrival date & time: 08/07/24  1434     Patient presents with: Hypotension   [name] [name] is a 89 y.o. male.   HPI Patient here with reported UTI symptoms and low blood pressure.  Patient states he has had some burning.  Reviewing notes it appears similar admission recently with bradycardia thought to be secondary to dehydration and maybe some medicines.  Also bradycardia with A-fib which appears to be chronic.  Did have recent TSH checked that was reassuring.  Does have apparent history of dementia.   Past Medical History:  Diagnosis Date   B12 deficiency    Chronic heart failure with preserved ejection fraction (HFpEF) (HCC)    Falls    Hypertension    Hypothyroidism    Iron deficiency anemia    Peripheral vascular disease    Prostate cancer (HCC)    Trifascicular block     Prior to Admission medications   Medication Sig Start Date End Date Taking? Authorizing Provider  acetaminophen  (TYLENOL ) 500 MG tablet Take 500 mg by mouth every 6 (six) hours as needed for mild pain (pain score 1-3) or moderate pain (pain score 4-6).    [provider]  aspirin  EC 81 MG tablet Take 81 mg by mouth daily. Swallow whole.    [provider]  atorvastatin  (LIPITOR) 40 MG tablet Take 1 tablet (40 mg total) by mouth daily. 04/25/24 04/25/25  Lanis Fonda BRAVO, MD  brexpiprazole  (REXULTI ) 2 MG TABS tablet Take 2 mg by mouth at bedtime.    [provider]  clopidogrel  (PLAVIX ) 75 MG tablet Take 1 tablet (75 mg total) by mouth daily. 04/25/24 04/25/25  Robins, Joshua E, MD  DULoxetine  (CYMBALTA ) 20 MG capsule Take 20 mg by mouth daily.    [provider]  ferrous sulfate  325 (65 FE) MG tablet Take 1 tablet (325 mg total) by mouth every other day. 07/23/24   Gomes, Adriana, DO  levothyroxine  (SYNTHROID ) 125 MCG tablet Take 125 mcg by mouth daily before breakfast.    [provider]  lidocaine  4 % Place 1 patch onto the skin daily.    [provider]  lisinopril  (ZESTRIL ) 10 MG tablet Take 0.5 tablets (5 mg total) by mouth daily. 07/21/24   Gomes, Adriana, DO  melatonin 5 MG TABS Take 5 mg by mouth at bedtime.    [provider]  polyethylene glycol (MIRALAX / GLYCOLAX) 17 g packet Take 17 g by mouth daily.    [provider]  tamsulosin  (FLOMAX ) 0.4 MG CAPS capsule Take 0.4 mg by mouth daily. 09/24/19   [provider]    Allergies: Sildenafil    Review of Systems  Updated Vital Signs BP (!) 113/56 (BP Location: Left Arm)   Pulse (!) 58   Temp (!) 97.5 F (36.4 C) (Oral)   Resp 16   Ht 6' 4 (1.93 m)   Wt 82.6 kg   SpO2 100%   BMI 22.15 kg/m   Physical Exam Vitals and nursing note reviewed.  HENT:     Head: Normocephalic.  Cardiovascular:     Rate and Rhythm: Regular rhythm. Bradycardia present.  Pulmonary:     Breath sounds: No wheezing.  Abdominal:     Tenderness: There is no abdominal tenderness.  Musculoskeletal:     Cervical back: Neck supple.     Right lower leg: Edema present.     Left  lower leg: Edema present.  Skin:    General: Skin is warm.     Capillary Refill: Capillary refill takes more than 3 seconds.  Neurological:     Mental Status: He is alert. Mental status is at baseline.     (all labs ordered are listed, but only abnormal results are displayed) Labs Reviewed  CULTURE, BLOOD (ROUTINE X 2)  CULTURE, BLOOD (ROUTINE X 2)  CBC WITH DIFFERENTIAL/PLATELET  URINALYSIS, W/ REFLEX TO CULTURE (INFECTION SUSPECTED)  COMPREHENSIVE METABOLIC PANEL WITH GFR  I-STAT CG4 LACTIC ACID, ED    EKG: EKG Interpretation Date/Time:  Tuesday August 07 2024 14:51:14 EDT Ventricular Rate:  46 PR Interval:    QRS Duration:  148 QT Interval:  484 QTC Calculation: 424 R Axis:   -53  Text Interpretation: Atrial fibrillation Ventricular premature complex RBBB and LAFB Confirmed by Patsey Lot [phone]) on 08/07/2024 3:50:27 PM  Radiology: No results found.   Procedures   Medications Ordered in the ED  sodium chloride  0.9 % bolus 1,000 mL (has no administration in time range)                                    Medical Decision Making Amount and/or Complexity of Data Reviewed Labs: ordered.   Patient with reported hypotension.  Mild hypotension here.  Also bradycardia which appears somewhat chronic.  Did have relatively recent TSH checked.  Differential diagnosis does include dehydration, UTI.  Will give fluid boluses.  Get blood work and urine sample.  Reviewed recent discharge note.     Final diagnoses:  None    ED Discharge Orders     None          Patsey Lot, MD 08/07/24 1552

## 2024-08-07 NOTE — ED Notes (Signed)
 Patient made aware of plan to discharge back to [facility].  Understanding voiced at this time

## 2024-08-07 NOTE — ED Notes (Signed)
 Attempted to call report to [facility].  Phone not answered at this time

## 2024-08-07 NOTE — ED Notes (Signed)
 Patient attempting to provide urine sample at this time.

## 2024-08-12 LAB — CULTURE, BLOOD (ROUTINE X 2)
Culture: NO GROWTH
Culture: NO GROWTH
Special Requests: ADEQUATE
Special Requests: ADEQUATE

## 2024-09-06 ENCOUNTER — Encounter: Payer: Self-pay | Admitting: Physician Assistant

## 2024-09-19 NOTE — Progress Notes (Signed)
 Cardiology Office Note:  .   Date:  09/21/2024  ID:  Helayne GORMAN Fluke, DOB 1931/12/21, MRN [id number] PCP: Joshua Francisco, MD  [facility] Providers Cardiologist:  Darryle ONEIDA Decent, MD  History of Present Illness: .    Chief Complaint  Patient presents with   Follow-up         KRISTEN BUSHWAY is a 89 y.o. male with history of Afib, HFpEF, HTN, HLD, PAD who presents for follow-up.    History of Present Illness   Sr. NOBORU BIDINGER is a 89 year old male with atrial fibrillation and peripheral artery disease who presents for follow-up. He is accompanied by his niece and nephew, who are his primary caregivers.  Since the last visit, he has been diagnosed with peripheral artery disease and underwent a peripheral intervention in June due to concerns of low blood flow in his legs. He was treated with a balloon angioplasty and is currently on aspirin  and Plavix  post-procedure. His caregivers were not aware of this treatment.  He has also been diagnosed with atrial fibrillation with a slow ventricular response. He is not currently on a blood thinner for atrial fibrillation, and his caregivers are unsure why. He has a history of bleeding and was hospitalized for it a few months ago. He was previously on Eliquis , which was discontinued, possibly due to falls. He has had a couple of falls recently.  No chest pain, trouble breathing, or swelling in his legs. He is currently on lisinopril  5 mg daily for hypertension and Lipitor 40 mg daily for peripheral artery disease.  He resides at [facility], possibly permanently, and is not very active, only walking up and down the hall. His family is not involved in his care, and his niece and nephew are his primary support.          Problem List 1. HFpEF -EF 55-60%, elevated BNP -Normal MPI 11/2019 2.  Trifascicular block -ETT with increase in HR to 81% MPHR 02/06/2020 3. HTN 4. CKD 3a 5. Diabetes  6. PAD -CLI s/p L TP/PT/peroneal angioplasty  7.  Afib with SVR -Dx 11/2022    ROS: All other ROS reviewed and negative. Pertinent positives noted in the HPI.     Studies Reviewed: SABRA       TTE 07/21/2024  1. Left ventricular ejection fraction, by estimation, is 55 to 60%. The  left ventricle has normal function. The left ventricle has no regional  wall motion abnormalities. There is mild concentric left ventricular  hypertrophy. Left ventricular diastolic  function could not be evaluated.   2. Right ventricular systolic function is normal. The right ventricular  size is normal. There is normal pulmonary artery systolic pressure.   3. The mitral valve was not well visualized. Trivial mitral valve  regurgitation. No evidence of mitral stenosis.   4. Calcified noncoronary cusp leaflet. The aortic valve is tricuspid.  There is moderate calcification of the aortic valve. Aortic valve  regurgitation is trivial.   5. There are multiple PI jets.  Physical Exam:   VS:  BP 139/74 (BP Location: Left Arm, Patient Position: Sitting, Cuff Size: Normal)   Pulse (!) 51   Resp 17   Ht 6' 4 (1.93 m)   Wt 164 lb (74.4 kg)   SpO2 99%   BMI 19.96 kg/m    Wt Readings from Last 3 Encounters:  09/21/24 164 lb (74.4 kg)  08/07/24 182 lb (82.6 kg)  07/20/24 182 lb 6.4 oz (82.7 kg)  GEN: Well nourished, well developed in no acute distress NECK: No JVD; No carotid bruits CARDIAC: irregular rhythm, no murmurs, rubs, gallops RESPIRATORY:  Clear to auscultation without rales, wheezing or rhonchi  ABDOMEN: Soft, non-tender, non-distended EXTREMITIES:  No edema; No deformity  ASSESSMENT AND PLAN: .   Assessment and Plan    Atrial fibrillation with slow ventricular response Atrial fibrillation with slow ventricular response, heart rate 62 bpm. EKG shows right bundle branch block, likely aberrant conduction. Suspect conduction disease without complete heart block. Transitioned to Eliquis  for stroke prevention. - Stopped aspirin  and Plavix . - Started  Eliquis  5 mg BID based on kidney function and weight. - Checked BMET and CBC today.  Peripheral artery disease status post lower extremity angioplasty Peripheral artery disease with critical ischemia, status post tibial, TP, and PT peroneal angioplasty. Kidney function stable. - Continue Lipitor 40 mg daily. - Rechecked labs today.  Heart failure with preserved ejection fraction Heart failure with preserved ejection fraction. Fluid levels well-managed. - Continue to monitor fluid status.  Chronic kidney disease stage 3a Chronic kidney disease stage 3a. Kidney function stable. - Rechecked labs today.  Hypertension Hypertension, currently managed with lisinopril . - Continue lisinopril  5 mg daily.  Hyperlipidemia Hyperlipidemia, managed with Lipitor. - Continue Lipitor 40 mg daily.              Follow-up: Return in about 6 months (around 03/21/2025).  Signed, Darryle DASEN. Barbaraann, MD, [facility]  [facility]  [address] [place], [place] [address] [phone]  2:40 PM

## 2024-09-21 ENCOUNTER — Ambulatory Visit: Attending: Cardiovascular Disease | Admitting: Cardiovascular Disease

## 2024-09-21 ENCOUNTER — Other Ambulatory Visit (HOSPITAL_COMMUNITY): Payer: Self-pay

## 2024-09-21 ENCOUNTER — Encounter: Payer: Self-pay | Admitting: Cardiovascular Disease

## 2024-09-21 VITALS — BP 139/74 | HR 51 | Resp 17 | Ht 76.0 in | Wt 164.0 lb

## 2024-09-21 DIAGNOSIS — E782 Mixed hyperlipidemia: Secondary | ICD-10-CM

## 2024-09-21 DIAGNOSIS — I5032 Chronic diastolic (congestive) heart failure: Secondary | ICD-10-CM | POA: Diagnosis not present

## 2024-09-21 DIAGNOSIS — I4819 Other persistent atrial fibrillation: Secondary | ICD-10-CM | POA: Diagnosis not present

## 2024-09-21 DIAGNOSIS — I739 Peripheral vascular disease, unspecified: Secondary | ICD-10-CM | POA: Diagnosis not present

## 2024-09-21 DIAGNOSIS — I150 Renovascular hypertension: Secondary | ICD-10-CM

## 2024-09-21 MED ORDER — APIXABAN 5 MG PO TABS
5.0000 mg | ORAL_TABLET | Freq: Two times a day (BID) | ORAL | 3 refills | Status: DC
  Filled 2024-09-21: qty 180, 90d supply, fill #0

## 2024-09-21 NOTE — Patient Instructions (Signed)
 Medication Instructions:  Stop Aspirin  Stop Plavix  Start Eliquis  5 mg twice a day Continue all other medications *If you need a refill on your cardiac medications before your next appointment, please call your pharmacy*  Lab Work: Bmet,cbc today  Testing/Procedures: None ordered  Follow-Up: At [facility], you and your health needs are our priority.  As part of our continuing mission to provide you with exceptional heart care, our providers are all part of one team.  This team includes your primary Cardiologist (physician) and Advanced Practice Providers or APPs (Physician Assistants and Nurse Practitioners) who all work together to provide you with the care you need, when you need it.  Your next appointment:  6 months   Call in Feb to schedule May appointment     Provider:  Dr.O'Neal   We recommend signing up for the patient portal called MyChart.  Sign up information is provided on this After Visit Summary.  MyChart is used to connect with patients for Virtual Visits (Telemedicine).  Patients are able to view lab/test results, encounter notes, upcoming appointments, etc.  Non-urgent messages can be sent to your provider as well.   To learn more about what you can do with MyChart, go to [email or web address].

## 2024-09-22 LAB — CBC WITH DIFFERENTIAL/PLATELET
Basophils Absolute: 0 x10E3/uL (ref 0.0–0.2)
Basos: 1 %
EOS (ABSOLUTE): 0.1 x10E3/uL (ref 0.0–0.4)
Eos: 3 %
Hematocrit: 44.6 % (ref 37.5–51.0)
Hemoglobin: 14.8 g/dL (ref 13.0–17.7)
Immature Grans (Abs): 0 x10E3/uL (ref 0.0–0.1)
Immature Granulocytes: 0 %
Lymphocytes Absolute: 1.9 x10E3/uL (ref 0.7–3.1)
Lymphs: 44 %
MCH: 30.6 pg (ref 26.6–33.0)
MCHC: 33.2 g/dL (ref 31.5–35.7)
MCV: 92 fL (ref 79–97)
Monocytes Absolute: 0.5 x10E3/uL (ref 0.1–0.9)
Monocytes: 11 %
Neutrophils Absolute: 1.8 x10E3/uL (ref 1.4–7.0)
Neutrophils: 41 %
Platelets: 186 x10E3/uL (ref 150–450)
RBC: 4.84 x10E6/uL (ref 4.14–5.80)
RDW: 12.8 % (ref 11.6–15.4)
WBC: 4.2 x10E3/uL (ref 3.4–10.8)

## 2024-09-22 LAB — BASIC METABOLIC PANEL WITH GFR
BUN/Creatinine Ratio: 14 (ref 10–24)
BUN: 17 mg/dL (ref 10–36)
CO2: 21 mmol/L (ref 20–29)
Calcium: 9.2 mg/dL (ref 8.6–10.2)
Chloride: 101 mmol/L (ref 96–106)
Creatinine, Ser: 1.25 mg/dL (ref 0.76–1.27)
Glucose: 87 mg/dL (ref 70–99)
Potassium: 4.4 mmol/L (ref 3.5–5.2)
Sodium: 138 mmol/L (ref 134–144)
eGFR: 55 mL/min/1.73 — AB (ref >59)

## 2024-09-23 ENCOUNTER — Ambulatory Visit: Payer: Self-pay | Admitting: Cardiovascular Disease

## 2024-09-24 ENCOUNTER — Telehealth: Payer: Self-pay | Admitting: Cardiovascular Disease

## 2024-09-24 MED ORDER — ASPIRIN 81 MG PO TBEC
81.0000 mg | DELAYED_RELEASE_TABLET | Freq: Every day | ORAL | Status: AC
Start: 2024-09-24 — End: ?

## 2024-09-24 NOTE — Telephone Encounter (Signed)
  Elspeth called back and said his call is more of an FYI. If there are any changes to the patient's medication or plan of care, feel free to call him back.

## 2024-09-24 NOTE — Telephone Encounter (Signed)
 Left message to call back to discuss with triage nurse.

## 2024-09-24 NOTE — Telephone Encounter (Signed)
 fyi

## 2024-09-24 NOTE — Telephone Encounter (Signed)
 Pt c/o medication issue:  1. Name of Medication:   apixaban  (ELIQUIS ) 5 MG TABS tablet    2. How are you currently taking this medication (dosage and times per day)?    3. Are you having a reaction (difficulty breathing--STAT)? no  4. What is your medication issue? Patient was prev on medication but had stop because of multiple falls. Want to make dr aware of it. Please advise

## 2024-09-24 NOTE — Telephone Encounter (Signed)
 OK, if he is off then he should continue ASA 81 mg daily instead. No plavix .    Signed, Darryle DASEN. Barbaraann, MD, [facility]  [facility]  [address] [place], [place] [address] [phone]  11:37 AM  S/w Elspeth at [facility]. Gave him the information above. He verbalized understanding and will start the patient on Aspirin  81 mg.  Med list updated

## 2024-10-05 ENCOUNTER — Observation Stay (HOSPITAL_COMMUNITY)

## 2024-10-05 ENCOUNTER — Emergency Department (HOSPITAL_COMMUNITY)

## 2024-10-05 ENCOUNTER — Observation Stay (HOSPITAL_COMMUNITY)
Admission: EM | Admit: 2024-10-05 | Discharge: 2024-10-06 | Disposition: A | Source: Skilled Nursing Facility | Attending: Internal Medicine | Admitting: Internal Medicine

## 2024-10-05 ENCOUNTER — Other Ambulatory Visit: Payer: Self-pay

## 2024-10-05 DIAGNOSIS — M869 Osteomyelitis, unspecified: Secondary | ICD-10-CM | POA: Insufficient documentation

## 2024-10-05 DIAGNOSIS — M86172 Other acute osteomyelitis, left ankle and foot: Secondary | ICD-10-CM | POA: Insufficient documentation

## 2024-10-05 DIAGNOSIS — R509 Fever, unspecified: Principal | ICD-10-CM | POA: Diagnosis present

## 2024-10-05 DIAGNOSIS — F0392 Unspecified dementia, unspecified severity, with psychotic disturbance: Secondary | ICD-10-CM | POA: Insufficient documentation

## 2024-10-05 DIAGNOSIS — E872 Acidosis, unspecified: Secondary | ICD-10-CM | POA: Insufficient documentation

## 2024-10-05 DIAGNOSIS — I739 Peripheral vascular disease, unspecified: Secondary | ICD-10-CM | POA: Diagnosis present

## 2024-10-05 DIAGNOSIS — Z79899 Other long term (current) drug therapy: Secondary | ICD-10-CM | POA: Insufficient documentation

## 2024-10-05 DIAGNOSIS — E039 Hypothyroidism, unspecified: Secondary | ICD-10-CM | POA: Diagnosis present

## 2024-10-05 DIAGNOSIS — Z7982 Long term (current) use of aspirin: Secondary | ICD-10-CM | POA: Insufficient documentation

## 2024-10-05 DIAGNOSIS — L089 Local infection of the skin and subcutaneous tissue, unspecified: Secondary | ICD-10-CM

## 2024-10-05 DIAGNOSIS — I4891 Unspecified atrial fibrillation: Secondary | ICD-10-CM | POA: Diagnosis present

## 2024-10-05 DIAGNOSIS — I10 Essential (primary) hypertension: Secondary | ICD-10-CM | POA: Insufficient documentation

## 2024-10-05 DIAGNOSIS — C61 Malignant neoplasm of prostate: Secondary | ICD-10-CM | POA: Diagnosis present

## 2024-10-05 LAB — CBC
HCT: 48.4 % (ref 39.0–52.0)
HCT: 46 % (ref 39.0–52.0)
Hemoglobin: 16 g/dL (ref 13.0–17.0)
Hemoglobin: 15.3 g/dL (ref 13.0–17.0)
MCH: 30.3 pg (ref 26.0–34.0)
MCH: 30.5 pg (ref 26.0–34.0)
MCHC: 33.1 g/dL (ref 30.0–36.0)
MCHC: 33.3 g/dL (ref 30.0–36.0)
MCV: 91.7 fL (ref 80.0–100.0)
MCV: 91.6 fL (ref 80.0–100.0)
Platelets: 143 K/uL — ABNORMAL LOW (ref 150–400)
Platelets: 126 K/uL — ABNORMAL LOW (ref 150–400)
RBC: 5.28 MIL/uL (ref 4.22–5.81)
RBC: 5.02 MIL/uL (ref 4.22–5.81)
RDW: 13.6 % (ref 11.5–15.5)
RDW: 13.7 % (ref 11.5–15.5)
WBC: 5.5 K/uL (ref 4.0–10.5)
WBC: 6.8 K/uL (ref 4.0–10.5)
nRBC: 0 % (ref 0.0–0.2)
nRBC: 0 % (ref 0.0–0.2)

## 2024-10-05 LAB — LACTIC ACID, PLASMA: Lactic Acid, Venous: 3 mmol/L (ref 0.5–1.9)

## 2024-10-05 LAB — RESP PANEL BY RT-PCR (RSV, FLU A&B, COVID)  RVPGX2
Influenza A by PCR: NEGATIVE
Influenza B by PCR: NEGATIVE
Resp Syncytial Virus by PCR: NEGATIVE
SARS Coronavirus 2 by RT PCR: NEGATIVE

## 2024-10-05 LAB — I-STAT CHEM 8, ED
BUN: 15 mg/dL (ref 8–23)
Calcium, Ion: 1.18 mmol/L (ref 1.15–1.40)
Chloride: 101 mmol/L (ref 98–111)
Creatinine, Ser: 1.3 mg/dL — ABNORMAL HIGH (ref 0.61–1.24)
Glucose, Bld: 107 mg/dL — ABNORMAL HIGH (ref 70–99)
HCT: 50 % (ref 39.0–52.0)
Hemoglobin: 17 g/dL (ref 13.0–17.0)
Potassium: 4.2 mmol/L (ref 3.5–5.1)
Sodium: 139 mmol/L (ref 135–145)
TCO2: 24 mmol/L (ref 22–32)

## 2024-10-05 LAB — SEDIMENTATION RATE: Sed Rate: 3 mm/h (ref 0–16)

## 2024-10-05 LAB — I-STAT VENOUS BLOOD GAS, ED
Acid-base deficit: 1 mmol/L (ref 0.0–2.0)
Bicarbonate: 24 mmol/L (ref 20.0–28.0)
Calcium, Ion: 1.1 mmol/L — ABNORMAL LOW (ref 1.15–1.40)
HCT: 41 % (ref 39.0–52.0)
Hemoglobin: 13.9 g/dL (ref 13.0–17.0)
O2 Saturation: 84 %
Potassium: 4.3 mmol/L (ref 3.5–5.1)
Sodium: 136 mmol/L (ref 135–145)
TCO2: 25 mmol/L (ref 22–32)
pCO2, Ven: 38.6 mmHg — ABNORMAL LOW (ref 44–60)
pH, Ven: 7.402 (ref 7.25–7.43)
pO2, Ven: 49 mmHg — ABNORMAL HIGH (ref 32–45)

## 2024-10-05 LAB — COMPREHENSIVE METABOLIC PANEL WITH GFR
ALT: 28 U/L (ref 0–44)
AST: 36 U/L (ref 15–41)
Albumin: 3.5 g/dL (ref 3.5–5.0)
Alkaline Phosphatase: 72 U/L (ref 38–126)
Anion gap: 7 (ref 5–15)
BUN: 14 mg/dL (ref 8–23)
CO2: 27 mmol/L (ref 22–32)
Calcium: 9 mg/dL (ref 8.9–10.3)
Chloride: 102 mmol/L (ref 98–111)
Creatinine, Ser: 1.31 mg/dL — ABNORMAL HIGH (ref 0.61–1.24)
GFR, Estimated: 52 mL/min — ABNORMAL LOW (ref >60)
Glucose, Bld: 103 mg/dL — ABNORMAL HIGH (ref 70–99)
Potassium: 4.2 mmol/L (ref 3.5–5.1)
Sodium: 136 mmol/L (ref 135–145)
Total Bilirubin: 2.4 mg/dL — ABNORMAL HIGH (ref 0.0–1.2)
Total Protein: 7 g/dL (ref 6.5–8.1)

## 2024-10-05 LAB — BASIC METABOLIC PANEL WITH GFR
Anion gap: 13 (ref 5–15)
BUN: 11 mg/dL (ref 8–23)
CO2: 21 mmol/L — ABNORMAL LOW (ref 22–32)
Calcium: 8.7 mg/dL — ABNORMAL LOW (ref 8.9–10.3)
Chloride: 101 mmol/L (ref 98–111)
Creatinine, Ser: 1.22 mg/dL (ref 0.61–1.24)
GFR, Estimated: 56 mL/min — ABNORMAL LOW (ref >60)
Glucose, Bld: 158 mg/dL — ABNORMAL HIGH (ref 70–99)
Potassium: 4.3 mmol/L (ref 3.5–5.1)
Sodium: 135 mmol/L (ref 135–145)

## 2024-10-05 LAB — TSH: TSH: 1.292 u[IU]/mL (ref 0.350–4.500)

## 2024-10-05 LAB — I-STAT CG4 LACTIC ACID, ED
Lactic Acid, Venous: 2.4 mmol/L (ref 0.5–1.9)
Lactic Acid, Venous: 2.5 mmol/L (ref 0.5–1.9)
Lactic Acid, Venous: 4 mmol/L (ref 0.5–1.9)
Lactic Acid, Venous: 4.2 mmol/L (ref 0.5–1.9)

## 2024-10-05 LAB — MRSA NEXT GEN BY PCR, NASAL: MRSA by PCR Next Gen: NOT DETECTED

## 2024-10-05 LAB — URINALYSIS, MICROSCOPIC (REFLEX)

## 2024-10-05 LAB — URINALYSIS, ROUTINE W REFLEX MICROSCOPIC
Bilirubin Urine: NEGATIVE
Glucose, UA: NEGATIVE mg/dL
Ketones, ur: NEGATIVE mg/dL
Leukocytes,Ua: NEGATIVE
Nitrite: NEGATIVE
Protein, ur: NEGATIVE mg/dL
Specific Gravity, Urine: 1.015 (ref 1.005–1.030)
pH: 6 (ref 5.0–8.0)

## 2024-10-05 LAB — C-REACTIVE PROTEIN: CRP: 3.8 mg/dL — ABNORMAL HIGH (ref <1.0)

## 2024-10-05 LAB — PROTIME-INR
INR: 1.5 — ABNORMAL HIGH (ref 0.8–1.2)
Prothrombin Time: 18.7 s — ABNORMAL HIGH (ref 11.4–15.2)

## 2024-10-05 LAB — D-DIMER, QUANTITATIVE: D-Dimer, Quant: <0.27 ug{FEU}/mL (ref 0.00–0.50)

## 2024-10-05 MED ORDER — LACTATED RINGERS IV BOLUS: 1000.0000 mL | Freq: Once | INTRAVENOUS | Status: DC

## 2024-10-05 MED ORDER — MELATONIN 5 MG PO TABS
5.0000 mg | ORAL_TABLET | Freq: Every day | ORAL | Status: DC
  Administered 2024-10-05: 5 mg via ORAL
  Filled 2024-10-05: qty 1

## 2024-10-05 MED ORDER — VANCOMYCIN HCL 1750 MG/350ML IV SOLN
1750.0000 mg | Freq: Once | INTRAVENOUS | Status: DC
  Administered 2024-10-05: 1750 mg via INTRAVENOUS
  Filled 2024-10-05: qty 350

## 2024-10-05 MED ORDER — LACTATED RINGERS IV BOLUS
1000.0000 mL | Freq: Once | INTRAVENOUS | Status: AC
  Administered 2024-10-05: 1000 mL via INTRAVENOUS

## 2024-10-05 MED ORDER — APIXABAN 5 MG PO TABS: 5.0000 mg | ORAL_TABLET | Freq: Two times a day (BID) | ORAL | Status: DC

## 2024-10-05 MED ORDER — LEVOTHYROXINE SODIUM 25 MCG PO TABS: 125.0000 ug | ORAL_TABLET | Freq: Every day | ORAL | Status: DC

## 2024-10-05 MED ORDER — LEVOTHYROXINE SODIUM 112 MCG PO TABS
112.0000 ug | ORAL_TABLET | Freq: Every day | ORAL | Status: DC
  Administered 2024-10-06: 112 ug via ORAL
  Filled 2024-10-05 (×2): qty 1

## 2024-10-05 MED ORDER — ATORVASTATIN CALCIUM 40 MG PO TABS
40.0000 mg | ORAL_TABLET | Freq: Every day | ORAL | Status: DC
  Administered 2024-10-06: 40 mg via ORAL
  Filled 2024-10-05: qty 1

## 2024-10-05 MED ORDER — ENOXAPARIN SODIUM 40 MG/0.4ML IJ SOSY: 40.0000 mg | PREFILLED_SYRINGE | INTRAMUSCULAR | Status: DC

## 2024-10-05 MED ORDER — IOHEXOL 350 MG/ML SOLN
75.0000 mL | Freq: Once | INTRAVENOUS | Status: AC | PRN
  Administered 2024-10-05: 75 mL via INTRAVENOUS

## 2024-10-05 MED ORDER — DULOXETINE HCL 20 MG PO CPEP
20.0000 mg | ORAL_CAPSULE | Freq: Every day | ORAL | Status: DC
  Administered 2024-10-06: 20 mg via ORAL
  Filled 2024-10-05: qty 1

## 2024-10-05 MED ORDER — ACETAMINOPHEN 650 MG RE SUPP
650.0000 mg | Freq: Once | RECTAL | Status: AC
  Administered 2024-10-05: 650 mg via RECTAL
  Filled 2024-10-05: qty 1

## 2024-10-05 MED ORDER — LINEZOLID 600 MG/300ML IV SOLN
600.0000 mg | Freq: Two times a day (BID) | INTRAVENOUS | Status: DC
  Administered 2024-10-05 – 2024-10-06 (×2): 600 mg via INTRAVENOUS
  Filled 2024-10-05 (×3): qty 300

## 2024-10-05 MED ORDER — TAMSULOSIN HCL 0.4 MG PO CAPS
0.4000 mg | ORAL_CAPSULE | Freq: Every day | ORAL | Status: DC
  Administered 2024-10-06: 0.4 mg via ORAL
  Filled 2024-10-05: qty 1

## 2024-10-05 MED ORDER — POLYETHYLENE GLYCOL 3350 17 G PO PACK: 17.0000 g | PACK | Freq: Every day | ORAL | Status: DC | PRN

## 2024-10-05 MED ORDER — BREXPIPRAZOLE 2 MG PO TABS
2.0000 mg | ORAL_TABLET | Freq: Every day | ORAL | Status: DC
  Administered 2024-10-05: 2 mg via ORAL
  Filled 2024-10-05 (×2): qty 1

## 2024-10-05 MED ORDER — SODIUM CHLORIDE 0.9 % IV SOLN
3.0000 g | Freq: Three times a day (TID) | INTRAVENOUS | Status: DC
  Administered 2024-10-05 – 2024-10-06 (×3): 3 g via INTRAVENOUS
  Filled 2024-10-05 (×3): qty 8

## 2024-10-05 MED ORDER — PIPERACILLIN-TAZOBACTAM 3.375 G IVPB 30 MIN
3.3750 g | Freq: Once | INTRAVENOUS | Status: AC
  Administered 2024-10-05: 3.375 g via INTRAVENOUS
  Filled 2024-10-05: qty 50

## 2024-10-05 MED ORDER — ENOXAPARIN SODIUM 40 MG/0.4ML IJ SOSY
40.0000 mg | PREFILLED_SYRINGE | INTRAMUSCULAR | Status: DC
  Administered 2024-10-05 – 2024-10-06 (×2): 40 mg via SUBCUTANEOUS
  Filled 2024-10-05 (×2): qty 0.4

## 2024-10-05 MED ORDER — ACETAMINOPHEN 500 MG PO TABS
1000.0000 mg | ORAL_TABLET | Freq: Four times a day (QID) | ORAL | Status: DC | PRN
  Administered 2024-10-05: 1000 mg via ORAL
  Filled 2024-10-05: qty 2

## 2024-10-05 MED ORDER — MIDODRINE HCL 5 MG PO TABS
5.0000 mg | ORAL_TABLET | Freq: Three times a day (TID) | ORAL | Status: DC
  Administered 2024-10-06: 5 mg via ORAL
  Filled 2024-10-05 (×2): qty 1

## 2024-10-05 NOTE — H&P (Signed)
 Date: 10/05/2024               Patient Name:  [name] [name] MRN: [id number]  DOB: October 23, 1932 Age / Sex: 89 y.o., male   PCP: Joshua Francisco, MD         Medical Service: Internal Medicine Teaching Service         Attending Physician: Dr. Dayton Eastern      First Contact: Schuyler Novak, DO    Second Contact: Dr. Missy Sandhoff, MD         Pager Information: First Contact Pager: [phone]   Second Contact Pager: [phone]   SUBJECTIVE   Chief Complaint: Somnolence  History of Present Illness: [name] [name] is a 89 y.o. male with PMH of depression, dysphagia, hematuria, hypothyroidism, psychotic disorder with delusions, protein calorie malnutrition, dementia, essential HTN, HLD, Prostate cancer, lumbar radiculopathy, and a-fib who was transported to the ED from [facility] after staff noticed that he was more somnolent and was having persistent hiccups. History is minimal as the patient has dementia and is only oriented to himself. Upon arrival to the ER, he was found to be febrile with an increasing lactic acid. CXR, CT head, CT chest were performed without acute findings. On our interview with him, he was A&Ox1 and in no acute distress.    ED Course: Labs significant for CBC WNL, CMP with Cr 1.31, bili 2.4. Lactic 2.4-->4.0-->4.2 Imaging CT Chest, CT head, CXR without acute findings Received tylenol  650, zosyn , and vanc Consulted IMTS  Meds:  Patient reported:   No outpatient medications have been marked as taking for the 10/05/24 encounter [facility] Encounter).    Past Medical History Depression Dysphagia Hematuria Hypothyroidism Psychotic disorder with delusions Protein-calorie malnutrition Dementia HTN HLD Prostate Cancer Radiculopathy A-fib  Past Surgical History Past Surgical History:  Procedure Laterality Date   ABDOMINAL AORTOGRAM N/A 04/25/2024   Procedure: ABDOMINAL AORTOGRAM;  Surgeon: Lanis Fonda BRAVO, MD;  Location: [facility] INVASIVE CV LAB;  Service:  Cardiovascular;  Laterality: N/A;   HIP SURGERY     LOWER EXTREMITY ANGIOGRAPHY N/A 04/25/2024   Procedure: Lower Extremity Angiography;  Surgeon: Lanis Fonda BRAVO, MD;  Location: [facility] INVASIVE CV LAB;  Service: Cardiovascular;  Laterality: N/A;   LOWER EXTREMITY INTERVENTION N/A 04/25/2024   Procedure: LOWER EXTREMITY INTERVENTION;  Surgeon: Lanis Fonda BRAVO, MD;  Location: [facility] INVASIVE CV LAB;  Service: Cardiovascular;  Laterality: N/A;     Social:  Lives With: [facility] (lives in same room with wife) Occupation: retired Level of Function: Dependent PCP:  Joshua Francisco, MD  Substances: -Tobacco: unknown -Alcohol: unknown -Recreational Drug: unknown  Family History:  Family History  Problem Relation Age of Onset   Heart disease Mother    Throat cancer Mother    Hypertension Father    Prostate cancer Brother    Prostate cancer Nephew      Allergies: Allergies as of 10/05/2024 - Review Complete 10/05/2024  Allergen Reaction Noted   Sildenafil Other (See Comments) 03/15/2011    Review of Systems: A complete ROS was negative except as per HPI.   OBJECTIVE:   Physical Exam: Blood pressure 125/71, pulse 61, temperature 100.3 F (37.9 C), temperature source Oral, resp. rate 19, height 6' 4 (1.93 m), weight 87.1 kg, SpO2 100%.  Constitutional: well-appearing male, in no acute distress, thin HENT: normocephalic atraumatic, mucous membranes moist Eyes: conjunctiva non-erythematous Neck: supple Cardiovascular: irregular rhythm, no MRG Pulmonary/Chest: normal work of breathing on room air, lungs clear to auscultation bilaterally Abdominal: soft,  non-tender, non-distended MSK: cachetic appearing. Lgreat toe with wound on plantar surface.  Skin: warm and dry. Tenting present     Labs: CBC    Component Value Date/Time   WBC 5.5 10/05/2024 0931   RBC 5.28 10/05/2024 0931   HGB 13.9 10/05/2024 1154   HGB 14.8 09/21/2024 1407   HCT 41.0 10/05/2024 1154   HCT 44.6  09/21/2024 1407   PLT 143 (L) 10/05/2024 0931   PLT 186 09/21/2024 1407   MCV 91.7 10/05/2024 0931   MCV 92 09/21/2024 1407   MCH 30.3 10/05/2024 0931   MCHC 33.1 10/05/2024 0931   RDW 13.6 10/05/2024 0931   RDW 12.8 09/21/2024 1407   LYMPHSABS 1.9 09/21/2024 1407   MONOABS 0.5 08/07/2024 1544   EOSABS 0.1 09/21/2024 1407   BASOSABS 0.0 09/21/2024 1407     CMP     Component Value Date/Time   NA 136 10/05/2024 1154   NA 138 09/21/2024 1407   K 4.3 10/05/2024 1154   CL 101 10/05/2024 0945   CO2 27 10/05/2024 0931   GLUCOSE 107 (H) 10/05/2024 0945   GLUCOSE 113 (H) 10/11/2006 0936   BUN 15 10/05/2024 0945   BUN 17 09/21/2024 1407   CREATININE 1.30 (H) 10/05/2024 0945   CREATININE 1.00 06/18/2021 0812   CALCIUM  9.0 10/05/2024 0931   PROT 7.0 10/05/2024 0931   ALBUMIN 3.5 10/05/2024 0931   AST 36 10/05/2024 0931   AST 15 06/18/2021 0812   ALT 28 10/05/2024 0931   ALT 6 06/18/2021 0812   ALKPHOS 72 10/05/2024 0931   BILITOT 2.4 (H) 10/05/2024 0931   BILITOT 1.1 06/18/2021 0812   GFRNONAA 52 (L) 10/05/2024 0931   GFRNONAA >60 06/18/2021 0812   GFRAA 54 (L) 12/02/2020 0945    Imaging:  DG Foot 2 Views Left Result Date: 10/05/2024 CLINICAL DATA:  Foot infection EXAM: LEFT FOOT - 2 VIEW COMPARISON:  None Available. FINDINGS: Frontal and lateral views of the left foot are obtained on 2 images. There are no acute displaced fractures. Soft tissue defect plantar aspect of the first digit is noted, with irregularity of the underlying tuft of the first distal phalanx concerning for osteomyelitis. No periosteal reaction. No acute fractures. Hammertoe deformities are noted. IMPRESSION: 1. Soft tissue defect plantar aspect first digit, with suspected osteomyelitis involving the distal tuft of the first distal phalanx. Electronically Signed   By: Ozell Daring M.D.   On: 10/05/2024 17:02   CT CHEST ABDOMEN PELVIS W CONTRAST Result Date: 10/05/2024 EXAM: CT CHEST, ABDOMEN AND PELVIS  WITH CONTRAST 10/05/2024 02:10:56 PM TECHNIQUE: CT of the chest, abdomen and pelvis was performed with the administration of intravenous contrast. 75 mL of iohexol  (OMNIPAQUE ) 350 MG/ML injection was administered. Multiplanar reformatted images are provided for review. Automated exposure control, iterative reconstruction, and/or weight based adjustment of the mA/kV was utilized to reduce the radiation dose to as low as reasonably achievable. COMPARISON: 08/12/2023 CLINICAL HISTORY: Fever of unknown origin. FINDINGS: CHEST: MEDIASTINUM AND LYMPH NODES: Heart and pericardium are unremarkable. The central airways are clear. No mediastinal, hilar or axillary lymphadenopathy. LUNGS AND PLEURA: No focal consolidation or pulmonary edema. No pleural effusion or pneumothorax. ABDOMEN AND PELVIS: LIVER: Stable left hepatic cyst. GALLBLADDER AND BILE DUCTS: Status post cholecystectomy. No biliary ductal dilatation. SPLEEN: No acute abnormality. PANCREAS: No acute abnormality. ADRENAL GLANDS: No acute abnormality. KIDNEYS, URETERS AND BLADDER: No stones in the kidneys or ureters. No hydronephrosis. No perinephric or periureteral stranding. Urinary bladder is unremarkable.  GI AND BOWEL: Stomach demonstrates no acute abnormality. There is no bowel obstruction. REPRODUCTIVE ORGANS: No acute abnormality. PERITONEUM AND RETROPERITONEUM: No ascites. No free air. VASCULATURE: Aorta is normal in caliber. Aortic atherosclerosis. ABDOMINAL AND PELVIS LYMPH NODES: No lymphadenopathy. BONES AND SOFT TISSUES: Status post right hip arthroplasty. No acute osseous abnormality. No focal soft tissue abnormality. IMPRESSION: 1. No acute abnormality of the chest, abdomen, or pelvis. Electronically signed by: Lynwood Seip MD 10/05/2024 02:40 PM EST RP Workstation: [id number]   DG Chest 2 View Result Date: 10/05/2024 EXAM: 2 VIEW(S) XRAY OF THE CHEST 10/05/2024 09:44:00 AM COMPARISON: 08/12/2023 CLINICAL HISTORY: 89 year old male with cough.  FINDINGS: LINES, TUBES AND DEVICES: EKG leads are noted and appear in appropriate position. LUNGS AND PLEURA: Lower volume chest. No focal pulmonary opacity. No pleural effusion. No pneumothorax. HEART AND MEDIASTINUM: No acute abnormality of the cardiac and mediastinal silhouettes. BONES AND SOFT TISSUES: No acute osseous abnormality. IMPRESSION: 1. No acute cardiopulmonary abnormality. Electronically signed by: Helayne Hurst MD 10/05/2024 09:59 AM EST RP Workstation: [id number]   CT Head Wo Contrast Result Date: 10/05/2024 EXAM: CT HEAD WITHOUT CONTRAST 10/05/2024 09:52:07 AM TECHNIQUE: CT of the head was performed without the administration of intravenous contrast. Automated exposure control, iterative reconstruction, and/or weight based adjustment of the mA/kV was utilized to reduce the radiation dose to as low as reasonably achievable. COMPARISON: Head CT 08/12/2023. CLINICAL HISTORY: 89 year old male. Delirium. FINDINGS: BRAIN AND VENTRICLES: No acute hemorrhage. No evidence of acute infarct. No hydrocephalus. No extra-axial collection. No mass effect or midline shift. Brain volume is stable and normal for age. Gray white differentiation stable and within normal limits for age. No suspicious intracranial vascular hyperdensity. ORBITS: No acute abnormality. SINUSES: Mild bilateral paranasal sinus mucosal thickening is stable. Tympanic cavities and mastoids appear clear. SOFT TISSUES AND SKULL: No acute soft tissue abnormality. No skull fracture. Hyperostosis frontalis, normal variant. Calcified atherosclerosis at the skull base. IMPRESSION: 1. Stable and negative for age non-contrast CT appearance of the brain. Electronically signed by: Helayne Hurst MD 10/05/2024 09:58 AM EST RP Workstation: [id number]     EKG: personally reviewed my interpretation is a-fib.  ASSESSMENT & PLAN:   Assessment & Plan by Problem: Principal Problem:   Fever of unknown origin Active Problems:   NEOPLASM, MALIGNANT,  PROSTATE   Hypothyroidism   Atrial fibrillation (HCC)   Peripheral artery disease   Lactic acidosis   [name] [name] is a 89 y.o. male with PMH of depression, dysphagia, hematuria, hypothyroidism, psychotic disorder with delusions, protein calorie malnutrition, dementia, essential HTN, HLD, Prostate cancer, lumbar radiculopathy, and a-fib who presented with somnolence and admitted for fever of unknown origin on hospital day 0  #Fever of Unknown Origin #Osteomyelitis of L great toe -The patient presented from [facility] after the staff noticed he was more somnolent.  Upon arrival he did have a fever of unknown origin.  No leukocytosis however his lactate was elevated initially at 2.4. It did continue to trend up and is currently 4.2. - Upon exam, he did have a dressing on his left great toe. This was removed and revealed an ulcer on the plantar surface of the left great toe. Xray of L foot obtained and revealed suspected osteomyelitis--suspect this is contributing to fever and lactic acidosis -MRI pending -Continue broad spectrum coverage with xyvox and unasyn .  -Consult podiatry in the AM.  #Lactic Acidosis -Lactic trending up, currently at 4.2. Suspect this is multifactorial in setting of probable osteomyelitis  as well as profound dehydration as noted on exam -s/p 2L LR in the ED, will administer another as pt is still dry -Continue abx and treatment for infection.  -Continue to trend lactic  #Dementia #Psychotic disorder with delusions -The patient is at high risk for hospital delirium--initiate delirium precautions. -Continue home Brexpiprazole  -Continue Duloxetine  20mg    #Prostate Cancer -The patient has a known history of prostate cancer -He is endorsing suprapubic pain, however UA negative -Will obtain bladder scans to ensure pt is not retaining urine.  -Continue flomax  0.4 daily  Best practice: Diet: dysphagia VTE: Enoxaparin  IVF:  LR,Bolus Code: Full  Disposition planning: Prior to Admission Living Arrangement: Nursing Home Anticipated Discharge Location: Nursing Home  Dispo: Admit patient to Observation with expected length of stay less than 2 midnights.  Signed: Myrna Bitters, DO Internal Medicine Resident  10/05/2024, 6:30 PM  On Call pager: [phone]

## 2024-10-05 NOTE — Progress Notes (Signed)
 ED Pharmacy Antibiotic Sign Off An antibiotic consult was received from an ED provider for zosyn  and vancomycin  per pharmacy dosing for fever of unknown origin. A chart review was completed to assess appropriateness. No notable ID history.  The following one time order(s) were placed:  Zosyn  3.375  Vancomycin  1750mg  x1   Further antibiotic and/or antibiotic pharmacy consults should be ordered by the admitting provider if indicated.   Thank you for allowing pharmacy to be a part of this patient's care.   Powell Blush, PharmD, BCCCP  Clinical Pharmacist 10/05/24 3:00 PM

## 2024-10-05 NOTE — ED Notes (Signed)
 Pt was bladder scanned currently there is 197 ml in the bladder.

## 2024-10-05 NOTE — ED Notes (Signed)
PT changed at this time.

## 2024-10-05 NOTE — ED Notes (Signed)
Phlebotomy contacted for blood work.

## 2024-10-05 NOTE — ED Notes (Signed)
 PT swallows pills one at a time.

## 2024-10-05 NOTE — ED Notes (Addendum)
 [facility] notified of rectal temperature of 102.7

## 2024-10-05 NOTE — ED Triage Notes (Signed)
 PT to etc via ems from [facility] . Per facility pt is alert to self baseline. PT was unable to sleep all night due to the hiccups. PER Facility pt is less responsive and more sleepy than usual. PT responds to talking but not physical stimulation. Facility attempted to do vagal maneuvers and breathing exercise to fix hiccups. EMS put pt on oxygen because they could not get an oxygen saturation.  Hiccups started about 1 AM pt was given Reglan and around 3 AM was given gabapentin.  PER EMS vitals were 138/70, hr 55, 24 rr, apnea lasting 6 seconds. Cbg 118. Trunk temp 101.7

## 2024-10-05 NOTE — Hospital Course (Addendum)
 CKD stage IIIa, A-fib, HFpEF, HTN, HLD, PAD aspirin  Levothyroxine  125 mcg Eliquis  5 mg twice daily( likely discontinued due to history of falls) Lipitor 40 mg Lisinopril  5 mg  REXULTI   2 MG TABS Tamsulosin  0.4mg   Duloxetine  20 mg

## 2024-10-05 NOTE — ED Notes (Signed)
 Brief changed. Linen changed.

## 2024-10-05 NOTE — ED Notes (Signed)
 Contacted phlebotomy for blood work.

## 2024-10-05 NOTE — ED Provider Notes (Signed)
 [facility] AT [facility] Provider Note   CSN: [id number] Arrival date & time: 10/05/24  9198     Patient presents with: Hiccups and Fatigue   [name] [name] is a 89 y.o. male.   HPI  Patient is a 89 year old male with past medical history significant for hypothyroidism, B12 deficiency, hypertension, vascular disease, dementia  Patient is a resident of [facility] where he was found to be more somnolent this morning after hiccuping continuously overnight.  He was given gabapentin Reglan and seemed to have improvement. Was sent to ER and found to have fever.  Denies any pain. No urinary sx.   Poor historian.      Prior to Admission medications   Medication Sig Start Date End Date Taking? Authorizing Provider  acetaminophen  (TYLENOL ) 500 MG tablet Take 500 mg by mouth every 6 (six) hours as needed for mild pain (pain score 1-3) or moderate pain (pain score 4-6).    [provider]  aspirin  EC 81 MG tablet Take 1 tablet (81 mg total) by mouth daily. Swallow whole. 09/24/24   Croitoru, Mihai, MD  atorvastatin  (LIPITOR) 40 MG tablet Take 1 tablet (40 mg total) by mouth daily. 04/25/24 04/25/25  Lanis Fonda BRAVO, MD  brexpiprazole  (REXULTI ) 2 MG TABS tablet Take 2 mg by mouth at bedtime.    [provider]  DULoxetine  (CYMBALTA ) 20 MG capsule Take 20 mg by mouth daily.    [provider]  ferrous sulfate  325 (65 FE) MG tablet Take 1 tablet (325 mg total) by mouth every other day. 07/23/24   Gomes, Adriana, DO  levothyroxine  (SYNTHROID ) 125 MCG tablet Take 125 mcg by mouth daily before breakfast.    [provider]  lidocaine  4 % Place 1 patch onto the skin daily.    [provider]  lisinopril  (ZESTRIL ) 10 MG tablet Take 0.5 tablets (5 mg total) by mouth daily. 07/21/24   Gomes, Adriana, DO  melatonin 5 MG TABS Take 5 mg by mouth at bedtime.    [provider]  polyethylene glycol (MIRALAX  / GLYCOLAX ) 17 g  packet Take 17 g by mouth daily.    [provider]  tamsulosin  (FLOMAX ) 0.4 MG CAPS capsule Take 0.4 mg by mouth daily. 09/24/19   [provider]    Allergies: Sildenafil    Review of Systems  Updated Vital Signs BP 134/64   Pulse 66   Temp (!) 101.1 F (38.4 C) (Rectal) Comment: Lenorris Karger notified of temp  Resp 12   Ht 6' 4 (1.93 m)   Wt 87.1 kg   SpO2 100%   BMI 23.37 kg/m   Physical Exam Vitals and nursing note reviewed.  Constitutional:      General: He is not in acute distress. HENT:     Head: Normocephalic and atraumatic.     Nose: Nose normal.     Mouth/Throat:     Mouth: Mucous membranes are dry.  Eyes:     General: No scleral icterus. Cardiovascular:     Rate and Rhythm: Normal rate and regular rhythm.     Pulses: Normal pulses.     Heart sounds: Normal heart sounds.  Pulmonary:     Effort: Pulmonary effort is normal. No respiratory distress.     Breath sounds: No wheezing.     Comments: Lungs clear Abdominal:     Palpations: Abdomen is soft.     Tenderness: There is no abdominal tenderness. There is no guarding or rebound.  Musculoskeletal:     Cervical back: Normal range of motion.     Right lower leg: No edema.     Left lower leg: No edema.  Skin:    General: Skin is warm and dry.     Capillary Refill: Capillary refill takes less than 2 seconds.     Comments: Right great toe small proximal 5 mm in diameter scant purulence  Neurological:     Mental Status: He is alert. Mental status is at baseline.     Comments: Oriented x1 Baseline   Moves all four extremities  Sensation nml all four extremities   Psychiatric:        Mood and Affect: Mood normal.        Behavior: Behavior normal.     (all labs ordered are listed, but only abnormal results are displayed) Labs Reviewed  CBC - Abnormal; Notable for the following components:      Result Value   Platelets 143 (*)    All other components within normal limits  COMPREHENSIVE  METABOLIC PANEL WITH GFR - Abnormal; Notable for the following components:   Glucose, Bld 103 (*)    Creatinine, Ser 1.31 (*)    Total Bilirubin 2.4 (*)    GFR, Estimated 52 (*)    All other components within normal limits  URINALYSIS, ROUTINE W REFLEX MICROSCOPIC - Abnormal; Notable for the following components:   Hgb urine dipstick TRACE (*)    All other components within normal limits  URINALYSIS, MICROSCOPIC (REFLEX) - Abnormal; Notable for the following components:   Bacteria, UA RARE (*)    All other components within normal limits  I-STAT CHEM 8, ED - Abnormal; Notable for the following components:   Creatinine, Ser 1.30 (*)    Glucose, Bld 107 (*)    All other components within normal limits  I-STAT CG4 LACTIC ACID, ED - Abnormal; Notable for the following components:   Lactic Acid, Venous 2.4 (*)    All other components within normal limits  I-STAT VENOUS BLOOD GAS, ED - Abnormal; Notable for the following components:   pCO2, Ven 38.6 (*)    pO2, Ven 49 (*)    Calcium , Ion 1.10 (*)    All other components within normal limits  I-STAT CG4 LACTIC ACID, ED - Abnormal; Notable for the following components:   Lactic Acid, Venous 2.5 (*)    All other components within normal limits  I-STAT CG4 LACTIC ACID, ED - Abnormal; Notable for the following components:   Lactic Acid, Venous 4.0 (*)    All other components within normal limits  RESP PANEL BY RT-PCR (RSV, FLU A&B, COVID)  RVPGX2  CULTURE, BLOOD (SINGLE)  CULTURE, BLOOD (ROUTINE X 2)  CULTURE, BLOOD (ROUTINE X 2)  D-DIMER, QUANTITATIVE  SEDIMENTATION RATE  C-REACTIVE PROTEIN  TSH  I-STAT CG4 LACTIC ACID, ED  I-STAT CG4 LACTIC ACID, ED    EKG: None  Radiology: CT CHEST ABDOMEN PELVIS W CONTRAST Result Date: 10/05/2024 EXAM: CT CHEST, ABDOMEN AND PELVIS WITH CONTRAST 10/05/2024 02:10:56 PM TECHNIQUE: CT of the chest, abdomen and pelvis was performed with the administration of intravenous contrast. 75 mL of iohexol   (OMNIPAQUE ) 350 MG/ML injection was administered. Multiplanar reformatted images are provided for review. Automated exposure control, iterative reconstruction, and/or weight based adjustment of the mA/kV was utilized to reduce the radiation dose to as low as reasonably achievable. COMPARISON: 08/12/2023 CLINICAL HISTORY: Fever of unknown origin. FINDINGS: CHEST: MEDIASTINUM AND LYMPH NODES: Heart and pericardium are unremarkable.  The central airways are clear. No mediastinal, hilar or axillary lymphadenopathy. LUNGS AND PLEURA: No focal consolidation or pulmonary edema. No pleural effusion or pneumothorax. ABDOMEN AND PELVIS: LIVER: Stable left hepatic cyst. GALLBLADDER AND BILE DUCTS: Status post cholecystectomy. No biliary ductal dilatation. SPLEEN: No acute abnormality. PANCREAS: No acute abnormality. ADRENAL GLANDS: No acute abnormality. KIDNEYS, URETERS AND BLADDER: No stones in the kidneys or ureters. No hydronephrosis. No perinephric or periureteral stranding. Urinary bladder is unremarkable. GI AND BOWEL: Stomach demonstrates no acute abnormality. There is no bowel obstruction. REPRODUCTIVE ORGANS: No acute abnormality. PERITONEUM AND RETROPERITONEUM: No ascites. No free air. VASCULATURE: Aorta is normal in caliber. Aortic atherosclerosis. ABDOMINAL AND PELVIS LYMPH NODES: No lymphadenopathy. BONES AND SOFT TISSUES: Status post right hip arthroplasty. No acute osseous abnormality. No focal soft tissue abnormality. IMPRESSION: 1. No acute abnormality of the chest, abdomen, or pelvis. Electronically signed by: Lynwood Seip MD 10/05/2024 02:40 PM EST RP Workstation: [id number]   DG Chest 2 View Result Date: 10/05/2024 EXAM: 2 VIEW(S) XRAY OF THE CHEST 10/05/2024 09:44:00 AM COMPARISON: 08/12/2023 CLINICAL HISTORY: 89 year old male with cough. FINDINGS: LINES, TUBES AND DEVICES: EKG leads are noted and appear in appropriate position. LUNGS AND PLEURA: Lower volume chest. No focal pulmonary opacity. No pleural  effusion. No pneumothorax. HEART AND MEDIASTINUM: No acute abnormality of the cardiac and mediastinal silhouettes. BONES AND SOFT TISSUES: No acute osseous abnormality. IMPRESSION: 1. No acute cardiopulmonary abnormality. Electronically signed by: Helayne Hurst MD 10/05/2024 09:59 AM EST RP Workstation: [id number]   CT Head Wo Contrast Result Date: 10/05/2024 EXAM: CT HEAD WITHOUT CONTRAST 10/05/2024 09:52:07 AM TECHNIQUE: CT of the head was performed without the administration of intravenous contrast. Automated exposure control, iterative reconstruction, and/or weight based adjustment of the mA/kV was utilized to reduce the radiation dose to as low as reasonably achievable. COMPARISON: Head CT 08/12/2023. CLINICAL HISTORY: 89 year old male. Delirium. FINDINGS: BRAIN AND VENTRICLES: No acute hemorrhage. No evidence of acute infarct. No hydrocephalus. No extra-axial collection. No mass effect or midline shift. Brain volume is stable and normal for age. Gray white differentiation stable and within normal limits for age. No suspicious intracranial vascular hyperdensity. ORBITS: No acute abnormality. SINUSES: Mild bilateral paranasal sinus mucosal thickening is stable. Tympanic cavities and mastoids appear clear. SOFT TISSUES AND SKULL: No acute soft tissue abnormality. No skull fracture. Hyperostosis frontalis, normal variant. Calcified atherosclerosis at the skull base. IMPRESSION: 1. Stable and negative for age non-contrast CT appearance of the brain. Electronically signed by: Helayne Hurst MD 10/05/2024 09:58 AM EST RP Workstation: [id number]     .Critical Care  Performed by: Neldon Hamp RAMAN, PA Authorized by: Neldon Hamp RAMAN, PA   Critical care provider statement:    Critical care time (minutes):  35   Critical care time was exclusive of:  Separately billable procedures and treating other patients and teaching time   Critical care was necessary to treat or prevent imminent or life-threatening  deterioration of the following conditions: AMS/fever/poss accult bacteremia.   Critical care was time spent personally by me on the following activities:  Development of treatment plan with patient or surrogate, review of old charts, re-evaluation of patient's condition, pulse oximetry, ordering and review of radiographic studies, ordering and review of laboratory studies, ordering and performing treatments and interventions, obtaining history from patient or surrogate, examination of patient and evaluation of patient's response to treatment   Care discussed with: admitting provider      Medications Ordered in the ED  vancomycin  (VANCOREADY) IVPB  1750 mg/350 mL (has no administration in time range)  piperacillin -tazobactam (ZOSYN ) IVPB 3.375 g (has no administration in time range)  acetaminophen  (TYLENOL ) tablet 1,000 mg (1,000 mg Oral Given 10/05/24 1611)  polyethylene glycol (MIRALAX  / GLYCOLAX ) packet 17 g (has no administration in time range)  atorvastatin  (LIPITOR) tablet 40 mg (has no administration in time range)  levothyroxine  (SYNTHROID ) tablet 125 mcg (has no administration in time range)  melatonin tablet 5 mg (has no administration in time range)  DULoxetine  (CYMBALTA ) DR capsule 20 mg (has no administration in time range)  acetaminophen  (TYLENOL ) suppository 650 mg (650 mg Rectal Given 10/05/24 1053)  lactated ringers  bolus 1,000 mL (0 mLs Intravenous Stopped 10/05/24 1513)  lactated ringers  bolus 1,000 mL (0 mLs Intravenous Stopped 10/05/24 1607)  iohexol  (OMNIPAQUE ) 350 MG/ML injection 75 mL (75 mLs Intravenous Contrast Given 10/05/24 1415)    Clinical Course as of 10/05/24 1641  Fri Oct 05, 2024  1543 Patient mated to internal medicine teaching service for fever of unknown origin. [WF]    Clinical Course User Index [WF] Neldon Hamp RAMAN, [place]                                 Medical Decision Making Amount and/or Complexity of Data Reviewed Labs: ordered. Radiology:  ordered.  Risk OTC drugs. Prescription drug management. Decision regarding hospitalization.   This patient presents to the ED for concern of AMS/somnolence, this involves a number of treatment options, and is a complaint that carries with it a high risk of complications and morbidity. A differential diagnosis was considered for the patient's symptoms which is discussed below:   The differential diagnosis of weakness includes but is not limited to neurologic causes (GBS, myasthenia gravis, CVA, MS, ALS, transverse myelitis, spinal cord injury, CVA, botulism, ) and other causes: ACS, Arrhythmia, syncope, orthostatic hypotension, sepsis, hypoglycemia, electrolyte disturbance, hypothyroidism, respiratory failure, symptomatic anemia, dehydration, heat injury, polypharmacy, malignancy.    Co morbidities: Discussed in HPI   Brief History:  Patient is a 89 year old male with past medical history significant for hypothyroidism, B12 deficiency, hypertension, vascular disease, dementia  Patient is a resident of [facility] where he was found to be more somnolent this morning after hiccuping continuously overnight.  He was given gabapentin Reglan and seemed to have improvement. Was sent to ER and found to have fever.  Denies any pain. No urinary sx.   Poor historian.     EMR reviewed including pt PMHx, past surgical history and past visits to ER.   See HPI for more details   Lab Tests:   I ordered and independently interpreted labs. Labs notable for Patient without leukocytosis, concerning only has uptrending of lactate not on metformin no albuterol  administration and I did not see any other medications that would cause this.  Initial CMP without anion gap.  Bicarb is normal at 27.  Imaging Studies:  NAD. I personally reviewed all imaging studies and no acute abnormality found. I agree with radiology interpretation.    Cardiac Monitoring:  The patient was maintained on a cardiac  monitor.  I personally viewed and interpreted the cardiac monitored which showed an underlying rhythm of: Atrial fibrillation EKG non-ischemic   Medicines ordered:  I ordered medication including lactated Ringer 's Tylenol , vancomycin  and Zosyn  for abnormal labs and fever of unknown origin.  Reevaluation of the patient after these medicines showed that the patient stayed the same I have reviewed the  patients home medicines and have made adjustments as needed   Critical Interventions:     Consults/Attending Physician   I discussed this case with my attending physician who cosigned this note including patient's presenting symptoms, physical exam, and planned diagnostics and interventions. Attending physician stated agreement with plan or made changes to plan which were implemented.   Reevaluation:  After the interventions noted above I re-evaluated patient and found that they have :stayed the same   Social Determinants of Health:      Problem List / ED Course:  Patient is a 89 year old male patient with fever of unknown origin has dementia unable to provide much history.  I doubt that this patient is suffering from encephalitis or meningitis he has had no seizures he seems to be at his mental status baseline he has no nuchal rigidity his exam is relatively benign neurologic standpoint no abnormal findings.  CT head chest abdomen pelvis all unremarkable urinalysis without evidence of infection blood cultures obtained and pending broad-spectrum antibiotics given will admit to for internal medicine teaching service follow-up on culture   Dispostion:  After consideration of the diagnostic results and the patients response to treatment, I feel that the patent would benefit from admission.    Final diagnoses:  Fever of unknown origin    ED Discharge Orders     None          Neldon Hamp RAMAN, [place] 10/05/24 1645    Pamella Ozell LABOR, [place] 10/10/24 [phone]

## 2024-10-05 NOTE — ED Notes (Signed)
 Al and Andree nephew and niece phone number [phone]. Call with any updates so they can update his wife.

## 2024-10-06 ENCOUNTER — Observation Stay (HOSPITAL_COMMUNITY)

## 2024-10-06 ENCOUNTER — Encounter (HOSPITAL_COMMUNITY): Payer: Self-pay | Admitting: Internal Medicine

## 2024-10-06 DIAGNOSIS — L089 Local infection of the skin and subcutaneous tissue, unspecified: Secondary | ICD-10-CM

## 2024-10-06 DIAGNOSIS — I4891 Unspecified atrial fibrillation: Secondary | ICD-10-CM

## 2024-10-06 DIAGNOSIS — E039 Hypothyroidism, unspecified: Secondary | ICD-10-CM

## 2024-10-06 DIAGNOSIS — M86172 Other acute osteomyelitis, left ankle and foot: Secondary | ICD-10-CM

## 2024-10-06 DIAGNOSIS — C61 Malignant neoplasm of prostate: Secondary | ICD-10-CM | POA: Diagnosis not present

## 2024-10-06 DIAGNOSIS — M869 Osteomyelitis, unspecified: Secondary | ICD-10-CM | POA: Insufficient documentation

## 2024-10-06 DIAGNOSIS — E872 Acidosis, unspecified: Secondary | ICD-10-CM

## 2024-10-06 DIAGNOSIS — R509 Fever, unspecified: Secondary | ICD-10-CM

## 2024-10-06 DIAGNOSIS — I739 Peripheral vascular disease, unspecified: Secondary | ICD-10-CM

## 2024-10-06 LAB — COMPREHENSIVE METABOLIC PANEL WITH GFR
ALT: 29 U/L (ref 0–44)
AST: 36 U/L (ref 15–41)
Albumin: 2.8 g/dL — ABNORMAL LOW (ref 3.5–5.0)
Alkaline Phosphatase: 56 U/L (ref 38–126)
Anion gap: 13 (ref 5–15)
BUN: 13 mg/dL (ref 8–23)
CO2: 22 mmol/L (ref 22–32)
Calcium: 8.5 mg/dL — ABNORMAL LOW (ref 8.9–10.3)
Chloride: 100 mmol/L (ref 98–111)
Creatinine, Ser: 1.2 mg/dL (ref 0.61–1.24)
GFR, Estimated: 57 mL/min — ABNORMAL LOW (ref >60)
Glucose, Bld: 123 mg/dL — ABNORMAL HIGH (ref 70–99)
Potassium: 4 mmol/L (ref 3.5–5.1)
Sodium: 135 mmol/L (ref 135–145)
Total Bilirubin: 2.7 mg/dL — ABNORMAL HIGH (ref 0.0–1.2)
Total Protein: 5.9 g/dL — ABNORMAL LOW (ref 6.5–8.1)

## 2024-10-06 LAB — VAS US ABI WITH/WO TBI
Left ABI: 0.99
Right ABI: 1.07

## 2024-10-06 LAB — CBC
HCT: 43.7 % (ref 39.0–52.0)
Hemoglobin: 14.7 g/dL (ref 13.0–17.0)
MCH: 30.4 pg (ref 26.0–34.0)
MCHC: 33.6 g/dL (ref 30.0–36.0)
MCV: 90.3 fL (ref 80.0–100.0)
Platelets: 111 K/uL — ABNORMAL LOW (ref 150–400)
RBC: 4.84 MIL/uL (ref 4.22–5.81)
RDW: 13.6 % (ref 11.5–15.5)
WBC: 8.6 K/uL (ref 4.0–10.5)
nRBC: 0 % (ref 0.0–0.2)

## 2024-10-06 LAB — LACTIC ACID, PLASMA: Lactic Acid, Venous: 3.9 mmol/L (ref 0.5–1.9)

## 2024-10-06 MED ORDER — DOXYCYCLINE HYCLATE 100 MG PO TABS: 100.0000 mg | ORAL_TABLET | Freq: Two times a day (BID) | ORAL | Status: AC

## 2024-10-06 MED ORDER — CHLORPROMAZINE HCL 25 MG PO TABS: 25.0000 mg | ORAL_TABLET | Freq: Three times a day (TID) | ORAL | Status: AC | PRN

## 2024-10-06 MED ORDER — LACTATED RINGERS IV SOLN: INTRAVENOUS | Status: DC

## 2024-10-06 MED ORDER — LEVOTHYROXINE SODIUM 112 MCG PO TABS: 112.0000 ug | ORAL_TABLET | Freq: Every day | ORAL | 0 refills | Status: AC

## 2024-10-06 MED ORDER — AMOXICILLIN-POT CLAVULANATE 875-125 MG PO TABS: 1.0000 | ORAL_TABLET | Freq: Two times a day (BID) | ORAL | Status: AC

## 2024-10-06 MED ORDER — CHLORPROMAZINE HCL 25 MG PO TABS: 25.0000 mg | ORAL_TABLET | Freq: Three times a day (TID) | ORAL | Status: DC | PRN

## 2024-10-06 NOTE — Care Management Obs Status (Signed)
 MEDICARE OBSERVATION STATUS NOTIFICATION   Patient Details  Name: [name] [name] MRN: [id number] Date of Birth: 1932/10/05   Medicare Observation Status Notification Given:  No (Attempt to reach wife, phone number in chart wrong.)    Robynn Eileen Hoose, RN 10/06/2024, 9:11 AM

## 2024-10-06 NOTE — TOC Transition Note (Signed)
 Transition of Care [facility]) - Discharge Note   Patient Details  Name: [name] [name] MRN: [id number] Date of Birth: 1932/10/15  [facility] CM/SW Contact:  Ermalinda Penton [place], [place] Phone Number: 10/06/2024, 1:05 PM   Clinical Narrative:     Patient to discharge to [facility]. Patient will be going in to room 508 and will be transported by [facility]. RN to call report to Maxine at [phone].   Keshawn Fiorito, LCSW Transition of Care          Patient Goals and CMS Choice            Discharge Placement                       Discharge Plan and Services Additional resources added to the After Visit Summary for                                       Social Drivers of Health (SDOH) Interventions SDOH Screenings   Food Insecurity: No Food Insecurity (07/21/2024)  Housing: Low Risk  (07/21/2024)  Transportation Needs: No Transportation Needs (07/21/2024)  Utilities: Not At Risk (07/21/2024)  Social Connections: Moderately Integrated (07/21/2024)  Tobacco Use: Medium Risk (10/06/2024)     Readmission Risk Interventions     No data to display

## 2024-10-06 NOTE — Evaluation (Addendum)
 Occupational Therapy Evaluation Patient Details Name: [name] [name] MRN: [id number] DOB: 01-25-1932 Today's Date: 10/06/2024   History of Present Illness   89 y.o. male transported to the ED from [facility] due to being somnolent and was having persistent hiccups. Osteomyelitis of L great toe and Lactic Acidosis. PMH of depression, dysphagia, hematuria, hypothyroidism, psychotic disorder with delusions, protein calorie malnutrition, dementia, essential HTN, HLD, Prostate cancer, lumbar radiculopathy, and a-fib.     Clinical Impressions Attempted to call family for PLOF at [facility] - no answer. Apparently pt is a resident of [facility] and ambulates with a RW. Assuming pt requires assistance for ADL tasks due to dementia. Per nsg, pt's wife also resides at [facility]. Able to progress to EOB with mod A and stand with min A @ RW level however unable to process at this time to take steps to chair. Pt reports states he is dizzy when asked. BP after return to sitting 130/88. Plan is to DC back to SNF. Acute OT will follow to facilitate DC back to SNF.      If plan is discharge home, recommend the following:    (NA)     Functional Status Assessment   Patient has had a recent decline in their functional status and demonstrates the ability to make significant improvements in function in a reasonable and predictable amount of time.     Equipment Recommendations   None recommended by OT     Recommendations for Other Services         Precautions/Restrictions   Precautions Precautions: Fall Recall of Precautions/Restrictions: Impaired Precaution/Restrictions Comments: wound L great toe Restrictions Weight Bearing Restrictions Per Provider Order:  (none noted)     Mobility Bed Mobility Overal bed mobility: Needs Assistance Bed Mobility: Supine to Sit, Sit to Supine     Supine to sit: Mod assist Sit to supine: Mod assist   General bed mobility comments: A for  legs    Transfers Overall transfer level: Needs assistance   Transfers: Sit to/from Stand Sit to Stand: Min assist, From elevated surface                  Balance Overall balance assessment: History of Falls, Needs assistance Sitting-balance support: Feet supported Sitting balance-Leahy Scale: Fair       Standing balance-Leahy Scale: Poor                             ADL either performed or assessed with clinical judgement   ADL Overall ADL's : Needs assistance/impaired Eating/Feeding: Set up;Supervision/ safety;Sitting   Grooming: Set up;Supervision/safety;Sitting   Upper Body Bathing: Moderate assistance;Sitting   Lower Body Bathing: Maximal assistance;Sit to/from stand   Upper Body Dressing : Moderate assistance;Sitting   Lower Body Dressing: Maximal assistance;Sit to/from stand               Functional mobility during ADLs: Moderate assistance;Rolling walker (2 wheels) General ADL Comments: able to stand and march bedside. unable to process information to take steps to chair     Vision   Additional Comments: will further assess     Perception         Praxis         Pertinent Vitals/Pain Pain Assessment Pain Assessment: Faces Pain Score: 0-No pain     Extremity/Trunk Assessment Upper Extremity Assessment Upper Extremity Assessment: Generalized weakness   Lower Extremity Assessment Lower Extremity Assessment: Defer to PT evaluation   Cervical /  Trunk Assessment Cervical / Trunk Assessment: Kyphotic (hx of back pain)   Communication Communication Communication: No apparent difficulties   Cognition Arousal: Lethargic Behavior During Therapy: Flat affect Cognition: No family/caregiver present to determine baseline, History of cognitive impairments, Cognition impaired   Orientation impairments: Place, Time, Situation Awareness: Intellectual awareness impaired, Online awareness impaired Memory impairment (select all  impairments): Short-term memory, Working civil service fast streamer, Engineer, structural memory Attention impairment (select first level of impairment): Sustained attention Executive functioning impairment (select all impairments): Initiation, Organization, Sequencing, Reasoning, Problem solving                   Following commands: Impaired Following commands impaired: Follows one step commands inconsistently     Cueing  General Comments   Cueing Techniques: Verbal cues;Gestural cues;Visual cues;Tactile cues      Exercises  Seated marching; standing marching   Shoulder Instructions      Home Living Family/patient expects to be discharged to:: Skilled nursing facility                                        Prior Functioning/Environment Prior Level of Function : Needs assist               ADLs Comments: pt reports he walks using a RW; assume assist for ADL task; pt unable to give information    OT Problem List: Decreased strength;Decreased range of motion;Decreased activity tolerance;Impaired balance (sitting and/or standing);Decreased knowledge of use of DME or AE;Decreased cognition;Decreased safety awareness;Cardiopulmonary status limiting activity   OT Treatment/Interventions: Self-care/ADL training;Therapeutic exercise;DME and/or AE instruction;Therapeutic activities;Cognitive remediation/compensation;Patient/family education;Balance training      OT Goals(Current goals can be found in the care plan section)   Acute Rehab OT Goals Patient Stated Goal: none stated OT Goal Formulation: Patient unable to participate in goal setting Time For Goal Achievement: 10/20/24 Potential to Achieve Goals: Fair   OT Frequency:  Min 2X/week    Co-evaluation              AM-PAC OT 6 Clicks Daily Activity     Outcome Measure Help from another person eating meals?: A Little Help from another person taking care of personal grooming?: A Little Help from another  person toileting, which includes using toliet, bedpan, or urinal?: A Lot Help from another person bathing (including washing, rinsing, drying)?: A Lot Help from another person to put on and taking off regular upper body clothing?: A Lot Help from another person to put on and taking off regular lower body clothing?: A Lot 6 Click Score: 14   End of Session Equipment Utilized During Treatment: Gait belt;Rolling walker (2 wheels) Nurse Communication: Mobility status  Activity Tolerance: Patient tolerated treatment well Patient left: in bed;with call bell/phone within reach;with nursing/sitter in room;with bed alarm set (left with NT;asked her to place pt in chair position once she was finished)  OT Visit Diagnosis: Unsteadiness on feet (R26.81);Other abnormalities of gait and mobility (R26.89);Muscle weakness (generalized) (M62.81);Other symptoms and signs involving cognitive function                Time: 9157-9084 OT Time Calculation (min): 33 min Charges:  OT General Charges $OT Visit: 1 Visit OT Evaluation $OT Eval Moderate Complexity: 1 Mod OT Treatments $Self Care/Home Management : 8-22 mins  Kreg Sink, OT/L   Acute OT Clinical Specialist Acute Rehabilitation Services Pager [phone] Office [phone]   [facility]  10/06/2024, 9:27 AM

## 2024-10-06 NOTE — Progress Notes (Signed)
 Report to [facility] attempted, no answer. Attempted to call family to make aware of discharge, no answer.

## 2024-10-06 NOTE — Discharge Instructions (Addendum)
 Thank you for allowing us  to be part of your care. You were hospitalized for a fever.   See the changes in your medications and management of your chronic conditions below:  *For your toe wound/infection -We have STARTED you on these following medications:  -Augmentin --take 1 tablet every 12 hours for 7 days  -Doxycycline --Take 1 tablet every 12 hours for 7 days  *For your Hiccups  -We have STARTED you on these following medications:  -Thorazine --take 1 tablet up to 3 times a day as needed for hiccups for 5 days  *For your A-fib  -We have STOPPED you on these following medications:  -Eliquis --we have stopped this medication due to bleeding risks in your age. It is no longer recommended.   -IF you were to restart it, you should be on 2.5mg  BID due to your age (>80yo)   -Please have the facility help schedule a podiatry appointment -Please see your PCP in 7 to 10 days  FOLLOW UP APPOINTMENTS:  Please make sure to finish your antibiotics  Please call your PCP or our clinic if you have any questions or concerns, we may be able to help and keep you from a long and expensive emergency room wait. Our clinic and after hours phone number is [phone]. The best time to call is Monday through Friday 9 am to 4 pm but there is always someone available 24/7 if you have an emergency. If you need medication refills please notify your pharmacy one week in advance and they will send us  a request.   We are glad you are feeling better,  Schuyler Novak, DO Internal Medicine Inpatient Teaching Service at [facility]

## 2024-10-06 NOTE — Progress Notes (Signed)
 VASCULAR LAB    ABI has been performed.  See CV proc for preliminary results.   Galina Haddox, RVT 10/06/2024, 11:22 AM

## 2024-10-06 NOTE — Discharge Summary (Signed)
 Name: [name] [name] MRN: [id number] DOB: [birth year] 89 y.o. PCP: Joshua Francisco, MD  Date of Admission: 10/05/2024  8:01 AM Date of Discharge: 10/06/2024 Attending Physician: Dr. Dayton Eastern  Discharge Diagnosis: 1. Principal Problem:   Lactic acidosis Active Problems:   NEOPLASM, MALIGNANT, PROSTATE   Hypothyroidism   Atrial fibrillation (HCC)   Peripheral artery disease   Fever of unknown origin   Foot osteomyelitis [facility])   Discharge Medications: Allergies as of 10/06/2024       Reactions   Sildenafil Other (See Comments)   Other reaction(s): Blurring of visual image        Medication List     TAKE these medications    acetaminophen  500 MG tablet Commonly known as: TYLENOL  Take 500 mg by mouth every 6 (six) hours as needed for mild pain (pain score 1-3) or moderate pain (pain score 4-6).   amoxicillin -clavulanate 875-125 MG tablet Commonly known as: AUGMENTIN  Take 1 tablet by mouth 2 (two) times daily for 7 days.   aspirin  EC 81 MG tablet Take 1 tablet (81 mg total) by mouth daily. Swallow whole.   atorvastatin  40 MG tablet Commonly known as: Lipitor Take 1 tablet (40 mg total) by mouth daily.   brexpiprazole  2 MG Tabs tablet Commonly known as: REXULTI  Take 2 mg by mouth at bedtime.   chlorproMAZINE  25 MG tablet Commonly known as: THORAZINE  Take 1 tablet (25 mg total) by mouth 3 (three) times daily as needed for up to 5 days for hiccoughs.   doxycycline  100 MG tablet Commonly known as: VIBRA -TABS Take 1 tablet (100 mg total) by mouth 2 (two) times daily for 7 days.   DULoxetine  20 MG capsule Commonly known as: CYMBALTA  Take 20 mg by mouth daily.   ferrous sulfate  325 (65 FE) MG tablet Take 1 tablet (325 mg total) by mouth every other day.   levothyroxine  112 MCG tablet Commonly known as: SYNTHROID  Take 1 tablet (112 mcg total) by mouth daily before breakfast. Start taking on: October 07, 2024 What changed:  medication strength how much to  take   lidocaine  4 % Place 1 patch onto the skin daily.   lisinopril  10 MG tablet Commonly known as: ZESTRIL  Take 0.5 tablets (5 mg total) by mouth daily.   melatonin 5 MG Tabs Take 5 mg by mouth at bedtime.   polyethylene glycol 17 g packet Commonly known as: MIRALAX  / GLYCOLAX  Take 17 g by mouth daily.   tamsulosin  0.4 MG Caps capsule Commonly known as: FLOMAX  Take 0.4 mg by mouth daily.        Disposition and follow-up:   Sr.Rayder S Fite was discharged from [facility] in Stable condition.  At the hospital follow up visit please address:  Chronic Left great toe wound--xray showed osteomyelitis in distal phalanx, the patient needs outpatient podiatry follow up--please arrange.   -Pt to complete 7 day course of augmentin  and doxycycline  A-fib--Eliquis  was discontinued on admission as the patient is a fall risk, and at 89 years old, there are significantly increased risks with him being on anticoagulation. If he were to resume it, he would need reduced dose at 2.5mg  BID due to his age.   2.  Labs / imaging needed at time of follow-up: BMP--follow up creatinine (mildly elevated on admission)  3.  Pending labs/ test needing follow-up: Blood cultures   Hospital Course by problem list: [name] [name] is a 89 y.o. male with PMH of depression, dysphagia, hematuria, hypothyroidism, psychotic  disorder with delusions, protein calorie malnutrition, dementia, essential HTN, HLD, Prostate cancer, lumbar radiculopathy, and a-fib who presented with somnolence and admitted for fever of unknown origin now being discharged on hospital day 1  Fever of unknown origin Osteomyelitis of left great toe The patient originally presented from [facility] where he was sent to the ER due to increased somnolence.  Upon arrival to the ER he was found to have a fever with no obvious source. Workup was initiated with blood cultures, CT abdomen, chest x-ray, and CT head all which were  negative.  Blood cultures no growth to date at time of discharge (24 hours).  A wound was noted on the left great toe and an x-ray was obtained which did show suspected osteomyelitis in the left distal tuft of first distal phalanx.  This wound was very chronic appearing in nature. He was initiated on broad-spectrum antibiotics including Zyvox  and Unasyn . His lactic acid was elevated up to 4.2 however very low suspicion for sepsis as he did remain without a white count and his vital signs were stable.  He was found to be profoundly dehydrated and was given multiple liters of fluid here with improvement in his lactic acid. ABIs were obtained which were normal.  On 12/5 he was evaluated by the medical team and was alert and oriented to himself only which is baseline.  He did remain afebrile without Tylenol  overnight and throughout the day.  At this time he was deemed medically stable for discharge with outpatient podiatry follow-up.  He was discharged on Augmentin  and doxycycline  for 1 week.  Hiccups The patient was having persistent hiccups preceding and during admission. He was given thorazine  and discharge with a short course.   Stable chronic medical conditions: Dementia A-fib  Subjective The patient was seen and examined at the bedside this morning.  He was resting comfortably.  He was alert and oriented to himself which is his baseline.  He appeared in no acute distress.  Discharge Exam:   BP 129/66 (BP Location: Left Arm)   Pulse (!) 58   Temp 98.2 F (36.8 C) (Axillary)   Resp 15   Ht 6' 4 (1.93 m)   Wt 84.8 kg   SpO2 100%   BMI 22.76 kg/m  Discharge exam:  Const: Awake, alert in NAD HENT: Normocephalic, atraumatic, mucus membranes moist Eyes: PERRL Card: Irregularly irregular rhythm, No MRG, No pitting edema on LE's bilaterally  Resp: LCTAB, no increased work of breathing Abd: Soft, NTND, Bsx4 Extremities: Warm, dry.  Chronic ulcer noted in plantar surface of left great toe.   Without purulence or drainage.    Pertinent Labs, Studies, and Procedures:     Latest Ref Rng & Units 10/06/2024    6:56 AM 10/05/2024   11:01 PM 10/05/2024   11:54 AM  CBC  WBC 4.0 - 10.5 K/uL 8.6  6.8    Hemoglobin 13.0 - 17.0 g/dL 85.2  84.6  86.0   Hematocrit 39.0 - 52.0 % 43.7  46.0  41.0   Platelets 150 - 400 K/uL 111  126         Latest Ref Rng & Units 10/06/2024   11:11 AM 10/05/2024   11:01 PM 10/05/2024   11:54 AM  CMP  Glucose 70 - 99 mg/dL 876  841    BUN 8 - 23 mg/dL 13  11    Creatinine 9.38 - 1.24 mg/dL 8.79  8.77    Sodium 864 - 145 mmol/L 135  135  136   Potassium 3.5 - 5.1 mmol/L 4.0  4.3  4.3   Chloride 98 - 111 mmol/L 100  101    CO2 22 - 32 mmol/L 22  21    Calcium  8.9 - 10.3 mg/dL 8.5  8.7    Total Protein 6.5 - 8.1 g/dL 5.9     Total Bilirubin 0.0 - 1.2 mg/dL 2.7     Alkaline Phos 38 - 126 U/L 56     AST 15 - 41 U/L 36     ALT 0 - 44 U/L 29       VAS US  ABI WITH/WO TBI Result Date: 10/06/2024  LOWER EXTREMITY DOPPLER STUDY Patient Name:  [name] [name] [facility]  Date of Exam:   10/06/2024 Medical Rec #: [id number]          Accession #:    [id number] Date of Birth: 04-Nov-1931         Patient Gender: M Patient Age:   24 years Exam Location:  [facility] Procedure:      VAS US  ABI WITH/WO TBI Referring Phys: JULIE WILLIAMS --------------------------------------------------------------------------------  Indications: Ulceration of left great toe. Osteomyelitis  Vascular Interventions: 04/25/2024: Drug-coated balloon angioplasty left                         tibioperoneal trunk 4 x 80 mm balloon. Balloon                         angioplasty left posterior tibial artery proximal 3 x                         60, distal 2.5 x 220. Balloon angioplasty left peroneal                         artery 3 x 60. Limitations: Today's exam was limited due to Hiccups, atrial fibrillation,              bradycardia. Comparison Study: Prior ABI done 04/19/24 Performing Technologist:  Rachel Pellet RVS  Examination Guidelines: A complete evaluation includes at minimum, Doppler waveform signals and systolic blood pressure reading at the level of bilateral brachial, anterior tibial, and posterior tibial arteries, when vessel segments are accessible. Bilateral testing is considered an integral part of a complete examination. Photoelectric Plethysmograph (PPG) waveforms and toe systolic pressure readings are included as required and additional duplex testing as needed. Limited examinations for reoccurring indications may be performed as noted.  ABI Findings: +---------+------------------+-----+---------+--------+ Right    Rt Pressure (mmHg)IndexWaveform Comment  +---------+------------------+-----+---------+--------+ Brachial 117                    triphasic         +---------+------------------+-----+---------+--------+ PTA      109               0.89 biphasic          +---------+------------------+-----+---------+--------+ DP       130               1.07 triphasic         +---------+------------------+-----+---------+--------+ Great Toe65                0.53 Abnormal          +---------+------------------+-----+---------+--------+ +---------+------------------+-----+-----------+-------+ Left     Lt Pressure (mmHg)IndexWaveform   Comment +---------+------------------+-----+-----------+-------+ Brachial 122  triphasic          +---------+------------------+-----+-----------+-------+ PTA      121               0.99 multiphasic        +---------+------------------+-----+-----------+-------+ DP       108               0.89 monophasic         +---------+------------------+-----+-----------+-------+ Great Toe94                0.77 Abnormal           +---------+------------------+-----+-----------+-------+ +-------+-----------+-----------+------------+------------+ ABI/TBIToday's ABIToday's TBIPrevious ABIPrevious TBI  +-------+-----------+-----------+------------+------------+ Right  1.07       0.53       0.92        0.45         +-------+-----------+-----------+------------+------------+ Left   0.99       0.77       0.87        0.78         +-------+-----------+-----------+------------+------------+  Right ABIs and bilateral TBIs appear essentially unchanged compared to prior study on 04/19/2024. Left ABIs appear increased compared to prior study on 04/19/2024.  Summary: Right: Resting right ankle-brachial index is within normal range. Right toe pressure is >60 mmHg which suggests adequate perfusion for healing. Left: Resting left ankle-brachial index is within normal range. The left toe-brachial index is normal.  *See table(s) above for measurements and observations.     Preliminary    DG Foot 2 Views Left Result Date: 10/05/2024 CLINICAL DATA:  Foot infection EXAM: LEFT FOOT - 2 VIEW COMPARISON:  None Available. FINDINGS: Frontal and lateral views of the left foot are obtained on 2 images. There are no acute displaced fractures. Soft tissue defect plantar aspect of the first digit is noted, with irregularity of the underlying tuft of the first distal phalanx concerning for osteomyelitis. No periosteal reaction. No acute fractures. Hammertoe deformities are noted. IMPRESSION: 1. Soft tissue defect plantar aspect first digit, with suspected osteomyelitis involving the distal tuft of the first distal phalanx. Electronically Signed   By: Ozell Daring M.D.   On: 10/05/2024 17:02   CT CHEST ABDOMEN PELVIS W CONTRAST Result Date: 10/05/2024 EXAM: CT CHEST, ABDOMEN AND PELVIS WITH CONTRAST 10/05/2024 02:10:56 PM TECHNIQUE: CT of the chest, abdomen and pelvis was performed with the administration of intravenous contrast. 75 mL of iohexol  (OMNIPAQUE ) 350 MG/ML injection was administered. Multiplanar reformatted images are provided for review. Automated exposure control, iterative reconstruction, and/or weight based  adjustment of the mA/kV was utilized to reduce the radiation dose to as low as reasonably achievable. COMPARISON: 08/12/2023 CLINICAL HISTORY: Fever of unknown origin. FINDINGS: CHEST: MEDIASTINUM AND LYMPH NODES: Heart and pericardium are unremarkable. The central airways are clear. No mediastinal, hilar or axillary lymphadenopathy. LUNGS AND PLEURA: No focal consolidation or pulmonary edema. No pleural effusion or pneumothorax. ABDOMEN AND PELVIS: LIVER: Stable left hepatic cyst. GALLBLADDER AND BILE DUCTS: Status post cholecystectomy. No biliary ductal dilatation. SPLEEN: No acute abnormality. PANCREAS: No acute abnormality. ADRENAL GLANDS: No acute abnormality. KIDNEYS, URETERS AND BLADDER: No stones in the kidneys or ureters. No hydronephrosis. No perinephric or periureteral stranding. Urinary bladder is unremarkable. GI AND BOWEL: Stomach demonstrates no acute abnormality. There is no bowel obstruction. REPRODUCTIVE ORGANS: No acute abnormality. PERITONEUM AND RETROPERITONEUM: No ascites. No free air. VASCULATURE: Aorta is normal in caliber. Aortic atherosclerosis. ABDOMINAL AND PELVIS LYMPH NODES: No lymphadenopathy. BONES AND SOFT TISSUES: Status post  right hip arthroplasty. No acute osseous abnormality. No focal soft tissue abnormality. IMPRESSION: 1. No acute abnormality of the chest, abdomen, or pelvis. Electronically signed by: Lynwood Seip MD 10/05/2024 02:40 PM EST RP Workstation: [id number]   DG Chest 2 View Result Date: 10/05/2024 EXAM: 2 VIEW(S) XRAY OF THE CHEST 10/05/2024 09:44:00 AM COMPARISON: 08/12/2023 CLINICAL HISTORY: 89 year old male with cough. FINDINGS: LINES, TUBES AND DEVICES: EKG leads are noted and appear in appropriate position. LUNGS AND PLEURA: Lower volume chest. No focal pulmonary opacity. No pleural effusion. No pneumothorax. HEART AND MEDIASTINUM: No acute abnormality of the cardiac and mediastinal silhouettes. BONES AND SOFT TISSUES: No acute osseous abnormality. IMPRESSION:  1. No acute cardiopulmonary abnormality. Electronically signed by: Helayne Hurst MD 10/05/2024 09:59 AM EST RP Workstation: [id number]   CT Head Wo Contrast Result Date: 10/05/2024 EXAM: CT HEAD WITHOUT CONTRAST 10/05/2024 09:52:07 AM TECHNIQUE: CT of the head was performed without the administration of intravenous contrast. Automated exposure control, iterative reconstruction, and/or weight based adjustment of the mA/kV was utilized to reduce the radiation dose to as low as reasonably achievable. COMPARISON: Head CT 08/12/2023. CLINICAL HISTORY: 89 year old male. Delirium. FINDINGS: BRAIN AND VENTRICLES: No acute hemorrhage. No evidence of acute infarct. No hydrocephalus. No extra-axial collection. No mass effect or midline shift. Brain volume is stable and normal for age. Gray white differentiation stable and within normal limits for age. No suspicious intracranial vascular hyperdensity. ORBITS: No acute abnormality. SINUSES: Mild bilateral paranasal sinus mucosal thickening is stable. Tympanic cavities and mastoids appear clear. SOFT TISSUES AND SKULL: No acute soft tissue abnormality. No skull fracture. Hyperostosis frontalis, normal variant. Calcified atherosclerosis at the skull base. IMPRESSION: 1. Stable and negative for age non-contrast CT appearance of the brain. Electronically signed by: Helayne Hurst MD 10/05/2024 09:58 AM EST RP Workstation: [id number]     Discharge Instructions: Discharge Instructions     Call MD for:  difficulty breathing, headache or visual disturbances   Complete by: As directed    Call MD for:  persistant dizziness or light-headedness   Complete by: As directed    Call MD for:  persistant nausea and vomiting   Complete by: As directed    Call MD for:  redness, tenderness, or signs of infection (pain, swelling, redness, odor or green/yellow discharge around incision site)   Complete by: As directed    Call MD for:  temperature >100.4   Complete by: As directed    Diet  - low sodium heart healthy   Complete by: As directed    Discharge instructions   Complete by: As directed    Thank you for allowing us  to be part of your care. You were hospitalized for a fever.   See the changes in your medications and management of your chronic conditions below:  *For your toe wound/infection -We have STARTED you on these following medications:  -Augmentin --take 1 tablet every 12 hours for 7 days  -Doxycycline --Take 1 tablet every 12 hours for 7 days  *For your Hiccups  -We have STARTED you on these following medications:  -Thorazine --take 1 tablet up to 3 times a day as needed for hiccups for 5 days  *For your A-fib  -We have STOPPED you on these following medications:  -Eliquis --we have stopped this medication due to bleeding risks in your age. It is no longer recommended.   -IF you were to restart it, you should be on 2.5mg  BID due to your age (>80yo)   -Please have the facility help schedule  a podiatry appointment -Please see your PCP in 7 to 10 days  FOLLOW UP APPOINTMENTS:  Please make sure to finish your antibiotics  Please call your PCP or our clinic if you have any questions or concerns, we may be able to help and keep you from a long and expensive emergency room wait. Our clinic and after hours phone number is [phone]. The best time to call is Monday through Friday 9 am to 4 pm but there is always someone available 24/7 if you have an emergency. If you need medication refills please notify your pharmacy one week in advance and they will send us  a request.   We are glad you are feeling better,  [name] Novak, DO Internal Medicine Inpatient Teaching Service at [facility]   Increase activity slowly   Complete by: As directed        Signed: Novak Schuyler, DO 10/06/2024, 12:30 PM

## 2024-10-06 NOTE — Progress Notes (Signed)
 Nurse, Braden, from [facility] called wanting update on patient. Informed her that patient would be discharging today and report given to her. All questions answered.

## 2024-10-06 NOTE — TOC Transition Note (Signed)
 Transition of Care [facility]) - Discharge Note   Patient Details  Name: [name] [name] MRN: [id number] Date of Birth: Nov 25, 1931  Transition of [facility] CM/SW Contact:  Ermalinda Penton [place], [place] Phone Number: 10/06/2024, 4:16 PM   Clinical Narrative:     VM left for patient's son to inform him of patient's return to [facility]. Left a voicemail requesting a return call. Did reach patient's nephew listed in chart, informed him of patient's return to the facility today.  Tiarna Koppen, LCSW Transition of Care          Patient Goals and CMS Choice            Discharge Placement                       Discharge Plan and Services Additional resources added to the After Visit Summary for                                       Social Drivers of Health (SDOH) Interventions SDOH Screenings   Food Insecurity: No Food Insecurity (07/21/2024)  Housing: Low Risk  (07/21/2024)  Transportation Needs: No Transportation Needs (07/21/2024)  Utilities: Not At Risk (07/21/2024)  Social Connections: Moderately Integrated (07/21/2024)  Tobacco Use: Medium Risk (10/06/2024)     Readmission Risk Interventions     No data to display

## 2024-10-09 ENCOUNTER — Encounter: Payer: Self-pay | Admitting: Physician Assistant

## 2024-10-10 LAB — CULTURE, BLOOD (SINGLE): Culture: NO GROWTH

## 2024-10-10 LAB — CULTURE, BLOOD (ROUTINE X 2)
Culture: NO GROWTH
Culture: NO GROWTH

## 2024-10-15 ENCOUNTER — Ambulatory Visit: Admitting: Podiatry

## 2024-10-16 ENCOUNTER — Ambulatory Visit: Admitting: Podiatry

## 2024-10-16 ENCOUNTER — Encounter: Payer: Self-pay | Admitting: Podiatry

## 2024-10-16 DIAGNOSIS — I739 Peripheral vascular disease, unspecified: Secondary | ICD-10-CM

## 2024-10-16 DIAGNOSIS — L97522 Non-pressure chronic ulcer of other part of left foot with fat layer exposed: Secondary | ICD-10-CM

## 2024-10-16 NOTE — Progress Notes (Signed)
 Subjective:  Patient ID: [name] [name], male    DOB: Jun 26, 1932,   MRN: [id number]  Chief Complaint  Patient presents with   Wound Check    It's not good.    89 y.o. male presents for follow-up of left hallux wound. Has not followed up since 9/10.  History of revascularization with Dr. Silver.   . Denies any other pedal complaints. Denies diabetes. Denies n/v/f/c.   Past Medical History:  Diagnosis Date   B12 deficiency    Chronic heart failure with preserved ejection fraction (HFpEF) (HCC)    Falls    Hypertension    Hypothyroidism    Iron deficiency anemia    Peripheral vascular disease    Prostate cancer (HCC)    Trifascicular block     Objective:  Physical Exam: Vascular: DP/PT pulses 2/4 bilateral. CFT <3 seconds. Normal hair growth on digits. No edema.  Skin. No lacerations or abrasions bilateral feet. Left hallux plantar distal wound with hyperkeratosis surroudning and granular base. No erythema edema or purulence noted. Measurements below. No probe to bone.  Musculoskeletal: MMT 5/5 bilateral lower extremities in DF, PF, Inversion and Eversion. Deceased ROM in DF of ankle joint.  Neurological: Sensation intact to light touch.   Assessment:   1. Skin ulcer of left great toe with fat layer exposed (HCC)   2. PAD (peripheral artery disease)           Plan:  Patient was evaluated and treated and all questions answered. Ulcer plantar left hallux with fat layer exposed   -Debridement as below. -Dressed with betadine, DSD. -Off-loading with regular shoe. Has not been wearing surgical shoe and concern for fall risk. Padding added to current shoes.  -No abx indicated.  -Discussed glucose control and proper protein-rich diet.  -Discussed if any worsening redness, pain, fever or chills to call or may need to report to the emergency room. Patient expressed understanding.    Procedure: Excisional Debridement of Wound Tool: Sharp #312 chisel blade/tissue  nipper Type of Debridement: Sharp Excisional Frequency: Every two weeks until appropriately healed.  Dressing is to be changed daily/keeping the wound clean and dry Rationale: Removal of non-viable soft tissue from the wound to promote healing.  Anesthesia: none Pre-Debridement Wound Measurements: Overlying callu s Post-Debridement Wound Measurements: 0.5 cm x 0.2 cm x 0.2 cm  Area devitalized tissue removed(nonviable tissue only): 0.6 cm x 0.4 cm.  Blood loss: Minimal (<10cc) Depth of Debridement: with fat layer exposed Description of tissue removed: Devitalized Tissue and Other: callus Technique: The wound and the surrounding skin were prepped and draped in usual aseptic fashion.  Aseptic technique was maintained throughout the procedure.  Using #312 blade/tissue nipper sharp debridement of necrotic/nonviable tissue was performed until healthy bleeding wound bed was achieved.  No underlying bone or tendon was exposed during debridement.  The wound was thoroughly irrigated with normal saline solution Wound Progress:  Current Wound Volume: Debridement was performed of the chronic nonhealing diabetic foot wound on plantar left hallux .  Debridement removed 0.6 cm x 0.4 cm of the necrotic tissue and subcutaneous tissue and  purulent drainage was not present. Presence/absence of tissue: Necrotic tissue/nonviable tissue present at the base of the wound.  Sharp debridement was performed to remove the necrotic tissue/nonviable tissue back to viable tissue.  No devitalized/nonviable tissue present postdebridement.  Wound appeared clean and clear of infection No material in the wound was present that was identified to be inhibiting healing. Dressing: Dry, sterile, compression dressing.  Disposition: Patient tolerated procedure well. Patient to return in 2 weeks for follow-up or as listed above.  No follow-ups on file.   No follow-ups on file.   Asberry Failing, DPM

## 2024-10-30 ENCOUNTER — Encounter: Payer: Self-pay | Admitting: Podiatry

## 2024-10-30 ENCOUNTER — Ambulatory Visit (INDEPENDENT_AMBULATORY_CARE_PROVIDER_SITE_OTHER): Admitting: Podiatry

## 2024-10-30 DIAGNOSIS — I739 Peripheral vascular disease, unspecified: Secondary | ICD-10-CM | POA: Diagnosis not present

## 2024-10-30 DIAGNOSIS — L97522 Non-pressure chronic ulcer of other part of left foot with fat layer exposed: Secondary | ICD-10-CM

## 2024-10-30 NOTE — Progress Notes (Signed)
 "  Subjective:  Patient ID: [name] [name], male    DOB: 12-03-1931,   MRN: [id number]  Chief Complaint  Patient presents with   Foot Ulcer    Rm22 Follow up left great toe ulceration/ no pain     89 y.o. male presents for follow-up of left hallux wound.  Family members present today relates they have been keeping it wrapped at the facility.  History of revascularization with Dr. Silver.   . Denies any other pedal complaints. Denies diabetes. Denies n/v/f/c.   Past Medical History:  Diagnosis Date   B12 deficiency    Chronic heart failure with preserved ejection fraction (HFpEF) (HCC)    Falls    Hypertension    Hypothyroidism    Iron deficiency anemia    Peripheral vascular disease    Prostate cancer (HCC)    Trifascicular block     Objective:  Physical Exam: Vascular: DP/PT pulses 2/4 bilateral. CFT <3 seconds. Normal hair growth on digits. No edema.  Skin. No lacerations or abrasions bilateral feet. Left hallux plantar distal wound with hyperkeratosis surroudning and granular base. No erythema edema or purulence noted. Measurements below. No probe to bone.  Musculoskeletal: MMT 5/5 bilateral lower extremities in DF, PF, Inversion and Eversion. Deceased ROM in DF of ankle joint.  Neurological: Sensation intact to light touch.   Assessment:   1. Skin ulcer of left great toe with fat layer exposed (HCC)   2. PAD (peripheral artery disease)           Plan:  Patient was evaluated and treated and all questions answered. Ulcer plantar left hallux with fat layer exposed   -Debridement as below. -Dressed with betadine, DSD. -Off-loading with regular shoe. Has not been wearing surgical shoe and concern for fall risk. Padding added to current shoes.  -No abx indicated.  -Discussed glucose control and proper protein-rich diet.  -Discussed if any worsening redness, pain, fever or chills to call or may need to report to the emergency room. Patient expressed understanding.     Procedure: Excisional Debridement of Wound Tool: Sharp #312 chisel blade/tissue nipper Type of Debridement: Sharp Excisional Frequency: Every two weeks until appropriately healed.  Dressing is to be changed daily/keeping the wound clean and dry Rationale: Removal of non-viable soft tissue from the wound to promote healing.  Anesthesia: none Pre-Debridement Wound Measurements: Overlying callu s Post-Debridement Wound Measurements: 0.3 cm x 0.2 cm x 0.2 cm  Area devitalized tissue removed(nonviable tissue only): 0.5 cm x 0.4 cm.  Blood loss: Minimal (<10cc) Depth of Debridement: with fat layer exposed Description of tissue removed: Devitalized Tissue and Other: callus Technique: The wound and the surrounding skin were prepped and draped in usual aseptic fashion.  Aseptic technique was maintained throughout the procedure.  Using #312 blade/tissue nipper sharp debridement of necrotic/nonviable tissue was performed until healthy bleeding wound bed was achieved.  No underlying bone or tendon was exposed during debridement.  The wound was thoroughly irrigated with normal saline solution Wound Progress:  Current Wound Volume: Debridement was performed of the chronic nonhealing diabetic foot wound on plantar left hallux .  Debridement removed 0.5 cm x 0.4 cm of the necrotic tissue and subcutaneous tissue and  purulent drainage was not present. Presence/absence of tissue: Necrotic tissue/nonviable tissue present at the base of the wound.  Sharp debridement was performed to remove the necrotic tissue/nonviable tissue back to viable tissue.  No devitalized/nonviable tissue present postdebridement.  Wound appeared clean and clear of infection No  material in the wound was present that was identified to be inhibiting healing. Dressing: Dry, sterile, compression dressing. Disposition: Patient tolerated procedure well. Patient to return in 2 weeks for follow-up or as listed above.  Return in about 2 weeks  (around 11/13/2024) for wound check.   Return in about 2 weeks (around 11/13/2024) for wound check.   Asberry Failing, DPM    "

## 2024-11-13 ENCOUNTER — Ambulatory Visit: Admitting: Podiatry

## 2024-11-14 ENCOUNTER — Ambulatory Visit: Admitting: Podiatry

## 2024-11-14 ENCOUNTER — Encounter: Payer: Self-pay | Admitting: Podiatry

## 2024-11-14 DIAGNOSIS — L97522 Non-pressure chronic ulcer of other part of left foot with fat layer exposed: Secondary | ICD-10-CM

## 2024-11-14 DIAGNOSIS — I739 Peripheral vascular disease, unspecified: Secondary | ICD-10-CM | POA: Diagnosis not present

## 2024-11-14 NOTE — Progress Notes (Signed)
 "  Subjective:  Patient ID: [name] [name], male    DOB: September 16, 1932,   MRN: [id number]  Chief Complaint  Patient presents with   Wound Check    It feels like needles are sticking in it.  Sometime it feels like it's burning.    89 y.o. male presents for follow-up of left hallux wound.  History of revascularization with Dr. Silver.   . Denies any other pedal complaints. Denies diabetes. Denies n/v/f/c.   Past Medical History:  Diagnosis Date   B12 deficiency    Chronic heart failure with preserved ejection fraction (HFpEF) (HCC)    Falls    Hypertension    Hypothyroidism    Iron deficiency anemia    Peripheral vascular disease    Prostate cancer (HCC)    Trifascicular block     Objective:  Physical Exam: Vascular: DP/PT pulses 2/4 bilateral. CFT <3 seconds. Normal hair growth on digits. No edema.  Skin. No lacerations or abrasions bilateral feet. Left hallux plantar distal wound with hyperkeratosis surroudning and granular base. No erythema edema or purulence noted. Measurements below. No probe to bone.  Musculoskeletal: MMT 5/5 bilateral lower extremities in DF, PF, Inversion and Eversion. Deceased ROM in DF of ankle joint.  Neurological: Sensation intact to light touch.   Assessment:   1. Skin ulcer of left great toe with fat layer exposed (HCC)   2. PAD (peripheral artery disease)           Plan:  Patient was evaluated and treated and all questions answered. Ulcer plantar left hallux with fat layer exposed   -Debridement as below. -Dressed with betadine, DSD. -Off-loading with regular shoe. Has not been wearing surgical shoe and concern for fall risk. Padding added to current shoes.  -No abx indicated.  -Discussed glucose control and proper protein-rich diet.  -Discussed if any worsening redness, pain, fever or chills to call or may need to report to the emergency room. Patient expressed understanding.  - Discussed wound has not progressed and several visits and  would like to consider further treatments.  Will refer over to wound care to see if hyperbaric oxygen therapy may be a viable option for him. Return in 2 weeks for recheck until he can get into wound care  Procedure: Excisional Debridement of Wound Tool: Sharp #312 chisel blade/tissue nipper Type of Debridement: Sharp Excisional Frequency: Every two weeks until appropriately healed.  Dressing is to be changed daily/keeping the wound clean and dry Rationale: Removal of non-viable soft tissue from the wound to promote healing.  Anesthesia: none Pre-Debridement Wound Measurements: Overlying callu s Post-Debridement Wound Measurements: 0.3 cm x 0.4 cm x 0.2 cm  Area devitalized tissue removed(nonviable tissue only): 0.5 cm x 0.4 cm.  Blood loss: Minimal (<10cc) Depth of Debridement: with fat layer exposed Description of tissue removed: Devitalized Tissue and Other: callus Technique: The wound and the surrounding skin were prepped and draped in usual aseptic fashion.  Aseptic technique was maintained throughout the procedure.  Using #312 blade/tissue nipper sharp debridement of necrotic/nonviable tissue was performed until healthy bleeding wound bed was achieved.  No underlying bone or tendon was exposed during debridement.  The wound was thoroughly irrigated with normal saline solution Wound Progress:  Current Wound Volume: Debridement was performed of the chronic nonhealing diabetic foot wound on plantar left hallux .  Debridement removed 0.5 cm x 0.4 cm of the necrotic tissue and subcutaneous tissue and  purulent drainage was not present. Presence/absence of tissue: Necrotic tissue/nonviable  tissue present at the base of the wound.  Sharp debridement was performed to remove the necrotic tissue/nonviable tissue back to viable tissue.  No devitalized/nonviable tissue present postdebridement.  Wound appeared clean and clear of infection No material in the wound was present that was identified to be  inhibiting healing. Dressing: Dry, sterile, compression dressing. Disposition: Patient tolerated procedure well. Patient to return in 2 weeks for follow-up or as listed above.  Return in about 2 weeks (around 11/28/2024) for wound check.   Return in about 2 weeks (around 11/28/2024) for wound check.   Asberry Failing, DPM    "

## 2024-11-28 ENCOUNTER — Ambulatory Visit: Admitting: Podiatry

## 2024-11-28 ENCOUNTER — Encounter: Payer: Self-pay | Admitting: Podiatry

## 2024-11-28 DIAGNOSIS — L97522 Non-pressure chronic ulcer of other part of left foot with fat layer exposed: Secondary | ICD-10-CM | POA: Diagnosis not present

## 2024-11-28 DIAGNOSIS — I739 Peripheral vascular disease, unspecified: Secondary | ICD-10-CM | POA: Diagnosis not present

## 2024-11-28 NOTE — Progress Notes (Signed)
 "  Subjective:  Patient ID: [name] [name], male    DOB: [birth year],   MRN: [id number]  Chief Complaint  Patient presents with   Wound Check    It still stings  a little bit.    89 y.o. male presents for follow-up of left hallux wound.  History of revascularization with Dr. Silver.   . Denies any other pedal complaints. Denies diabetes. Denies n/v/f/c.   Past Medical History:  Diagnosis Date   B12 deficiency    Chronic heart failure with preserved ejection fraction (HFpEF) (HCC)    Falls    Hypertension    Hypothyroidism    Iron deficiency anemia    Peripheral vascular disease    Prostate cancer (HCC)    Trifascicular block     Objective:  Physical Exam: Vascular: DP/PT pulses 2/4 bilateral. CFT <3 seconds. Normal hair growth on digits. No edema.  Skin. No lacerations or abrasions bilateral feet. Left hallux plantar distal wound with hyperkeratosis surroudning and granular base. No erythema edema or purulence noted. Measurements below. No probe to bone.  Musculoskeletal: MMT 5/5 bilateral lower extremities in DF, PF, Inversion and Eversion. Deceased ROM in DF of ankle joint.  Neurological: Sensation intact to light touch.   Assessment:   1. Skin ulcer of left great toe with fat layer exposed (HCC)   2. PAD (peripheral artery disease)           Plan:  Patient was evaluated and treated and all questions answered. Ulcer plantar left hallux with fat layer exposed   -Debridement as below. -Dressed with betadine, DSD. -Off-loading with regular shoe. Has not been wearing surgical shoe and concern for fall risk. Padding added to current shoes.  -No abx indicated.  -Discussed glucose control and proper protein-rich diet.  -Discussed if any worsening redness, pain, fever or chills to call or may need to report to the emergency room. Patient expressed understanding.  - Appointment 2/9 for  wound care to see if hyperbaric oxygen therapy may be a viable option for  him.  Procedure: Excisional Debridement of Wound Tool: Sharp #312 chisel blade/tissue nipper Type of Debridement: Sharp Excisional Frequency: Every two weeks until appropriately healed.  Dressing is to be changed daily/keeping the wound clean and dry Rationale: Removal of non-viable soft tissue from the wound to promote healing.  Anesthesia: none Pre-Debridement Wound Measurements: Overlying callu s Post-Debridement Wound Measurements: 0.3 cm x 0.3 cm x 0.2 cm  Area devitalized tissue removed(nonviable tissue only): 0.3 cm x 0.4 cm.  Blood loss: Minimal (<10cc) Depth of Debridement: with fat layer exposed Description of tissue removed: Devitalized Tissue and Other: callus Technique: The wound and the surrounding skin were prepped and draped in usual aseptic fashion.  Aseptic technique was maintained throughout the procedure.  Using #312 blade/tissue nipper sharp debridement of necrotic/nonviable tissue was performed until healthy bleeding wound bed was achieved.  No underlying bone or tendon was exposed during debridement.  The wound was thoroughly irrigated with normal saline solution Wound Progress:  Current Wound Volume: Debridement was performed of the chronic nonhealing diabetic foot wound on plantar left hallux .  Debridement removed 0.3 cm x 0.4 cm of the necrotic tissue and subcutaneous tissue and  purulent drainage was not present. Presence/absence of tissue: Necrotic tissue/nonviable tissue present at the base of the wound.  Sharp debridement was performed to remove the necrotic tissue/nonviable tissue back to viable tissue.  No devitalized/nonviable tissue present postdebridement.  Wound appeared clean and clear of infection  No material in the wound was present that was identified to be inhibiting healing. Dressing: Dry, sterile, compression dressing. Disposition: Patient tolerated procedure well. Patient to return in 2 weeks for follow-up or as listed above.  Return if symptoms  worsen or fail to improve.   Return if symptoms worsen or fail to improve.   Asberry Failing, DPM    "

## 2024-12-10 ENCOUNTER — Encounter (HOSPITAL_BASED_OUTPATIENT_CLINIC_OR_DEPARTMENT_OTHER): Admitting: Internal Medicine
